# Patient Record
Sex: Female | Born: 1970
Health system: Southern US, Community
[De-identification: ages and names within clinical notes are randomized; demographics above are authoritative.]

## PROBLEM LIST (undated history)

## (undated) DIAGNOSIS — M797 Fibromyalgia: Secondary | ICD-10-CM

## (undated) DIAGNOSIS — D649 Anemia, unspecified: Secondary | ICD-10-CM

## (undated) DIAGNOSIS — K219 Gastro-esophageal reflux disease without esophagitis: Secondary | ICD-10-CM

## (undated) DIAGNOSIS — E785 Hyperlipidemia, unspecified: Secondary | ICD-10-CM

## (undated) DIAGNOSIS — IMO0002 Reserved for concepts with insufficient information to code with codable children: Secondary | ICD-10-CM

## (undated) DIAGNOSIS — Z9889 Other specified postprocedural states: Secondary | ICD-10-CM

## (undated) DIAGNOSIS — F502 Bulimia nervosa, unspecified: Secondary | ICD-10-CM

## (undated) DIAGNOSIS — R112 Nausea with vomiting, unspecified: Secondary | ICD-10-CM

## (undated) HISTORY — PX: NECK SURGERY: SHX720

## (undated) HISTORY — DX: Other specified postprocedural states: Z98.890

## (undated) HISTORY — DX: Hyperlipidemia, unspecified: E78.5

## (undated) HISTORY — PX: OTHER SURGICAL HISTORY: SHX169

## (undated) HISTORY — DX: Fibromyalgia: M79.7

## (undated) HISTORY — DX: Bulimia nervosa: F50.2

## (undated) HISTORY — PX: ROTATOR CUFF REPAIR: SHX139

## (undated) HISTORY — DX: Gastro-esophageal reflux disease without esophagitis: K21.9

## (undated) HISTORY — DX: Reserved for concepts with insufficient information to code with codable children: IMO0002

## (undated) HISTORY — DX: Anemia, unspecified: D64.9

## (undated) HISTORY — PX: ABDOMINAL HYSTERECTOMY: SHX81

## (undated) HISTORY — DX: Bulimia nervosa, unspecified: F50.20

## (undated) HISTORY — DX: Other specified postprocedural states: R11.2

## (undated) SURGERY — Surgical Case
Anesthesia: *Unknown

---

## 2003-01-17 ENCOUNTER — Encounter: Payer: Self-pay | Admitting: *Deleted

## 2003-01-17 ENCOUNTER — Encounter: Admission: RE | Admit: 2003-01-17 | Discharge: 2003-01-17 | Payer: Self-pay | Admitting: *Deleted

## 2003-06-17 ENCOUNTER — Ambulatory Visit (HOSPITAL_COMMUNITY): Admission: RE | Admit: 2003-06-17 | Discharge: 2003-06-17 | Payer: Self-pay | Admitting: Family Medicine

## 2003-07-08 ENCOUNTER — Ambulatory Visit (HOSPITAL_COMMUNITY): Admission: RE | Admit: 2003-07-08 | Discharge: 2003-07-08 | Payer: Self-pay | Admitting: Family Medicine

## 2003-08-07 ENCOUNTER — Ambulatory Visit (HOSPITAL_COMMUNITY): Admission: RE | Admit: 2003-08-07 | Discharge: 2003-08-07 | Payer: Self-pay | Admitting: Family Medicine

## 2004-01-21 ENCOUNTER — Encounter (INDEPENDENT_AMBULATORY_CARE_PROVIDER_SITE_OTHER): Payer: Self-pay | Admitting: Cardiology

## 2004-01-21 ENCOUNTER — Ambulatory Visit (HOSPITAL_COMMUNITY): Admission: RE | Admit: 2004-01-21 | Discharge: 2004-01-21 | Payer: Self-pay | Admitting: Cardiology

## 2004-02-02 ENCOUNTER — Ambulatory Visit (HOSPITAL_COMMUNITY): Admission: RE | Admit: 2004-02-02 | Discharge: 2004-02-02 | Payer: Self-pay | Admitting: Family Medicine

## 2004-03-19 ENCOUNTER — Ambulatory Visit (HOSPITAL_COMMUNITY): Admission: RE | Admit: 2004-03-19 | Discharge: 2004-03-19 | Payer: Self-pay | Admitting: Obstetrics and Gynecology

## 2004-08-17 ENCOUNTER — Other Ambulatory Visit: Admission: RE | Admit: 2004-08-17 | Discharge: 2004-08-17 | Payer: Self-pay | Admitting: Obstetrics and Gynecology

## 2005-03-08 ENCOUNTER — Encounter (INDEPENDENT_AMBULATORY_CARE_PROVIDER_SITE_OTHER): Payer: Self-pay | Admitting: *Deleted

## 2005-03-08 ENCOUNTER — Observation Stay (HOSPITAL_COMMUNITY): Admission: RE | Admit: 2005-03-08 | Discharge: 2005-03-09 | Payer: Self-pay | Admitting: Obstetrics and Gynecology

## 2007-02-27 ENCOUNTER — Ambulatory Visit: Payer: Self-pay | Admitting: Psychiatry

## 2007-03-27 ENCOUNTER — Ambulatory Visit: Payer: Self-pay | Admitting: Psychiatry

## 2007-05-10 ENCOUNTER — Ambulatory Visit: Payer: Self-pay | Admitting: Psychiatry

## 2007-05-16 ENCOUNTER — Ambulatory Visit: Payer: Self-pay | Admitting: Psychiatry

## 2007-06-06 ENCOUNTER — Ambulatory Visit: Payer: Self-pay | Admitting: Psychiatry

## 2007-06-28 ENCOUNTER — Ambulatory Visit: Payer: Self-pay | Admitting: Psychiatry

## 2007-07-19 ENCOUNTER — Ambulatory Visit: Payer: Self-pay | Admitting: Psychiatry

## 2007-08-01 ENCOUNTER — Ambulatory Visit: Payer: Self-pay | Admitting: Psychiatry

## 2007-08-16 HISTORY — PX: PAROTIDECTOMY: SUR1003

## 2007-09-06 ENCOUNTER — Ambulatory Visit: Payer: Self-pay | Admitting: Psychiatry

## 2007-10-26 ENCOUNTER — Ambulatory Visit (HOSPITAL_COMMUNITY): Admission: RE | Admit: 2007-10-26 | Discharge: 2007-10-26 | Payer: Self-pay | Admitting: Obstetrics and Gynecology

## 2007-11-21 ENCOUNTER — Ambulatory Visit (HOSPITAL_COMMUNITY): Admission: RE | Admit: 2007-11-21 | Discharge: 2007-11-21 | Payer: Self-pay | Admitting: *Deleted

## 2008-02-20 ENCOUNTER — Encounter: Admission: RE | Admit: 2008-02-20 | Discharge: 2008-02-20 | Payer: Self-pay | Admitting: Gastroenterology

## 2009-09-08 ENCOUNTER — Ambulatory Visit: Payer: Self-pay | Admitting: Professional

## 2009-09-15 ENCOUNTER — Ambulatory Visit: Payer: Self-pay | Admitting: Professional

## 2009-09-29 ENCOUNTER — Ambulatory Visit: Payer: Self-pay | Admitting: Professional

## 2009-12-04 ENCOUNTER — Encounter: Admission: RE | Admit: 2009-12-04 | Discharge: 2009-12-04 | Payer: Self-pay | Admitting: Orthopedic Surgery

## 2009-12-16 ENCOUNTER — Encounter: Payer: Self-pay | Admitting: Family Medicine

## 2010-01-15 ENCOUNTER — Encounter: Admission: RE | Admit: 2010-01-15 | Discharge: 2010-01-15 | Payer: Self-pay | Admitting: Orthopedic Surgery

## 2010-01-22 ENCOUNTER — Ambulatory Visit: Payer: Self-pay | Admitting: Family Medicine

## 2010-01-22 DIAGNOSIS — M5126 Other intervertebral disc displacement, lumbar region: Secondary | ICD-10-CM | POA: Insufficient documentation

## 2010-01-22 DIAGNOSIS — R5381 Other malaise: Secondary | ICD-10-CM | POA: Insufficient documentation

## 2010-01-22 DIAGNOSIS — Z8659 Personal history of other mental and behavioral disorders: Secondary | ICD-10-CM | POA: Insufficient documentation

## 2010-01-22 DIAGNOSIS — G4700 Insomnia, unspecified: Secondary | ICD-10-CM | POA: Insufficient documentation

## 2010-01-22 DIAGNOSIS — A6000 Herpesviral infection of urogenital system, unspecified: Secondary | ICD-10-CM | POA: Insufficient documentation

## 2010-01-22 DIAGNOSIS — K219 Gastro-esophageal reflux disease without esophagitis: Secondary | ICD-10-CM | POA: Insufficient documentation

## 2010-01-22 DIAGNOSIS — R5383 Other fatigue: Secondary | ICD-10-CM | POA: Insufficient documentation

## 2010-01-25 LAB — CONVERTED CEMR LAB
ALT: 19 units/L (ref 0–35)
AST: 16 units/L (ref 0–37)
Albumin: 3.6 g/dL (ref 3.5–5.2)
Alkaline Phosphatase: 73 units/L (ref 39–117)
BUN: 14 mg/dL (ref 6–23)
Basophils Absolute: 0 10*3/uL (ref 0.0–0.1)
Basophils Relative: 0.3 % (ref 0.0–3.0)
Bilirubin, Direct: 0.1 mg/dL (ref 0.0–0.3)
CO2: 32 meq/L (ref 19–32)
Calcium: 9.2 mg/dL (ref 8.4–10.5)
Chloride: 102 meq/L (ref 96–112)
Cholesterol: 275 mg/dL — ABNORMAL HIGH (ref 0–200)
Creatinine, Ser: 0.9 mg/dL (ref 0.4–1.2)
Direct LDL: 181.9 mg/dL
Eosinophils Absolute: 0.3 10*3/uL (ref 0.0–0.7)
Eosinophils Relative: 2.2 % (ref 0.0–5.0)
GFR calc non Af Amer: 76.18 mL/min (ref >60)
Glucose, Bld: 80 mg/dL (ref 70–99)
HCT: 38.3 % (ref 36.0–46.0)
HDL: 50.4 mg/dL (ref >39.00)
Hemoglobin: 13.6 g/dL (ref 12.0–15.0)
Lymphocytes Relative: 27.7 % (ref 12.0–46.0)
Lymphs Abs: 3.6 10*3/uL (ref 0.7–4.0)
MCHC: 35.4 g/dL (ref 30.0–36.0)
MCV: 93.2 fL (ref 78.0–100.0)
Monocytes Absolute: 0.8 10*3/uL (ref 0.1–1.0)
Monocytes Relative: 6.2 % (ref 3.0–12.0)
Neutro Abs: 8.2 10*3/uL — ABNORMAL HIGH (ref 1.4–7.7)
Neutrophils Relative %: 63.6 % (ref 43.0–77.0)
Platelets: 389 10*3/uL (ref 150.0–400.0)
Potassium: 4.3 meq/L (ref 3.5–5.1)
RBC: 4.11 M/uL (ref 3.87–5.11)
RDW: 12.5 % (ref 11.5–14.6)
Sodium: 141 meq/L (ref 135–145)
T3 Uptake Ratio: 39.5 % — ABNORMAL HIGH (ref 22.5–37.0)
T4, Total: 6.7 ug/dL (ref 5.0–12.5)
TSH: 0.73 microintl units/mL (ref 0.35–5.50)
Total Bilirubin: 0.4 mg/dL (ref 0.3–1.2)
Total CHOL/HDL Ratio: 5
Total Protein: 7 g/dL (ref 6.0–8.3)
Triglycerides: 302 mg/dL — ABNORMAL HIGH (ref 0.0–149.0)
VLDL: 60.4 mg/dL — ABNORMAL HIGH (ref 0.0–40.0)
WBC: 12.9 10*3/uL — ABNORMAL HIGH (ref 4.5–10.5)

## 2010-02-09 ENCOUNTER — Ambulatory Visit: Payer: Self-pay | Admitting: Family Medicine

## 2010-02-09 DIAGNOSIS — R55 Syncope and collapse: Secondary | ICD-10-CM | POA: Insufficient documentation

## 2010-02-09 DIAGNOSIS — J020 Streptococcal pharyngitis: Secondary | ICD-10-CM | POA: Insufficient documentation

## 2010-02-09 LAB — CONVERTED CEMR LAB: Rapid Strep: POSITIVE

## 2010-02-12 ENCOUNTER — Telehealth: Payer: Self-pay | Admitting: Family Medicine

## 2010-02-16 ENCOUNTER — Encounter: Payer: Self-pay | Admitting: Family Medicine

## 2010-02-26 ENCOUNTER — Encounter: Admission: RE | Admit: 2010-02-26 | Discharge: 2010-02-26 | Payer: Self-pay | Admitting: Orthopedic Surgery

## 2010-03-02 ENCOUNTER — Emergency Department (HOSPITAL_COMMUNITY): Admission: EM | Admit: 2010-03-02 | Discharge: 2010-03-03 | Payer: Self-pay | Admitting: Emergency Medicine

## 2010-05-05 ENCOUNTER — Ambulatory Visit: Payer: Self-pay | Admitting: Family Medicine

## 2010-05-05 DIAGNOSIS — E785 Hyperlipidemia, unspecified: Secondary | ICD-10-CM | POA: Insufficient documentation

## 2010-05-06 LAB — CONVERTED CEMR LAB
ALT: 16 units/L (ref 0–35)
AST: 19 units/L (ref 0–37)
Albumin: 3.6 g/dL (ref 3.5–5.2)
Alkaline Phosphatase: 72 units/L (ref 39–117)
Bilirubin, Direct: 0.1 mg/dL (ref 0.0–0.3)
Cholesterol: 247 mg/dL — ABNORMAL HIGH (ref 0–200)
Direct LDL: 183 mg/dL
HDL: 37.7 mg/dL — ABNORMAL LOW (ref >39.00)
Total Bilirubin: 0.4 mg/dL (ref 0.3–1.2)
Total CHOL/HDL Ratio: 7
Total Protein: 6.9 g/dL (ref 6.0–8.3)
Triglycerides: 251 mg/dL — ABNORMAL HIGH (ref 0.0–149.0)
VLDL: 50.2 mg/dL — ABNORMAL HIGH (ref 0.0–40.0)

## 2010-05-10 ENCOUNTER — Ambulatory Visit: Payer: Self-pay | Admitting: Family Medicine

## 2010-05-12 ENCOUNTER — Encounter: Payer: Self-pay | Admitting: Family Medicine

## 2010-05-12 LAB — CONVERTED CEMR LAB
BUN: 11 mg/dL (ref 6–23)
CO2: 30 meq/L (ref 19–32)
Calcium: 9.2 mg/dL (ref 8.4–10.5)
Chloride: 105 meq/L (ref 96–112)
Creatinine, Ser: 0.8 mg/dL (ref 0.4–1.2)
GFR calc non Af Amer: 83.69 mL/min (ref >60)
Glucose, Bld: 85 mg/dL (ref 70–99)
Hgb A1c MFr Bld: 6 % (ref 4.6–6.5)
Potassium: 4.2 meq/L (ref 3.5–5.1)
Sodium: 141 meq/L (ref 135–145)

## 2010-06-01 ENCOUNTER — Ambulatory Visit (HOSPITAL_COMMUNITY)
Admission: RE | Admit: 2010-06-01 | Discharge: 2010-06-01 | Payer: Self-pay | Source: Home / Self Care | Admitting: Obstetrics and Gynecology

## 2010-07-19 ENCOUNTER — Telehealth: Payer: Self-pay | Admitting: Family Medicine

## 2010-08-18 ENCOUNTER — Telehealth: Payer: Self-pay | Admitting: Family Medicine

## 2010-08-25 ENCOUNTER — Telehealth: Payer: Self-pay | Admitting: Family Medicine

## 2010-09-14 NOTE — Miscellaneous (Signed)
Summary: Diflucan 150mg  med list update  Clinical Lists Changes  Medications: Added new medication of DIFLUCAN 150 MG TABS (FLUCONAZOLE) Take one tablet by mouth times one     Current Allergies: ! CODEINE

## 2010-09-14 NOTE — Progress Notes (Signed)
Summary: clonazepam/ambien  Phone Note Refill Request Message from:  Fax from Pharmacy on July 19, 2010 10:13 AM  Refills Requested: Medication #1:  CLONAZEPAM 0.5 MG TABS take one tablet at bedtime   Last Refilled: 1971/07/15  Medication #2:  AMBIEN CR 12.5 MG CR-TABS take one tablet at bedtime   Last Refilled: 06/19/2010 [facility] [phone].     Initial call taken by: Melody Comas,  July 19, 2010 10:13 AM  Follow-up for Phone Call        Rx's called to pharmacy. Follow-up by: Linde Gillis CMA Duncan Dull),  July 19, 2010 10:18 AM    Prescriptions: AMBIEN CR 12.5 MG CR-TABS (ZOLPIDEM TARTRATE) take one tablet at bedtime  #30 x 0   Entered and Authorized by:   Ruthe Mannan MD   Signed by:   Ruthe Mannan MD on 07/19/2010   Method used:   Telephoned to ...         RxID:   [id number] CLONAZEPAM 0.5 MG TABS (CLONAZEPAM) take one tablet at bedtime  #30 x 0   Entered and Authorized by:   Ruthe Mannan MD   Signed by:   Ruthe Mannan MD on 07/19/2010   Method used:   Telephoned to ...         RxID:   [id number]

## 2010-09-14 NOTE — Assessment & Plan Note (Signed)
Summary: new patient/rbh   Vital Signs:  Patient profile:   40 year old female Height:      63.50 inches Weight:      180 pounds BMI:     31.50 Temp:     98.4 degrees F oral Pulse rate:   72 / minute Pulse rhythm:   regular BP sitting:   124 / 82  (right arm) Cuff size:   regular  Vitals Entered By: Linde Gillis CMA Duncan Dull) (January 31, 2010 1:51 PM) CC: new patient   History of Present Illness: 39 yo here to establish care.  Fatigue- has been a chronic issue for her but over past several months, getting worse.  Avid softball player and lately cannot make it through the games.  No SOB, CP, palpiations or dizziness. She just feels tired.  Has a h/o fibromyalgia but her fatigue typically isn't as evident with activity like it has been lately. Notices that she is gaining weight, temperature is "off," sometimes very hot or cold. No constipation, diarrhea orther symptoms of hypo or hyperthyroidism.  FIbromyalgia- diagnosed 5 years ago.  Has been on Lyrica, Cymbalta, and most recently Sevella.  Nothing really helps other than exercise.  Bulging disc T7-T8- receiving cortisol injections by Dr. Charlett Blake [facility]). Not currently having radiculopathy. Also taking Tramadol 50 mg every 6 hours as needed.  Insomnia- chronic issue.  Difficulty falling and staying asleep.  Taking Ambien 12.5 mg and Clonazepma 0.5 mg at bedtime for years.    GERD- had severe esophagitis from years of bulimia.  Has not had a relapse of bulemia in over 20 years.  Still taking Lansoprazole 30 mg before meals.  Well woman- had a mammogram in 2008 because she asked for one after her mom died, she wanted everything checked out.  It was normal.  Aunt had breast CA at 54.  Preventive Screening-Counseling & Management  Alcohol-Tobacco     Smoking Status: never  Caffeine-Diet-Exercise     Does Patient Exercise: yes  Current Medications (verified): 1)  Clonazepam 0.5 Mg Tabs (Clonazepam) .... Take One Tablet At Bedtime 2)   Cyclobenzaprine Hcl 10 Mg Tabs (Cyclobenzaprine Hcl) .... Take One Tablet At Bedtime 3)  Valacyclovir Hcl 500 Mg Tabs (Valacyclovir Hcl) .... Take One Tablet By Mouth Two Times A Day As Needed 4)  Tramadol Hcl 50 Mg Tabs (Tramadol Hcl) .... Take One Tablet By Mouth Every Six Hours As Needed For Pain 5)  Ambien Cr 12.5 Mg Cr-Tabs (Zolpidem Tartrate) .... Take One Tablet At Bedtime 6)  Lansoprazole 30 Mg Tbdp (Lansoprazole) .... Take One Tablet By Mouth Before Meals Every Day 7)  Vitamin B-12 500 Mcg Tabs (Cyanocobalamin) .... Take One To Three Tablet By Mouth As Needed 8)  Corvalen Powder .... Mix With Liquid Once Daily  Allergies (verified): 1)  ! 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 Corliss Skains) Genital herpes h/o bulemia Bulging disc T7/8- receiving steroid injections (Dr. Charlett Blake)  Past Surgical History: Caesarean section Hysterectomy - endometriosis Rotator cuff repair Parotidectomy- benign tumor 2009  Family History: Reviewed history and no changes required. Mom died  of Lung CA at 29 Dad alive, h/o CVA Maternal aunt- breast CA at 92  Social History: Reviewed history and no changes required. 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 Eyes:  Denies blurring. ENT:  Denies  difficulty swallowing. CV:  Denies chest pain or discomfort. Resp:  Denies shortness of breath. GI:  Denies abdominal pain, bloody stools, and change in bowel habits. GU:  Denies hematuria. MS:  Complains of low back pain and muscle aches; denies joint pain, joint redness, and joint swelling. Derm:  Denies rash. Neuro:  Denies headaches, visual disturbances, and weakness. Psych:  Denies anxiety and depression. Endo:  Complains of cold intolerance, heat intolerance, and weight change. Heme:  Denies abnormal bruising and bleeding.  Physical Exam  General:  Well-developed,well-nourished,in no acute distress; alert,appropriate and cooperative throughout examination Head:  normocephalic, atraumatic, and no abnormalities observed.   Eyes:  vision grossly intact, pupils equal, pupils round, and pupils reactive to light.   Ears:  R ear normal and L ear normal.   Nose:  no external deformity.   Mouth:  good dentition.   Lungs:  Normal respiratory effort, chest expands symmetrically. Lungs are clear to auscultation, no crackles or wheezes. Heart:  Normal rate and regular rhythm. S1 and S2 normal without gallop, murmur, click, rub or other extra sounds. Abdomen:  Bowel sounds positive,abdomen soft and non-tender without masses, organomegaly or hernias noted. Msk:  No deformity or scoliosis noted of thoracic or lumbar spine.   Extremities:  No clubbing, cyanosis, edema, or deformity noted with normal full range of motion of all joints.   Neurologic:  No cranial nerve deficits noted. Station and gait are normal. Plantar reflexes are down-going bilaterally. DTRs are symmetrical throughout. Sensory, motor and coordinative functions appear intact. Skin:  Intact without suspicious lesions or rashes Psych:  Cognition and judgment appear intact. Alert and cooperative with normal attention span and concentration. No apparent delusions, illusions, hallucinations   Impression & Recommendations:  Problem #  1:  HERNIATED LUMBOSACRAL DISC (ICD-722.10) Assessment Unchanged Followed by Dr. Charlett Blake, receiving injections.  Problem # 2:  GENITAL HERPES (ICD-054.10) Assessment: Unchanged Currently in remission.  Valtrex 500 mg two times a day prn  Problem # 3:  INSOMNIA, CHRONIC (ICD-307.42) Assessment: Unchanged Chronic issue.  Will continue with her meds for now, will work on sleep hygeine with her to wean off benzo and Ambien.  Problem # 4:  FATIGUE (ICD-780.79) Assessment: Deteriorated Acutely worsening.  Will check lab work today to rule out most common reversible causes but may also be due to her fibromyalgia.   Orders: Venipuncture ([address]109) TLB-TSH (Thyroid Stimulating Hormone) (84443-TSH) TLB-CBC Platelet - w/Differential (85025-CBCD) TLB-BMP (Basic Metabolic Panel-BMET) (80048-METABOL) TLB-Hepatic/Liver Function Pnl (80076-HEPATIC) TLB-Lipid Panel (80061-LIPID) TLB-T4 (Thyrox), Total (84436-T4) TLB-T3 Uptake (60454-U9WJ)  Problem # 5:  FIBROMYALGIA (ICD-729.1) Assessment: Deteriorated Discussed Vit D, manganese supplementation.  She is using some supplementation currently. Encouraged to continue with physical activity. Her updated medication list for this problem includes:    Cyclobenzaprine Hcl 10 Mg Tabs (Cyclobenzaprine hcl) .Marland Kitchen... Take one tablet at bedtime    Tramadol Hcl 50 Mg Tabs (Tramadol hcl) .Marland Kitchen... Take one tablet by mouth every six hours as needed for pain  Complete Medication List: 1)  Clonazepam 0.5 Mg Tabs (Clonazepam) .... Take one tablet at bedtime 2)  Cyclobenzaprine Hcl 10 Mg Tabs (Cyclobenzaprine hcl) .... Take one tablet at bedtime 3)  Valacyclovir Hcl 500 Mg Tabs (Valacyclovir hcl) .... Take one tablet by mouth two times a day as needed 4)  Tramadol Hcl 50 Mg Tabs (Tramadol hcl) .... Take one tablet by mouth every six hours as needed for pain 5)  Ambien Cr 12.5 Mg Cr-tabs (Zolpidem tartrate) .... Take one tablet at bedtime 6)  Lansoprazole 30 Mg Tbdp  (Lansoprazole) .... Take one tablet by mouth before meals every day 7)  Vitamin B-12 500 Mcg Tabs (Cyanocobalamin) .... Take one to three tablet by mouth as needed 8)  Corvalen Powder  .... Mix with liquid once daily  Patient Instructions: 1)  Very nice to meet you, Ms. Adin Hector. 2)  We will send you to the lab and check TSH, T3/T4, CBC, BMET (780.79), lipid panel, hepatic panel (V77.91). 3)  We willl call you with in the next few days with your lab results.  Current Allergies (reviewed today): ! CODEINE

## 2010-09-14 NOTE — Assessment & Plan Note (Signed)
Summary: TALK ABOUT CHOLESTERAOL MEDS/DLO   Vital Signs:  Patient profile:   40 year old female Height:      63.50 inches Weight:      179 pounds BMI:     31.32 Temp:     98.3 degrees F oral Pulse rate:   76 / minute Pulse rhythm:   regular BP sitting:   122 / 88  (left arm) Cuff size:   regular  Vitals Entered By: Linde Gillis CMA Duncan Dull) (May 10, 2010 2:49 PM) CC: talk about cholesterol medication   History of Present Illness: 40 yo here to discuss cholesterol meds.  Checked lipids in June and they were elevated.  Has been working on diet and lifestyle changes since but cholesterol remains elevated. Very strong FH of HLD and DM.  TG 251 (were 302), HDL 37, LDL 183 ( was 181).  Has noticed lately that she is more thirsty, dry mouth (h/o parotidectomy), urinating more frequently.  CBG was normal in June.    Current Medications (verified): 1)  Clonazepam 0.5 Mg Tabs (Clonazepam) .... Take One Tablet At Bedtime 2)  Cyclobenzaprine Hcl 10 Mg Tabs (Cyclobenzaprine Hcl) .... Take One Tablet At Bedtime 3)  Valacyclovir Hcl 500 Mg Tabs (Valacyclovir Hcl) .... Take One Tablet By Mouth Two Times A Day As Needed 4)  Tramadol Hcl 50 Mg Tabs (Tramadol Hcl) .... Take One Tablet By Mouth Every Six Hours As Needed For Pain 5)  Ambien Cr 12.5 Mg Cr-Tabs (Zolpidem Tartrate) .... Take One Tablet At Bedtime 6)  Lansoprazole 30 Mg Tbdp (Lansoprazole) .... Take One Tablet By Mouth Before Breakfast Every Day 7)  Vitamin B-12 500 Mcg Tabs (Cyanocobalamin) .... Take One To Three Tablet By Mouth As Needed 8)  Corvalen Powder .... Mix With Liquid Once Daily 9)  Simvastatin 10 Mg Tabs (Simvastatin) .Marland Kitchen.. 1 By Mouth At Bedtime  Allergies: 1)  ! Codeine  Past History:  Past Medical History: Last updated: Feb 01, 2010 Fibromyalgia (Dr. Corliss Skains) Genital herpes h/o bulemia Bulging disc T7/8- receiving steroid injections (Dr. 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 Endo:  Complains of excessive thirst, excessive urination, and polyuria.  Physical Exam  General:  fatigued appearing Psych:  normal affect, talkative and pleasant    Impression & Recommendations:  Problem # 1:  HYPERLIPIDEMIA (ICD-272.4) Assessment New Time spent with patient 25 minutes, more than 50% of this time was spent counseling patient on hyperlipemia.  Pattern very consistent with genetic influence.  Will start Simvastatin 10 mg daily (pt is s/p hysterectomy). Repeat labs in 6-8 weeks.  Her updated medication list for this problem includes:    Simvastatin 10 Mg Tabs (Simvastatin) .Marland Kitchen... 1 by mouth at bedtime  Problem # 2:  R/O DM (ICD-250.00) Assessment: New Given increased thirst and urination, will check a1c and BMET.  May be due to parotidectomy but given increased TG, need to r/o DM. Orders: Venipuncture (27253)  TLB-A1C / Hgb A1C (Glycohemoglobin) (83036-A1C) TLB-BMP (Basic Metabolic Panel-BMET) (80048-METABOL)  Complete Medication List: 1)  Clonazepam 0.5 Mg Tabs (Clonazepam) .... Take one tablet at bedtime 2)  Cyclobenzaprine Hcl 10 Mg Tabs (Cyclobenzaprine hcl) .... Take one tablet at bedtime 3)  Valacyclovir Hcl 500 Mg Tabs (Valacyclovir hcl) .... Take one tablet by mouth two times a day as needed 4)  Tramadol Hcl 50 Mg Tabs (Tramadol hcl) .... Take one tablet  by mouth every six hours as needed for pain 5)  Ambien Cr 12.5 Mg Cr-tabs (Zolpidem tartrate) .... Take one tablet at bedtime 6)  Lansoprazole 30 Mg Tbdp (Lansoprazole) .... Take one tablet by mouth before breakfast every day 7)  Vitamin B-12 500 Mcg Tabs (Cyanocobalamin) .... Take one to three tablet by mouth as needed 8)  Corvalen Powder  .... Mix with liquid once daily 9)  Simvastatin 10 Mg Tabs (Simvastatin) .Marland Kitchen.. 1 by mouth at bedtime  Patient Instructions: 1)  please make a lab visit in 6-8 weeks - fasting lipid panel, hepatic panel (272.4) Prescriptions: SIMVASTATIN 10 MG TABS (SIMVASTATIN) 1 by mouth at bedtime  #90 x 3   Entered and Authorized by:   Ruthe Mannan MD   Signed by:   Ruthe Mannan MD on 05/10/2010   Method used:   [facility]        [facility] [id number]       [address]       [place], [place]  [address]       Ph: [phone] or [phone]       Fax: [phone]   RxID:   [id number] TRAMADOL HCL 50 MG TABS (TRAMADOL HCL) take one tablet by mouth every six hours as needed for pain  #120 x 0   Entered and Authorized by:   Ruthe Mannan MD   Signed by:   Ruthe Mannan MD on 05/10/2010   Method used:   Print then Give to Patient   RxID:   [id number] AMBIEN CR 12.5 MG CR-TABS (ZOLPIDEM TARTRATE) take one tablet at bedtime  #30 x 0   Entered and Authorized by:   Ruthe Mannan MD   Signed by:   Ruthe Mannan MD on 05/10/2010   Method used:   Print then Give to Patient   RxID:   [id number] CYCLOBENZAPRINE HCL 10 MG TABS (CYCLOBENZAPRINE HCL) take one tablet at bedtime  #30 x 6   Entered and Authorized by:   Ruthe Mannan MD   Signed by:   Ruthe Mannan MD on 05/10/2010   Method used:   Print then Give to Patient   RxID:   [id number] CLONAZEPAM 0.5 MG TABS (CLONAZEPAM) take one tablet at bedtime  #30 x 0   Entered and Authorized by:   Ruthe Mannan MD   Signed by:   Ruthe Mannan MD on 05/10/2010   Method used:   Print then Give to Patient    RxID:   [id number]   Current Allergies (reviewed today): ! CODEINE

## 2010-09-14 NOTE — Letter (Signed)
Summary: Records from [facility] 2008 - 2011  Records from [facility] 2008 - 2011   Imported By: Maryln Gottron 02/05/2010 15:33:35  _____________________________________________________________________  External Attachment:    Type:   Image     Comment:   External Document

## 2010-09-14 NOTE — Miscellaneous (Signed)
Summary: Controlled Substance Agreement  Controlled Substance Agreement   Imported By: Lanelle Bal 01/28/2010 11:51:02  _____________________________________________________________________  External Attachment:    Type:   Image     Comment:   External Document

## 2010-09-14 NOTE — Progress Notes (Signed)
Summary: Yeast infection  Phone Note Call from Patient Call back at Home Phone [phone]   Caller: Patient Call For: Dr. Milinda Antis Summary of Call: Was given antibiotics earlier this week and is now getting a yeast infection.  Advised patient to use Monistat while taking ABX but she asks if Diflucan can be called in to her pharmacy to use after finishing ABX?  [facility] Initial call taken by: Delilah Shan CMA Duncan Dull),  February 12, 2010 4:45 PM  Follow-up for Phone Call        please call in diflucan 150 mg 1 by mouth times one #1 no ref  f/u if not improved  Follow-up by: Judith Part MD,  February 12, 2010 4:53 PM  Additional Follow-up for Phone Call Additional follow up Details #1::        [facility]. Patient notified as instructed by telephone. Cannot add med to med list until 02/09/10 note signed. Will hold this note on my desk top to add med to med list at later time.Lewanda Rife LPN  February 12, 4402 5:19 PM   med list updated.Lewanda Rife LPN  February 16, 4741 10:00 AM

## 2010-09-14 NOTE — Assessment & Plan Note (Signed)
Summary: SORE THROAT & EAR PAIN / LFW   Vital Signs:  Patient profile:   40 year old female Height:      63.50 inches Weight:      179.25 pounds BMI:     31.37 Temp:     98.6 degrees F oral Pulse rate:   80 / minute Pulse rhythm:   regular BP sitting:   108 / 70  (left arm) Cuff size:   regular  Vitals Entered By: Lewanda Rife LPN (February 09, 2010 3:40 PM) CC: sorethroat, throat feels swollen and both earsache   History of Present Illness: just got back from trip to [place] -- and was exposed to everything  did not sleep a lot , got altitude sick   sore throat started sunday  fever - low grade -- was hot natured  cannot eat due to sore throat  also some nausea   no nasal symptoms or cough  no rash  no new joint pain     Allergies: 1)  ! Codeine  Past History:  Past Medical History: Last updated: 02/09/10 Fibromyalgia (Dr. Corliss Skains) Genital herpes h/o bulemia Bulging disc T7/8- receiving steroid injections (Dr. 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 Eyes:  Denies blurring, discharge, and eye irritation. ENT:  Complains of sore throat; denies nasal congestion and sinus pressure. CV:  Denies chest pain or discomfort and palpitations. Resp:  Denies cough and shortness of breath. GI:  Denies diarrhea and vomiting. MS:  Denies joint pain. Derm:   Denies rash.  Physical Exam  General:  fatigued appearing Head:  normocephalic, atraumatic, and no abnormalities observed.   Eyes:  vision grossly intact, pupils equal, pupils round, pupils reactive to light, and no injection.   Ears:  R ear normal and L ear normal.   Nose:  no nasal discharge.   Mouth:  brightly erythematous throat with scant exudate no swelling   Neck:  No deformities, masses, or tenderness noted. Lungs:  Normal respiratory effort, chest expands symmetrically. Lungs are clear to auscultation, no crackles or wheezes. Heart:  Normal rate and regular rhythm. S1 and S2 normal without gallop, murmur, click, rub or other extra sounds. Msk:  no acute joint changes  Skin:  Intact without suspicious lesions or rashes Cervical Nodes:  No lymphadenopathy noted Psych:  normal affect, talkative and pleasant    Impression & Recommendations:  Problem # 1:  STREP THROAT (ICD-034.0) Assessment New  with sore throat and malaise and pos rapid strep tx with amox  fluids/ rest recommend sympt care- see pt instructions  pt advised to update me if symptoms worsen or do not improve - esp if unable to swallow or high fever Her updated medication list for this problem includes:    Amoxicillin 500 Mg Caps (Amoxicillin) .Marland Kitchen... 1 by mouth three times a day for 10 days  Orders: Prescription Created Electronically [phone]) Rapid Strep (78469)  Complete Medication List: 1)  Clonazepam 0.5 Mg Tabs (  Clonazepam) .... Take one tablet at bedtime 2)  Cyclobenzaprine Hcl 10 Mg Tabs (Cyclobenzaprine hcl) .... Take one tablet at bedtime 3)  Valacyclovir Hcl 500 Mg Tabs (Valacyclovir hcl) .... Take one tablet by mouth two times a day as needed 4)  Tramadol Hcl 50 Mg Tabs (Tramadol hcl) .... Take one tablet by mouth every six hours as needed for pain 5)  Ambien Cr 12.5 Mg Cr-tabs (Zolpidem tartrate) .... Take one tablet at bedtime 6)  Lansoprazole 30 Mg Tbdp (Lansoprazole) .... Take one tablet by  mouth before breakfast every day 7)  Vitamin B-12 500 Mcg Tabs (Cyanocobalamin) .... Take one to three tablet by mouth as needed 8)  Corvalen Powder  .... Mix with liquid once daily 9)  Dayquil Multi-symptom 30-325-10 Mg/22ml Liqd (Pseudoephedrine-apap-dm) .... Otc as directed. 10)  Amoxicillin 500 Mg Caps (Amoxicillin) .Marland Kitchen.. 1 by mouth three times a day for 10 days  Patient Instructions: 1)  try tylenol and chloraseptic throat spray for pain  2)  sip cold fluids/ try popcicles  3)  take amoxicillin as directed  4)  update me if you are worse or not improved in 2-3 days  Prescriptions: AMOXICILLIN 500 MG CAPS (AMOXICILLIN) 1 by mouth three times a day for 10 days  #30 x 0   Entered and Authorized by:   Judith Part MD   Signed by:   Judith Part MD on 02/09/2010   Method used:   [facility]        [facility] [id number]       [address]       [place], [place]  [address]       Ph: [phone] or [phone]       Fax: [phone]   RxID:   [id number]   Current Allergies (reviewed today): ! CODEINE  Laboratory Results  Date/Time Received: February 09, 2010 3:42 PM  Date/Time Reported: February 09, 2010 3:42 PM   Other Tests  Rapid Strep: positive  Kit Test Internal QC: Positive   (Normal Range: Negative)

## 2010-09-14 NOTE — Letter (Signed)
Summary: Generic Letter  [facility]  [address] [place], [place] [address]   Phone: [phone]  Fax: [phone]    05/12/2010  CAMMY SANJURJO [address] [place], [place]  [address]  Dear Ms. MCBRIDE,   We have received your lab results.  Dr. Dayton Martes says that your Hemoglobin A1C and Basic Metabolic Panel (BMET)are normal.  No signs of diabetes.  Enclosed you will find a copy of your lab results.        Sincerely,       Linde Gillis CMA (AAMA)for  Dr. Ruthe Mannan

## 2010-09-16 ENCOUNTER — Telehealth: Payer: Self-pay | Admitting: Family Medicine

## 2010-09-16 NOTE — Progress Notes (Signed)
Summary: [name]  Phone Note Refill Request Message from:  Fax from Pharmacy on August 18, 2010 9:36 AM  Refills Requested: Medication #1:  AMBIEN CR 12.5 MG CR-TABS take one tablet at bedtime   Last Refilled: 07/19/2010 [phone].   Initial call taken by: Melody Comas,  August 18, 2010 9:36 AM  Follow-up for Phone Call        Rx called to pharmacy Follow-up by: Linde Gillis CMA Duncan Dull),  August 18, 2010 10:31 AM    Prescriptions: AMBIEN CR 12.5 MG CR-TABS (ZOLPIDEM TARTRATE) take one tablet at bedtime  #30 x 0   Entered and Authorized by:   Ruthe Mannan MD   Signed by:   Ruthe Mannan MD on 08/18/2010   Method used:   Telephoned to ...         RxID:   [id number]

## 2010-09-16 NOTE — Progress Notes (Signed)
Summary: clonazepam  Phone Note Refill Request Message from:  Fax from Pharmacy on August 25, 2010 4:54 PM  Refills Requested: Medication #1:  CLONAZEPAM 0.5 MG TABS take one tablet at bedtime   Last Refilled: 07/19/2010  Medication #2:  TRAMADOL HCL 50 MG TABS take one tablet by mouth every six hours as needed for pain   Last Refilled: 05/12/2010 Refill request from [facility]. [phone]  Initial call taken by: Melody Comas,  August 25, 2010 4:54 PM  Follow-up for Phone Call        Rx's called to pharmacy. Follow-up by: Linde Gillis CMA [facility]),  August 26, 2010 7:58 AM    Prescriptions: TRAMADOL HCL 50 MG TABS (TRAMADOL HCL) take one tablet by mouth every six hours as needed for pain  #120 x 0   Entered and Authorized by:   Ruthe Mannan MD   Signed by:   Ruthe Mannan MD on 08/26/2010   Method used:   Telephoned to ...         RxID:   [id number] CLONAZEPAM 0.5 MG TABS (CLONAZEPAM) take one tablet at bedtime  #30 x 0   Entered and Authorized by:   Ruthe Mannan MD   Signed by:   Ruthe Mannan MD on 08/26/2010   Method used:   Telephoned to ...         RxID:   [id number]

## 2010-09-22 NOTE — Progress Notes (Signed)
Summary: [name]  Phone Note Refill Request Message from:  Fax from Pharmacy on September 16, 2010 9:18 AM  Refills Requested: Medication #1:  AMBIEN CR 12.5 MG CR-TABS take one tablet at bedtime   Last Refilled: 08/18/2010 [facility]ill [facility] from [facility]. [phone]  Initial call taken by: Melody Comas,  September 16, 2010 9:18 AM  Follow-up for Phone Call        Rx called to pharmacy Follow-up by: Linde Gillis CMA [facility]),  September 16, 2010 9:53 AM    Prescriptions: AMBIEN CR 12.5 MG CR-TABS (ZOLPIDEM TARTRATE) take one tablet at bedtime  #30 x 0   Entered and Authorized by:   Ruthe Mannan MD   Signed by:   Ruthe Mannan MD on 09/16/2010   Method used:   Telephoned to ...         RxID:   [id number]

## 2010-09-23 ENCOUNTER — Telehealth: Payer: Self-pay | Admitting: Family Medicine

## 2010-09-30 NOTE — Progress Notes (Signed)
Summary: clonazepam  Phone Note Refill Request Message from:  Fax from Pharmacy on September 23, 2010 2:16 PM  Refills Requested: Medication #1:  CLONAZEPAM 0.5 MG TABS take one tablet at bedtime   Last Refilled: 08/26/2010 [facility] [phone]  Initial call taken by: Melody Comas,  September 23, 2010 2:17 PM  Follow-up for Phone Call        done.  plz call in. Follow-up by: Eustaquio Boyden  MD,  September 23, 2010 2:48 PM  Additional Follow-up for Phone Call Additional follow up Details #1::        Rx called to pharmacy. Additional Follow-up by: Linde Gillis CMA [facility]),  September 23, 2010 3:06 PM    Prescriptions: CLONAZEPAM 0.5 MG TABS (CLONAZEPAM) take one tablet at bedtime  #30 x 0   Entered and Authorized by:   Eustaquio Boyden  MD   Signed by:   Linde Gillis CMA (AAMA) on 09/23/2010   Method used:   Telephoned to ...         RxID:   [id number]

## 2010-10-04 ENCOUNTER — Ambulatory Visit (INDEPENDENT_AMBULATORY_CARE_PROVIDER_SITE_OTHER): Payer: BC Managed Care – PPO | Admitting: Psychiatry

## 2010-10-04 DIAGNOSIS — F4322 Adjustment disorder with anxiety: Secondary | ICD-10-CM

## 2010-10-11 ENCOUNTER — Ambulatory Visit (INDEPENDENT_AMBULATORY_CARE_PROVIDER_SITE_OTHER): Payer: BC Managed Care – PPO | Admitting: Psychiatry

## 2010-10-11 DIAGNOSIS — F4322 Adjustment disorder with anxiety: Secondary | ICD-10-CM

## 2010-10-18 ENCOUNTER — Telehealth: Payer: Self-pay | Admitting: Family Medicine

## 2010-10-21 ENCOUNTER — Ambulatory Visit: Payer: BC Managed Care – PPO | Admitting: Psychiatry

## 2010-10-25 ENCOUNTER — Telehealth: Payer: Self-pay | Admitting: Family Medicine

## 2010-10-26 NOTE — Progress Notes (Signed)
Summary: [name]  Phone Note Refill Request Message from:  Patient on October 18, 2010 10:27 AM  Refills Requested: Medication #1:  AMBIEN CR 12.5 MG CR-TABS take one tablet at bedtime   Last Refilled: 09/16/2010 [facility] from [facility]. [phone]  Initial call taken by: Melody Comas,  October 18, 2010 10:27 AM  Follow-up for Phone Call        Rx called to pharmacy Follow-up by: Linde Gillis CMA [facility]),  October 18, 2010 10:31 AM    Prescriptions: AMBIEN CR 12.5 MG CR-TABS (ZOLPIDEM TARTRATE) take one tablet at bedtime  #30 x 0   Entered and Authorized by:   Ruthe Mannan MD   Signed by:   Ruthe Mannan MD on 10/18/2010   Method used:   Telephoned to ...         RxID:   [id number]

## 2010-10-27 LAB — CBC
HCT: 41.3 % (ref 36.0–46.0)
Hemoglobin: 14.4 g/dL (ref 12.0–15.0)
MCH: 32.2 pg (ref 26.0–34.0)
MCHC: 34.8 g/dL (ref 30.0–36.0)
MCV: 92.7 fL (ref 78.0–100.0)
Platelets: 388 10*3/uL (ref 150–400)
RBC: 4.45 MIL/uL (ref 3.87–5.11)
RDW: 11.9 % (ref 11.5–15.5)
WBC: 10.6 10*3/uL — ABNORMAL HIGH (ref 4.0–10.5)

## 2010-10-27 LAB — SURGICAL PCR SCREEN
MRSA, PCR: NEGATIVE
Staphylococcus aureus: NEGATIVE

## 2010-11-02 NOTE — Progress Notes (Signed)
Summary: clonazepam   Phone Note Refill Request Message from:  Fax from Pharmacy on October 25, 2010 4:32 PM  Refills Requested: Medication #1:  CLONAZEPAM 0.5 MG TABS take one tablet at bedtime   Last Refilled: 09/23/2010 [facility] from [facility]. [phone]  Initial call taken by: Melody Comas,  October 25, 2010 4:58 PM  Follow-up for Phone Call        Rx called to pharmacy Follow-up by: Linde Gillis CMA [facility]),  October 26, 2010 8:03 AM    Prescriptions: CLONAZEPAM 0.5 MG TABS (CLONAZEPAM) take one tablet at bedtime  #30 x 0   Entered and Authorized by:   Ruthe Mannan MD   Signed by:   Ruthe Mannan MD on 10/26/2010   Method used:   Telephoned to ...         RxID:   [id number]

## 2010-11-08 ENCOUNTER — Ambulatory Visit (INDEPENDENT_AMBULATORY_CARE_PROVIDER_SITE_OTHER): Payer: BC Managed Care – PPO | Admitting: Psychiatry

## 2010-11-08 DIAGNOSIS — F4322 Adjustment disorder with anxiety: Secondary | ICD-10-CM

## 2010-11-22 ENCOUNTER — Ambulatory Visit (INDEPENDENT_AMBULATORY_CARE_PROVIDER_SITE_OTHER): Payer: BC Managed Care – PPO | Admitting: Psychiatry

## 2010-11-22 DIAGNOSIS — F4322 Adjustment disorder with anxiety: Secondary | ICD-10-CM

## 2010-11-23 ENCOUNTER — Other Ambulatory Visit: Payer: Self-pay | Admitting: *Deleted

## 2010-11-23 MED ORDER — CLONAZEPAM 0.5 MG PO TABS: ORAL_TABLET | ORAL | Status: DC

## 2010-11-23 MED ORDER — ZOLPIDEM TARTRATE ER 12.5 MG PO TBCR: 12.5000 mg | EXTENDED_RELEASE_TABLET | Freq: Every evening | ORAL | Status: DC | PRN

## 2010-11-23 NOTE — Telephone Encounter (Signed)
Rx faxed to [facility].  

## 2010-12-06 ENCOUNTER — Ambulatory Visit: Payer: BC Managed Care – PPO | Admitting: Psychiatry

## 2010-12-27 ENCOUNTER — Other Ambulatory Visit: Payer: Self-pay | Admitting: *Deleted

## 2010-12-27 MED ORDER — ZOLPIDEM TARTRATE ER 12.5 MG PO TBCR: 12.5000 mg | EXTENDED_RELEASE_TABLET | Freq: Every evening | ORAL | Status: DC | PRN

## 2010-12-27 MED ORDER — CLONAZEPAM 0.5 MG PO TABS: ORAL_TABLET | ORAL | Status: DC

## 2010-12-27 NOTE — Telephone Encounter (Signed)
[facility] 

## 2010-12-28 ENCOUNTER — Ambulatory Visit (INDEPENDENT_AMBULATORY_CARE_PROVIDER_SITE_OTHER): Payer: BC Managed Care – PPO | Admitting: Family Medicine

## 2010-12-28 ENCOUNTER — Encounter: Payer: Self-pay | Admitting: Family Medicine

## 2010-12-28 VITALS — BP 120/84 | HR 80 | Temp 98.7°F | Ht 63.5 in | Wt 168.0 lb

## 2010-12-28 DIAGNOSIS — J020 Streptococcal pharyngitis: Secondary | ICD-10-CM

## 2010-12-28 DIAGNOSIS — J029 Acute pharyngitis, unspecified: Secondary | ICD-10-CM

## 2010-12-28 LAB — POCT RAPID STREP A (OFFICE): Rapid Strep A Screen: POSITIVE — AB

## 2010-12-28 MED ORDER — AMOXICILLIN 875 MG PO TABS: 875.0000 mg | ORAL_TABLET | Freq: Two times a day (BID) | ORAL | Status: AC

## 2010-12-28 MED ORDER — FLUCONAZOLE 150 MG PO TABS: 150.0000 mg | ORAL_TABLET | Freq: Once | ORAL | Status: AC

## 2010-12-28 NOTE — Op Note (Signed)
NAME:  [name], [name]               ACCOUNT NO.:  [id number]   MEDICAL RECORD NO.:  [id number]          PATIENT TYPE:  AMB   LOCATION:  SDC                           FACILITY:  [facility]   PHYSICIAN:  Zenaida Niece, M.D.DATE OF BIRTH:  03-Sep-1970   DATE OF PROCEDURE:  10/26/2007  DATE OF DISCHARGE:                               OPERATIVE REPORT   PREOPERATIVE DIAGNOSIS:  Pelvic pain.   POSTOPERATIVE DIAGNOSIS:  Pelvic pain with pelvic adhesions and  endometriosis.   PROCEDURE:  Open laparoscopy with adhesiolysis and fulguration of  endometriosis.   SURGEON:  Zenaida Niece, M.D.   ANESTHESIA:  General endotracheal tube.   FINDINGS:  She had normal tubes and ovaries and a normal appendix and  upper abdomen.  She had adhesions of the bowel to the vaginal cuff, and  a few spots of endometriosis in the right pelvis.   SPECIMENS:  None.   ESTIMATED BLOOD LOSS:  Minimal.   COMPLICATIONS:  None.   PROCEDURE IN DETAIL:  The patient was taken to the operating room and  placed in the dorsal supine position.  General anesthesia was induced,  and she was placed in mobile stirrups.  Abdomen and perineum were then  prepped and draped in the usual sterile fashion, bladder drained with a  latex-free catheter and a sponge stick placed into the vagina at the  vaginal cuff.  Infraumbilical skin was then infiltrated with 1/4%  Marcaine and a 3 cm horizontal incision was made at the level of her  previous scar.   The fascia was identified and elevated with Kocher clamps and entered  sharply.  The peritoneal cavity was then entered bluntly.  A pursestring  suture of #0 Vicryl was placed around the fascia, and the Hassan cannula  was inserted.  CO2 gas was insufflated and a laparoscope was inserted  with good visualization.  A 5 mm port was then placed on the left side  under direct visualization.  Inspection revealed the above-mentioned  findings.  Some of the adhesions which mostly were  fairly filmy were  taken down bluntly.  Others were taken down sharply with scissors with  adequate freeing of all adhesions and adequate hemostasis.  No further  adhesions were noted.   Both tubes and ovaries were freely mobile.  There were a couple of dark  spots on the right pelvic peritoneum consistent with endometriosis.  These were all fulgurated with bipolar cautery.  These lesions were well  away from the major vessels or the ureter.  No further source for her  pain was identified.   The 5 mm port was then removed under direct visualization.  All gas was  allowed to deflate from the abdomen and the umbilical trocar was  removed.  The pursestring suture of #0 Vicryl was tied, and the fascia  was found to be well approximated.  Skin incisions were then closed with  interrupted subcuticular sutures of 4-0 Vicryl followed by  Dermabond.  The sponge stick was then removed from the vagina.  The  patient tolerated the procedure well.  She was extubated  in the  operating room, and taken to the recovery room in stable condition.  Counts were correct and she had PAS hose on throughout the procedure.      Zenaida Niece, M.D.  Electronically Signed     TDM/MEDQ  D:  10/26/2007  T:  10/27/2007  Job:  [id number]

## 2010-12-28 NOTE — Assessment & Plan Note (Signed)
RST positive, 3/4 centor criteria.  Treat as strep with amoxicillin. Diflucan provided in case gets yeast infection.  Advised to hold simvastatin x 2-3 days if takes diflucan.

## 2010-12-28 NOTE — Progress Notes (Signed)
  Subjective:    Patient ID: [name], female    DOB: [birth year], 40 y.o.   MRN: [id number]  HPI CC: ST  3d h/o ST.  Feeling fatigued, run down.  Had strep throat last summer as well.  BF's children with strep.  Subjective fever, chills.  Hasn't tried anything for ST so far.  No cough, abd pain, n/v, HA, rash.  No smokers at home.  No h/o asthma.  Review of Systems Per HPI    Objective:   Physical Exam  Vitals reviewed. Constitutional: She appears well-developed and well-nourished. No distress.  HENT:  Head: Normocephalic and atraumatic.  Right Ear: External ear normal.  Left Ear: External ear normal.  Nose: Nose normal.  Mouth/Throat: Oropharyngeal exudate (R tonsillar) present.  Eyes: Conjunctivae and EOM are normal. Pupils are equal, round, and reactive to light. No scleral icterus.  Neck: Normal range of motion. Neck supple. No thyromegaly present.  Cardiovascular: Normal rate, regular rhythm, normal heart sounds and intact distal pulses.   No murmur heard. Pulmonary/Chest: Effort normal and breath sounds normal. No respiratory distress. She has no wheezes. She has no rales.  Lymphadenopathy:    She has cervical adenopathy (R AC LAD).  Skin: Skin is warm and dry. No rash noted.  Psychiatric: She has a normal mood and affect.          Assessment & Plan:

## 2010-12-28 NOTE — Patient Instructions (Signed)
You have strep pharyngitis. Push fluids and plenty of rest. May use ibuprofen for throat inflammation. Salt water gargles. Suck on cold things like popsicles or warm things like herbal teas (whichever soothes the throat better). Amoxicillin twice daily for 10 days. Diflucan provided as well. Return if fever >101.5, worsening pain, or trouble opening/closing mouth, or hoarse voice. Good to see you today, call clinic with questions.

## 2010-12-31 NOTE — Op Note (Signed)
[name], [name]               ACCOUNT NO.:  [id number]   MEDICAL RECORD NO.:  [id number]          PATIENT TYPE:  OBV   LOCATION:  9399                          FACILITY:  [facility]   PHYSICIAN:  Zenaida Niece, M.D.DATE OF BIRTH:  21-Mar-1971   DATE OF PROCEDURE:  03/08/2005  DATE OF DISCHARGE:                                 OPERATIVE REPORT   PREOPERATIVE DIAGNOSIS:  Pelvic pain and dyspareunia.   POSTOPERATIVE DIAGNOSIS:  Pelvic pain and dyspareunia.   PROCEDURE:  Laparoscopic assisted vaginal hysterectomy and cystoscopy.   SURGEON:  Zenaida Niece, M.D.   ASSISTANT:  Malachi Pro. Ambrose Mantle, M.D.   ANESTHESIA:  General endotracheal tube.   SPECIMENS:  Uterus.   ESTIMATED BLOOD LOSS:  100 mL.   COMPLICATIONS:  None.   FINDINGS:  Slightly irregular uterus, but normal size. Normal tubes, ovaries  and appendix. By cystoscopy she had a normal bladder with patent ureters.   PROCEDURE IN DETAIL:  The patient was taken to the operating room and placed  in the dorsosupine position. General anesthesia was induced and she was  placed in mobile stirrups. Abdomen, perineum and vagina were then prepped  and draped in the usual sterile fashion. Bladder was drained with a red  rubber catheter, and a sponge stick was placed into the vagina that was  soaked in methylene blue for vaginal visualization through the laparoscope.  Her infraumbilical skin was then infiltrated with quarter percent Marcaine  and a 1 cm horizontal incision was made. The Veress needle was inserted into  the perineal cavity and placement confirmed by the water drop test and  opening pressure of 3 mmHg. CO2 gas was insufflated to a pressure of 12 and  the Veress needle was removed. The 5 mm Excel trocar was then introduced  with direct visualization. 5 mm Excel trocars were then placed on each side  also under direct visualization. The patient was placed in Trendelenburg and  all bowel placed in the upper abdomen.  Pelvis was inspected with the above-  mentioned findings. Using the harmonic scalpel Ace, both fallopian tubes,  utero-ovarian ligaments, broad ligaments were taken down. Uterine arteries  were skeletonized and also taken with the harmonic scalpel. The anterior  peritoneum was incised across the anterior portion of the uterus and the  bladder pushed inferior. There was some scarring here from her prior  cesarean section. Using the sponge stick, pushing into the posterior fornix,  the harmonic scalpel was used to make an incision into the posterior vaginal  fornix under direct visualization. This was done again with good  visualization and the blue dye on the sponge was seen. The uterus was then  pushed posterior and the sponge stick placed in the anterior fornix. Similar  technique was used to enter the anterior cul-de-sac under direct  visualization with the bladder well superior. Hemostasis was achieved with  harmonic scalpel also using bipolar cautery on the right infundibulopelvic  ligament.  The entire pelvis was irrigated and all pedicles were assured to  be hemostatic.   Attention was then turned vaginally. The legs were elevated  in stirrups. A  weighted speculum was inserted in the vagina and Deaver retractor used  anteriorly. The vaginal incisions made through laparoscope were lengthened  and a Deaver retractor placed anteriorly to retract the bladder and a Banano  speculum placed posterior. Uterosacral ligaments were clamped, transected  and ligated with #1 chromic. The remaining pedicles of cardinal ligament  were clamped, transected and ligated with #1 chromic and the uterus was  removed. Bleeding from the vaginal cuff at the uterosacral pedicles was  controlled with figure-of-eight sutures of #1 chromic. Uterosacral ligaments  were then plicated in the midline with 2-0 silk. The previously tagged  uterosacral pedicles were then tied in the midline. Vagina was closed in a   vertical fashion with running locking 2-0 Vicryl.  This achieved good  hemostasis and closure.   The patient then given indigo carmine IV. The bladder was drained and the 70  degree cystoscope was inserted. The bladder was instilled with sterile  fluid. The bladder was found to be intact without any unusual lesions and  both ureters were patent with blue dye.  Attention was turned abdominally  again. The legs were lowered in the stirrups and I changed gloves. The  laparoscopic trocars were removed and all incisions were closed with  subcuticular sutures of 4-0 Vicryl. The lateral trocar skin incisions were  closed with Dermabond. The umbilical incision required two interrupted  sutures of 4-0 Vicryl for hemostasis followed by a Band-Aid. The patient was  then taken down from stirrups. The Foley catheter had been replaced after  cystoscopy. She was awakened in the operating room, tolerated the procedure  well and was taken to recovery room in stable condition. Counts were correct  x2, she received Ancef 1 gram prior to the procedure, she had PAS hose on  throughout procedure.       TDM/MEDQ  D:  03/08/2005  T:  03/08/2005  Job:  [id number]

## 2010-12-31 NOTE — Op Note (Signed)
NAME:  [name], [name]                         ACCOUNT NO.:  [id number]   MEDICAL RECORD NO.:  [id number]                   PATIENT TYPE:  AMB   LOCATION:  SDC                                  FACILITY:  [facility]   PHYSICIAN:  Zenaida Niece, M.D.             DATE OF BIRTH:  10-14-1970   DATE OF PROCEDURE:  03/19/2004  DATE OF DISCHARGE:                                 OPERATIVE REPORT   PREOPERATIVE DIAGNOSIS:  Pelvic pain and dyspareunia.   POSTOPERATIVE DIAGNOSIS:  Pelvic pain, dyspareunia, pelvic abdominal  adhesions and possible endometriosis.   OPERATION PERFORMED:  Diagnostic laparoscopy with adhesiolysis and  fulguration of one spot of possible endometriosis.   SURGEON:  Zenaida Niece, M.D.   ANESTHESIA:  General endotracheal tube.   ESTIMATED BLOOD LOSS:  Less than 50 mL.   FINDINGS:  She had a normal appearing uterus, tubes and ovaries.  The  omentum was adherent to the anterior abdominal wall.  There were some  sigmoid colon adhesions to the left pelvic side wall.  There were some small  adhesions in the anterior cul-de-sac due to her prior surgery and one spot  of possible endometriosis in the right anterior cul-de-sac.   DESCRIPTION OF PROCEDURE:  The patient was taken to the operating room and  placed in dorsal supine position.  General anesthesia was induced and she  was placed in mobile stirrups.  The abdomen was then prepped and draped in  the usual sterile fashion, bladder drained with a rhegmatogenous retinal  detachment, Hulka tenaculum applied to the cervix for uterine manipulation.  Infraumbilical skin was then infiltrated with 0.25% Marcaine and a 1.5 cm  horizontal incision was made.  The Veress needle was inserted into the  peritoneum and placement confirmed by the water drop test and an opening  pressure of 5 mmHg.  CO2 gas was insufflated to a pressure of 15 mmHg and  the Veress needle was removed.  The 10-11 disposable trocar was then  introduced  and placement confirmed by the laparoscope.  A 5 mm port was  placed low in the midline at the site of her previous Pfannenstiel incision  under direct visualization.  The first thing to be noted was omental  adhesions to the anterior abdominal wall.  These were taken down with  bipolar cautery and sharp dissection.  The uterus, tubes and ovaries looked  very normal.  The ovarian fossa and uterosacral ligaments and posterior cul-  de-sac were very normal.  The liver, gallbladder and upper abdomen were also  normal.  The appendix was not visualized.  The sigmoid was adherent to the  left pelvic sidewall and these adhesions were taken down sharply and small  bleeding controlled with bipolar cautery.  Small adhesions in the anterior  cul-de-sac were cut with sharp scissors.  Once one of these adhesions was  cut on the right side, there was noted to be  a dark spot which may be  consistent with endometriosis.  This was fulgurated with bipolar cautery.  No other source for pain or dyspareunia was identified.  All sites were  adequately hemostatic.  The 5 mm port was then removed under direct  visualization.  The laparoscope was removed and all gas allowed to deflate  from the abdomen.  The umbilical trocar was removed.  All skin incisions  were closed with interrupted subcuticular sutures  of 4-0 Vicryl followed by Steri-Strips and band-aids.  The Hulka tenaculum  was then removed.  The patient was then transferred to the recovery room in  stable condition.  All counts were correct, she received no antibiotics, she  had PAS hose on throughout the procedure.                                               Zenaida Niece, M.D.    TDM/MEDQ  D:  03/19/2004  T:  03/20/2004  Job:  [id number]

## 2010-12-31 NOTE — H&P (Signed)
[name], [name]               ACCOUNT NO.:  [id number]   MEDICAL RECORD NO.:  [id number]          PATIENT TYPE:  AMB   LOCATION:  SDC                           [facility]ACILITY:  [facility]   PHYSICIAN:  Zenaida Niece, M.D.DATE OF BIRTH:  Oct 23, 1970   DATE OF ADMISSION:  03/08/2005  DATE OF DISCHARGE:                                HISTORY & PHYSICAL   CHIEF COMPLAINT:  Persistent pelvic pain and dyspareunia.   HISTORY OF PRESENT ILLNESS:  This is a 40 year old white female, gravida 3,  para 1-0-2-1, whom I first saw in July of 2005.  At that time, she was  having pelvic pain and dyspareunia.  In August of 2005, she underwent  diagnostic laparoscopy with adhesiolysis and fulguration of one spot of  possible endometriosis.  The adhesions were of the omentum to the anterior  abdominal wall and some small adhesions in the anterior cul-de-sac due to  prior surgery and one spot of possible endometriosis in the right anterior  cul-de-sac.  She initially did well from this surgery. I saw her in January  of 2006 for her annual examination. At that time her pelvic pain was still  present, but was better except for the dyspareunia.  She was tender in the  right uterosacral area.  She was then treated with Depo-Lupron.  The  background pain slightly improved, but she continued to have some midline  pain and dyspareunia as well as occasional left pelvic pain and dyspareunia  on the right.  She at this point did not want to take anymore Lupron.  She  has had a previous urology workup with cystoscopy with normal findings.  All  options were discussed with the patient and she wishes to proceed with  definitive surgical therapy with hysterectomy at this time. She has been put  on Yaz birth control pills to try and control symptoms prior to surgery.   PAST OBSTETRICAL HISTORY:  One elective termination, one spontaneous  abortion, and one cesarean section at 32 weeks for preterm labor and arrest  of  dilation/descent.   PAST MEDICAL HISTORY:  Gastroesophageal reflux disease and anxiety.   PAST SURGICAL HISTORY:  Significant for the cesarean section and the above  mentioned laparoscopy as well as knee and shoulder surgery.   ALLERGIES:  CODEINE which causes GI upset.   CURRENT MEDICATIONS:  1.  Prevacid.  2.  YAZ birth control pills.  3.  Darvocet p.r.n.   SOCIAL HISTORY:  The patient is married and denies significant alcohol,  tobacco, or drug use.   FAMILY HISTORY:  Maternal aunt with breast cancer and two paternal uncles  with colon cancer.   REVIEW OF SYSTEMS:  Normal bowel and bladder function.   PHYSICAL EXAMINATION:  VITAL SIGNS:  Weight is approximately 180 pounds.  GENERAL:  This is a well-developed, well-nourished, white female in no acute  distress.  NECK:  Supple without lymphadenopathy or thyromegaly.  LUNGS:  Clear to auscultation.  HEART:  Regular rate and rhythm without murmur.  ABDOMEN:  Soft, nontender, and nondistended without palpable masses.  She  does have a  well-healed transverse scar.  EXTREMITIES:  No edema and are nontender.  PELVIC:  External genitalia is within normal limits.  Speculum examination  reveals a normal cervix.  On bimanual examination, she has a small  anteverted, nontender uterus and some tenderness in the right posterior  fornix.  She has no masses.   ASSESSMENT:  Persistent pelvic pain and dyspareunia with a history of  possible endometriosis.  The patient has tried several options of medical  therapy including Lupron and oral contraceptives.  These are not achieving  adequate results and she wishes to proceed to a definitive surgical therapy.  Risks of surgery including bleeding, infection, and damage to the  surrounding organs including the bowel, bladder, and ureters have been  discussed.   PLAN:  Admit the patient on the day of surgery for a laparoscopically-  assisted vaginal hysterectomy with possible bilateral  salpingo-oophorectomy  and cystoscopy.  We will leave her ovaries unless they look abnormal.       TDM/MEDQ  D:  03/07/2005  T:  03/07/2005  Job:  [id number]

## 2010-12-31 NOTE — H&P (Signed)
NAME:  [name], [name]                         ACCOUNT NO.:  [id number]   MEDICAL RECORD NO.:  [id number]                   PATIENT TYPE:  AMB   LOCATION:  [facility]                                  FACILITY:  [facility]   PHYSICIAN:  Zenaida Niece, M.D.             DATE OF BIRTH:  27-Apr-1971   DATE OF ADMISSION:  03/19/2004  DATE OF DISCHARGE:                                HISTORY & PHYSICAL   CHIEF COMPLAINT:  Pelvic pain and dyspareunia.   HISTORY OF PRESENT ILLNESS:  This is a 40 year old white female, gravida 3,  para 1-0-2-1 whom I first saw on February 13, 2004.  She reports regular menses  while on Loestrin 1/20 but complains of mood swings which are worse the week  before and the week of her menses. She had previously been on Yasmin with  breakthrough bleeding and has had increase in mood swings on Ortho Tri-  Cyclen. She has pelvic pain with cough, laughing and stretching. She has  deep dyspareunia approximately 20-30% of the time which is positional and  worse around her menses.  This is associated with decreased sex drive but  her ability to have an orgasm is spared. She does have some urinary  frequency, urgency and nocturia.  She had a pelvic ultrasound performed at  [facility] on June 20.  This revealed resolution of a prior  diagnosed left ovarian cyst and at that time she had two simple right  ovarian cysts.  On exam in the office on July 1, she had a soft nondistended  abdomen with a well healed transverse scar and a slightly tender right lower  quadrant.  On bimanual exam, she had a slightly tender posterior sac with +/-  cervical motion tenderness.  She had a small anteverted nontender uterus and  no palpable masses.  Possibilities of endometriosis or adhesions were  discussed with the patient and all medical and surgical options were  discussed.  She has elected to proceed with laparoscopy with possible  fulguration and adhesiolysis.   PAST OB HISTORY:  One elected  termination, one spontaneous abortion and one  cesarean section at 32 weeks.  Baby weighed 6 pounds 3 ounces and the  cesarean section was done for arrest of dilation/descent.   PAST MEDICAL HISTORY:  Gastroesophageal reflux disease and anxiety.   PAST SURGICAL HISTORY:  Knee surgery, shoulder surgery and the cesarean  section.   ALLERGIES:  CODEINE which causes GI upset.   CURRENT MEDICATIONS:  Loestrin 1/20, Prevacid and Xanax p.r.n.  Detrol LA 4  mg daily.  Zoloft p.r.n.   GYN HISTORY:  History of CIN 1 on Pap smear with normal followups and a  normal Pap smear in November of 2004. She has a history of rare HSV.   REVIEW OF SYMPTOMS:  The above mentioned urinary frequency, urgency and  nocturia which are being treated with Detrol. She also has symptoms  consistent with PMS which is being treated with Zoloft p.r.n.   SOCIAL HISTORY:  She is married and denies alcohol, tobacco or drug use.   FAMILY HISTORY:  Maternal aunt with breast cancer and a paternal uncle with  colon cancer.   PHYSICAL EXAMINATION:  VITAL SIGNS:  Weight is 171 pounds, pulse is 90,  blood pressure 118/80.  GENERAL:  This is a well-developed, well-nourished, white female in no acute  distress.  NECK:  Supple without lymphadenopathy or thyromegaly.  LUNGS:  Clear to auscultation.  HEART:  Regular rate and rhythm without murmur.  ABDOMEN:  Soft, nondistended, no palpable masses and she does have a well  healed transverse scar. She is slightly tender in the right lower quadrant.  EXTREMITIES:  No edema.  PELVIC:  External genitalia reveals no lesions.  On bimanual exam, she has a  small anteverted nontender uterus and no adnexal masses but she is slightly  tender posteriorly.   ASSESSMENT:  Pelvic pain and dyspareunia.  Possibilities include  endometriosis or adhesions.  Again all surgical and nonsurgical options have  been discussed with the patient. She wishes to proceed with surgical  evaluation.  The  risks of surgery including bleeding, infection and damage  to surrounding organs have been discussed and she understands.  Preoperative  urinalysis and urine culture done in the office are negative.   PLAN:  Admit the patient on the day of surgery for a diagnostic laparoscopy  with possible fulguration of endometriosis and possible adhesiolysis.                                               Zenaida Niece, M.D.    TDM/MEDQ  D:  03/17/2004  T:  03/17/2004  Job:  [id number]

## 2011-01-04 ENCOUNTER — Other Ambulatory Visit: Payer: Self-pay | Admitting: Family Medicine

## 2011-01-24 ENCOUNTER — Other Ambulatory Visit: Payer: Self-pay | Admitting: *Deleted

## 2011-01-24 MED ORDER — CLONAZEPAM 0.5 MG PO TABS: ORAL_TABLET | ORAL | Status: DC

## 2011-01-24 MED ORDER — ZOLPIDEM TARTRATE ER 12.5 MG PO TBCR: 12.5000 mg | EXTENDED_RELEASE_TABLET | Freq: Every evening | ORAL | Status: DC | PRN

## 2011-01-24 NOTE — Telephone Encounter (Signed)
Rx's called to pharmacy

## 2011-02-02 ENCOUNTER — Other Ambulatory Visit: Payer: Self-pay | Admitting: *Deleted

## 2011-02-02 MED ORDER — LANSOPRAZOLE 30 MG PO CPDR: 30.0000 mg | DELAYED_RELEASE_CAPSULE | Freq: Every day | ORAL | Status: DC

## 2011-02-22 ENCOUNTER — Other Ambulatory Visit: Payer: Self-pay | Admitting: *Deleted

## 2011-02-22 MED ORDER — CLONAZEPAM 0.5 MG PO TABS: ORAL_TABLET | ORAL | Status: DC

## 2011-02-22 MED ORDER — ZOLPIDEM TARTRATE ER 12.5 MG PO TBCR: 12.5000 mg | EXTENDED_RELEASE_TABLET | Freq: Every evening | ORAL | Status: DC | PRN

## 2011-02-22 NOTE — Telephone Encounter (Signed)
Received faxed refill request from pharmacy.

## 2011-02-22 NOTE — Telephone Encounter (Signed)
[facility]

## 2011-03-28 ENCOUNTER — Other Ambulatory Visit: Payer: Self-pay | Admitting: *Deleted

## 2011-03-28 MED ORDER — CLONAZEPAM 0.5 MG PO TABS: ORAL_TABLET | ORAL | Status: DC

## 2011-03-28 NOTE — Telephone Encounter (Signed)
Rx called to pharmacy

## 2011-03-29 ENCOUNTER — Other Ambulatory Visit: Payer: Self-pay | Admitting: *Deleted

## 2011-03-29 MED ORDER — CLONAZEPAM 0.5 MG PO TABS: ORAL_TABLET | ORAL | Status: DC

## 2011-03-29 MED ORDER — ZOLPIDEM TARTRATE ER 12.5 MG PO TBCR: 12.5000 mg | EXTENDED_RELEASE_TABLET | Freq: Every evening | ORAL | Status: DC | PRN

## 2011-03-29 NOTE — Telephone Encounter (Signed)
Last filled 02/22/11.

## 2011-03-31 NOTE — Telephone Encounter (Signed)
Meds called [facility].

## 2011-04-07 ENCOUNTER — Ambulatory Visit (INDEPENDENT_AMBULATORY_CARE_PROVIDER_SITE_OTHER): Payer: BC Managed Care – PPO | Admitting: Family Medicine

## 2011-04-07 ENCOUNTER — Encounter: Payer: Self-pay | Admitting: Family Medicine

## 2011-04-07 VITALS — BP 110/70 | HR 72 | Temp 98.1°F | Wt 164.8 lb

## 2011-04-07 DIAGNOSIS — R5383 Other fatigue: Secondary | ICD-10-CM | POA: Insufficient documentation

## 2011-04-07 DIAGNOSIS — R5381 Other malaise: Secondary | ICD-10-CM

## 2011-04-07 DIAGNOSIS — M797 Fibromyalgia: Secondary | ICD-10-CM | POA: Insufficient documentation

## 2011-04-07 LAB — VITAMIN B12: Vitamin B-12: 570 pg/mL (ref 211–911)

## 2011-04-07 LAB — CBC WITH DIFFERENTIAL/PLATELET
Basophils Relative: 0.2 % (ref 0.0–3.0)
Eosinophils Relative: 1.2 % (ref 0.0–5.0)
Lymphocytes Relative: 31.5 % (ref 12.0–46.0)
Neutrophils Relative %: 61 % (ref 43.0–77.0)
RBC: 4.4 Mil/uL (ref 3.87–5.11)
WBC: 9.4 10*3/uL (ref 4.5–10.5)

## 2011-04-07 LAB — FOLLICLE STIMULATING HORMONE: FSH: 2.4 m[IU]/mL

## 2011-04-07 NOTE — Progress Notes (Signed)
Subjective:    Patient ID: [name], female    DOB: 07/09/1971, 40 y.o.   MRN: [id number]  HPI  40 yo here to discuss fatigue and cholesterol.  Cholesterol- has been going to bariatric clinic. Remains on Simvastatin 10 mg daily and cholesterol has improved. Brings in lab work today. TG 181, HDL 43, LDL 141 (from 08/2010). Wt Readings from Last 3 Encounters:  04/07/11 164 lb 12 oz (74.73 kg)  12/28/10 168 lb (76.204 kg)  05/10/10 179 lb (81.194 kg)   Not taking any weight loss supplements.  Was getting hcg from bariatric clinic but no longer taking it. Feels good about weight loss but feels more tired lately.  Does have h/o Fibromyalgia but she is not sure that is what the fatigue is from. Not in pain.  Sleeping ok with Ambien and benadryl as needed but never feels rested. Is having some hot flashes at night. No CP, SOB, palpitations.  Patient Active Problem List  Diagnoses  . STREP THROAT  . GENITAL HERPES  . Other and unspecified hyperlipidemia  . INSOMNIA, CHRONIC  . GERD  . HERNIATED LUMBOSACRAL DISC  . SYNCOPE  . FATIGUE  . BULIMIA, HX OF  . Fatigue  . Fibromyalgia   Past Medical History  Diagnosis Date  . Fibromyalgia     Dr. Corliss Skains  . Genital herpes   . Bulimia nervosa     history  . Bulging disc     T7/8-receiving ESI's (Dr. Charlett Blake)   Past Surgical History  Procedure Date  . Cesarean section   . Abdominal hysterectomy     endometriosis  . Rotator cuff repair   . Parotidectomy 2009    benign tumor   History  Substance Use Topics  . Smoking status: Never Smoker   . Smokeless tobacco: Not on file  . Alcohol Use: Yes   Family History  Problem Relation Age of Onset  . Lung cancer Mother   . Stroke Father   . Breast cancer Maternal Aunt 54   Allergies  Allergen Reactions  . Codeine     REACTION: GI upset   Current Outpatient Prescriptions on File Prior to Visit  Medication Sig Dispense Refill  . clonazePAM (KLONOPIN) 0.5 MG tablet  Take one tablet by mouth at bedtime  30 tablet  0  . cyclobenzaprine (FLEXERIL) 10 MG tablet TAKE 1 TABLET BY MOUTH AT BEDTIME  30 tablet  6  . lansoprazole (PREVACID) 30 MG capsule Take 1 capsule (30 mg total) by mouth daily.  30 capsule  5  . NON FORMULARY daily. Corvalen powder-mix with liquid as directed       . simvastatin (ZOCOR) 10 MG tablet Take 10 mg by mouth at bedtime.        . traMADol (ULTRAM) 50 MG tablet Take 50 mg by mouth every 6 (six) hours as needed.        . valACYclovir (VALTREX) 500 MG tablet Take 500 mg by mouth 2 (two) times daily as needed.        . vitamin B-12 (CYANOCOBALAMIN) 500 MCG tablet Take 500-1,500 mcg by mouth daily as needed.        . zolpidem (AMBIEN CR) 12.5 MG CR tablet Take 1 tablet (12.5 mg total) by mouth at bedtime as needed for sleep.  30 tablet  0   The PMH, PSH, Social History, Family History, Medications, and allergies have been reviewed in [facility], and have been updated if relevant.   Review of Systems  See HPI Patient reports no  vision/ hearing changes,anorexia,  fever ,adenopathy, persistant / recurrent hoarseness, swallowing issues, chest pain, edema,persistant / recurrent cough, hemoptysis, dyspnea(rest, exertional, paroxysmal nocturnal), gastrointestinal  bleeding (melena, rectal bleeding), abdominal pain, excessive heart burn, GU symptoms(dysuria, hematuria, pyuria, voiding/incontinence  Issues) syncope, focal weakness, severe memory loss, concerning skin lesions, depression, anxiety, abnormal bruising/bleeding, major joint swelling, breast masses or abnormal vaginal bleeding.       Objective:   Physical Exam BP 110/70  Pulse 72  Temp(Src) 98.1 F (36.7 C) (Oral)  Wt 164 lb 12 oz (74.73 kg)  General:  Well-developed,well-nourished,in no acute distress; alert,appropriate and cooperative throughout examination Head:  normocephalic and atraumatic.   Eyes:  vision grossly intact, pupils equal, pupils round, and pupils reactive to light.     Ears:  R ear normal and L ear normal.   Nose:  no external deformity.   Mouth:  good dentition.   Neck:  No deformities, masses, or tenderness noted. Breasts:  No mass, nodules, thickening, tenderness, bulging, retraction, inflamation, nipple discharge or skin changes noted.   Lungs:  Normal respiratory effort, chest expands symmetrically. Lungs are clear to auscultation, no crackles or wheezes. Heart:  Normal rate and regular rhythm. S1 and S2 normal without gallop, murmur, click, rub or other extra sounds. Abdomen:  Bowel sounds positive,abdomen soft and non-tender without masses, organomegaly or hernias noted. Msk:  No deformity or scoliosis noted of thoracic or lumbar spine.   Extremities:  No clubbing, cyanosis, edema, or deformity noted with normal full range of motion of all joints.   Neurologic:  alert & oriented X3 and gait normal.   Skin:  Intact without suspicious lesions or rashes Cervical Nodes:  No lymphadenopathy noted Axillary Nodes:  No palpable lymphadenopathy Psych:  Cognition and judgment appear intact. Alert and cooperative with normal attention span and concentration. No apparent delusions, illusions, hallucinations      Assessment & Plan:   1. Fatigue   Deteriorated. Likely multifactorial with fibromyalgia playing a role.. I would like to check some other contributing factors. The patient indicates understanding of these issues and agrees with the plan.   CBC w/Diff, FSH, LH, B12, Vitamin D (25 hydroxy), TSH, T4, free

## 2011-04-08 LAB — VITAMIN D 25 HYDROXY (VIT D DEFICIENCY, FRACTURES): Vit D, 25-Hydroxy: 42 ng/mL (ref 30–89)

## 2011-04-24 ENCOUNTER — Other Ambulatory Visit: Payer: Self-pay | Admitting: Family Medicine

## 2011-04-25 ENCOUNTER — Other Ambulatory Visit: Payer: Self-pay | Admitting: *Deleted

## 2011-04-25 MED ORDER — CLONAZEPAM 0.5 MG PO TABS: ORAL_TABLET | ORAL | Status: DC

## 2011-04-25 NOTE — Telephone Encounter (Signed)
Last refill 03/28/2011

## 2011-04-26 NOTE — Telephone Encounter (Signed)
[facility] 

## 2011-04-28 ENCOUNTER — Other Ambulatory Visit: Payer: Self-pay | Admitting: *Deleted

## 2011-04-28 MED ORDER — ZOLPIDEM TARTRATE ER 12.5 MG PO TBCR: 12.5000 mg | EXTENDED_RELEASE_TABLET | Freq: Every evening | ORAL | Status: DC | PRN

## 2011-04-28 NOTE — Telephone Encounter (Signed)
Medication phoned to pharmacy.  

## 2011-05-05 ENCOUNTER — Telehealth: Payer: Self-pay | Admitting: *Deleted

## 2011-05-05 NOTE — Telephone Encounter (Signed)
Pt states she is having problems staying focused and concentrating.  She says this is due to her fibromyalgia, calls it fibro fog.  This has been going on since June.  She is asking if there is anything she can do, something she can take.  She states she under a lot of stress also, with full time job and school.  [facility].

## 2011-05-06 MED ORDER — BUPROPION HCL ER (SR) 150 MG PO TB12: 150.0000 mg | ORAL_TABLET | Freq: Two times a day (BID) | ORAL | Status: DC

## 2011-05-06 NOTE — Telephone Encounter (Signed)
Patient advised as instructed via telephone.  She would like to try Wellbutrin.

## 2011-05-06 NOTE — Telephone Encounter (Signed)
Unfortunately the only thing we could really add would be something like Wellbutrin if she is interested.

## 2011-05-06 NOTE — Telephone Encounter (Signed)
[facility] Please advise her to follow up in one month.

## 2011-05-06 NOTE — Telephone Encounter (Signed)
Patient advised via telephone, one month f/u appt scheduled for 06/06/2011 at 7:45.

## 2011-05-09 LAB — CBC
HCT: 37.8
Hemoglobin: 13.3
MCV: 90.2
RBC: 4.2
WBC: 8.6

## 2011-05-25 ENCOUNTER — Other Ambulatory Visit: Payer: Self-pay | Admitting: *Deleted

## 2011-05-25 MED ORDER — ZOLPIDEM TARTRATE ER 12.5 MG PO TBCR: 12.5000 mg | EXTENDED_RELEASE_TABLET | Freq: Every evening | ORAL | Status: DC | PRN

## 2011-05-25 MED ORDER — CLONAZEPAM 0.5 MG PO TABS: ORAL_TABLET | ORAL | Status: DC

## 2011-05-25 NOTE — Telephone Encounter (Signed)
Last refill 04/28/2011

## 2011-05-26 NOTE — Telephone Encounter (Signed)
[facility] 

## 2011-05-30 ENCOUNTER — Telehealth: Payer: Self-pay | Admitting: *Deleted

## 2011-05-30 NOTE — Telephone Encounter (Signed)
Pt states her employer is supplying her home office with an ergonomic work chair and they need a letter from you stating that pt needs this type of chair for her chronic fibromyalgia pain.  Please call her when the letter is ready.

## 2011-05-31 NOTE — Telephone Encounter (Signed)
In my box

## 2011-05-31 NOTE — Telephone Encounter (Signed)
Patient advised via telephone, letter ready for pick up will be left at front desk.

## 2011-06-06 ENCOUNTER — Ambulatory Visit: Payer: BC Managed Care – PPO | Admitting: Family Medicine

## 2011-06-07 ENCOUNTER — Ambulatory Visit (INDEPENDENT_AMBULATORY_CARE_PROVIDER_SITE_OTHER): Payer: BC Managed Care – PPO | Admitting: Family Medicine

## 2011-06-07 ENCOUNTER — Encounter: Payer: Self-pay | Admitting: Family Medicine

## 2011-06-07 VITALS — BP 110/78 | HR 79 | Temp 98.3°F | Ht 63.5 in | Wt 167.5 lb

## 2011-06-07 DIAGNOSIS — M797 Fibromyalgia: Secondary | ICD-10-CM

## 2011-06-07 DIAGNOSIS — R6889 Other general symptoms and signs: Secondary | ICD-10-CM

## 2011-06-07 DIAGNOSIS — IMO0001 Reserved for inherently not codable concepts without codable children: Secondary | ICD-10-CM

## 2011-06-07 DIAGNOSIS — R41840 Attention and concentration deficit: Secondary | ICD-10-CM | POA: Insufficient documentation

## 2011-06-07 MED ORDER — BUPROPION HCL ER (SR) 150 MG PO TB12: 150.0000 mg | ORAL_TABLET | Freq: Two times a day (BID) | ORAL | Status: DC

## 2011-06-07 NOTE — Progress Notes (Signed)
Subjective:    Patient ID: [name], female    DOB: [birth year], 40 y.o.   MRN: [id number]  HPI  40 yo here for follow up.  Called last month and asked for medication to be added to help with concentration at work. Fibromyalgia pain has been worse and she can often have difficulty concentrating when this occurs--she calls it "fibro fog." Also has been under increased stress at home and at work.  Started Wellbutrin 150 mg twice daily last month. Has noticed a great improvement since starting it. If she takes second dose too close to bed time, she does have a hard time falling asleep.  She has learned how to take it so it works for her. Concentrating better, has more energy.   Patient Active Problem List  Diagnoses  . STREP THROAT  . GENITAL HERPES  . Other and unspecified hyperlipidemia  . INSOMNIA, CHRONIC  . GERD  . HERNIATED LUMBOSACRAL DISC  . SYNCOPE  . FATIGUE  . BULIMIA, HX OF  . Fatigue  . Fibromyalgia  . Difficulty concentrating   Past Medical History  Diagnosis Date  . Fibromyalgia     Dr. Corliss Skains  . Genital herpes   . Bulimia nervosa     history  . Bulging disc     T7/8-receiving ESI's (Dr. Charlett Blake)   Past Surgical History  Procedure Date  . Cesarean section   . Abdominal hysterectomy     endometriosis  . Rotator cuff repair   . Parotidectomy 2009    benign tumor   History  Substance Use Topics  . Smoking status: Never Smoker   . Smokeless tobacco: Not on file  . Alcohol Use: Yes   Family History  Problem Relation Age of Onset  . Lung cancer Mother   . Stroke Father   . Breast cancer Maternal Aunt 54   Allergies  Allergen Reactions  . Codeine     REACTION: GI upset   Current Outpatient Prescriptions on File Prior to Visit  Medication Sig Dispense Refill  . buPROPion (WELLBUTRIN SR) 150 MG 12 hr tablet Take 1 tablet (150 mg total) by mouth 2 (two) times daily.  60 tablet  2  . clonazePAM (KLONOPIN) 0.5 MG tablet Take one tablet  by mouth at bedtime  30 tablet  0  . cyclobenzaprine (FLEXERIL) 10 MG tablet TAKE 1 TABLET BY MOUTH AT BEDTIME  30 tablet  6  . lansoprazole (PREVACID) 30 MG capsule Take 1 capsule (30 mg total) by mouth daily.  30 capsule  5  . NON FORMULARY daily. Corvalen powder-mix with liquid as directed       . simvastatin (ZOCOR) 10 MG tablet Take 10 mg by mouth at bedtime.        . traMADol (ULTRAM) 50 MG tablet TAKE 1 TABLET BY MOUTH EVERY 6 HOURS AS NEEDED FOR PAIN  120 tablet  0  . valACYclovir (VALTREX) 500 MG tablet Take 500 mg by mouth 2 (two) times daily as needed.        . vitamin B-12 (CYANOCOBALAMIN) 500 MCG tablet Take 500-1,500 mcg by mouth daily as needed.        . zolpidem (AMBIEN CR) 12.5 MG CR tablet Take 1 tablet (12.5 mg total) by mouth at bedtime as needed for sleep.  30 tablet  0   The PMH, PSH, Social History, Family History, Medications, and allergies have been reviewed in [facility], and have been updated if relevant.   Review of Systems See  HPI Patient reports no  vision/ hearing changes,anorexia,  fever ,adenopathy, persistant / recurrent hoarseness, swallowing issues, chest pain, edema,persistant / recurrent cough, hemoptysis, dyspnea(rest, exertional, paroxysmal nocturnal), gastrointestinal  bleeding (melena, rectal bleeding), abdominal pain, excessive heart burn, GU symptoms(dysuria, hematuria, pyuria, voiding/incontinence  Issues) syncope, focal weakness, severe memory loss, concerning skin lesions, depression, anxiety, abnormal bruising/bleeding, major joint swelling, breast masses or abnormal vaginal bleeding.       Objective:   Physical Exam BP 110/78  Pulse 79  Temp(Src) 98.3 F (36.8 C) (Oral)  Ht 5' 3.5" (1.613 m)  Wt 167 lb 8 oz (75.978 kg)  BMI 29.21 kg/m2  General:  Well-developed,well-nourished,in no acute distress; alert,appropriate and cooperative throughout examination Head:  normocephalic and atraumatic.   Eyes:  vision grossly intact, pupils equal, pupils  round, and pupils reactive to light.   Ears:  R ear normal and L ear normal.   Nose:  no external deformity.   Lungs:  Normal respiratory effort, chest expands symmetrically. Lungs are clear to auscultation, no crackles or wheezes. Heart:  Normal rate and regular rhythm. S1 and S2 normal without gallop, murmur, click, rub or other extra sounds. Abdomen:  Bowel sounds positive,abdomen soft and non-tender without masses, organomegaly or hernias noted. Msk:  No deformity or scoliosis noted of thoracic or lumbar spine.   Extremities:  No clubbing, cyanosis, edema, or deformity noted with normal full range of motion of all joints.   Neurologic:  alert & oriented X3 and gait normal.   Skin:  Intact without suspicious lesions or rashes Psych:  Cognition and judgment appear intact. Alert and cooperative with normal attention span and concentration. No apparent delusions, illusions, hallucinations      Assessment & Plan:   1. Difficulty concentrating  New but improving with Wellbutrin. Rx refilled.  2. Fibromyalgia  Deteriorated but she is working on techniques to help with her pain. Tends to get worse this time of year.

## 2011-06-07 NOTE — Patient Instructions (Signed)
Great to see you. Hang in there and keep me posted.

## 2011-06-08 ENCOUNTER — Other Ambulatory Visit: Payer: Self-pay | Admitting: Family Medicine

## 2011-06-13 ENCOUNTER — Other Ambulatory Visit: Payer: Self-pay | Admitting: Family Medicine

## 2011-06-13 DIAGNOSIS — Z1231 Encounter for screening mammogram for malignant neoplasm of breast: Secondary | ICD-10-CM

## 2011-06-20 ENCOUNTER — Other Ambulatory Visit: Payer: Self-pay | Admitting: *Deleted

## 2011-06-20 MED ORDER — CLONAZEPAM 0.5 MG PO TABS: ORAL_TABLET | ORAL | Status: DC

## 2011-06-20 NOTE — Telephone Encounter (Signed)
Last refill 05/26/2011.

## 2011-06-20 NOTE — Telephone Encounter (Signed)
[facility] 

## 2011-06-27 ENCOUNTER — Other Ambulatory Visit: Payer: Self-pay | Admitting: *Deleted

## 2011-06-27 MED ORDER — ZOLPIDEM TARTRATE ER 12.5 MG PO TBCR: 12.5000 mg | EXTENDED_RELEASE_TABLET | Freq: Every evening | ORAL | Status: DC | PRN

## 2011-06-27 NOTE — Telephone Encounter (Signed)
Received refill request from pharmacy for Zolpidem Tart ER 12.5 mg, take one tablet by mouth at bedtime as needed for sleep. Medication is not on med sheet. Is it okay to refill medication?

## 2011-06-27 NOTE — Telephone Encounter (Signed)
[facility] 

## 2011-06-27 NOTE — Telephone Encounter (Signed)
Yes ok to refill 

## 2011-07-04 ENCOUNTER — Ambulatory Visit (INDEPENDENT_AMBULATORY_CARE_PROVIDER_SITE_OTHER): Payer: BC Managed Care – PPO | Admitting: Psychiatry

## 2011-07-04 DIAGNOSIS — F4323 Adjustment disorder with mixed anxiety and depressed mood: Secondary | ICD-10-CM

## 2011-07-13 ENCOUNTER — Ambulatory Visit: Payer: BC Managed Care – PPO | Admitting: Psychiatry

## 2011-07-14 ENCOUNTER — Ambulatory Visit
Admission: RE | Admit: 2011-07-14 | Discharge: 2011-07-14 | Disposition: A | Discharge status: Home / Self Care | Payer: BC Managed Care – PPO | Source: Ambulatory Visit | Attending: Family Medicine | Admitting: Family Medicine

## 2011-07-14 ENCOUNTER — Encounter: Payer: Self-pay | Admitting: *Deleted

## 2011-07-14 DIAGNOSIS — Z1231 Encounter for screening mammogram for malignant neoplasm of breast: Secondary | ICD-10-CM

## 2011-07-18 ENCOUNTER — Ambulatory Visit: Payer: BC Managed Care – PPO | Admitting: Psychiatry

## 2011-07-22 ENCOUNTER — Ambulatory Visit (INDEPENDENT_AMBULATORY_CARE_PROVIDER_SITE_OTHER): Payer: BC Managed Care – PPO | Admitting: Family Medicine

## 2011-07-22 ENCOUNTER — Encounter: Payer: Self-pay | Admitting: Family Medicine

## 2011-07-22 DIAGNOSIS — J329 Chronic sinusitis, unspecified: Secondary | ICD-10-CM

## 2011-07-22 DIAGNOSIS — J069 Acute upper respiratory infection, unspecified: Secondary | ICD-10-CM

## 2011-07-22 MED ORDER — AMOXICILLIN-POT CLAVULANATE 875-125 MG PO TABS: 1.0000 | ORAL_TABLET | Freq: Two times a day (BID) | ORAL | Status: AC

## 2011-07-22 MED ORDER — FLUCONAZOLE 150 MG PO TABS: 150.0000 mg | ORAL_TABLET | Freq: Once | ORAL | Status: DC

## 2011-07-22 NOTE — Progress Notes (Signed)
SUBJECTIVE:  [name] is a 40 y.o. female who complains of coryza, congestion, sore throat, swollen glands and bilateral sinus pain for 14 days. She denies a history of anorexia and chest pain and denies a history of asthma. Patient denies smoke cigarettes.   Patient Active Problem List  Diagnoses  . STREP THROAT  . GENITAL HERPES  . Other and unspecified hyperlipidemia  . INSOMNIA, CHRONIC  . GERD  . HERNIATED LUMBOSACRAL DISC  . SYNCOPE  . FATIGUE  . BULIMIA, HX OF  . Fatigue  . Fibromyalgia  . Difficulty concentrating   Past Medical History  Diagnosis Date  . Fibromyalgia     Dr. Corliss Skains  . Genital herpes   . Bulimia nervosa     history  . Bulging disc     T7/8-receiving ESI's (Dr. Charlett Blake)   Past Surgical History  Procedure Date  . Cesarean section   . Abdominal hysterectomy     endometriosis  . Rotator cuff repair   . Parotidectomy 2009    benign tumor   History  Substance Use Topics  . Smoking status: Never Smoker   . Smokeless tobacco: Not on file  . Alcohol Use: Yes   Family History  Problem Relation Age of Onset  . Lung cancer Mother   . Stroke Father   . Breast cancer Maternal Aunt 54   Allergies  Allergen Reactions  . Codeine     REACTION: GI upset   Current Outpatient Prescriptions on File Prior to Visit  Medication Sig Dispense Refill  . buPROPion (WELLBUTRIN SR) 150 MG 12 hr tablet Take 1 tablet (150 mg total) by mouth 2 (two) times daily.  60 tablet  11  . clonazePAM (KLONOPIN) 0.5 MG tablet Take one tablet by mouth at bedtime  30 tablet  0  . cyclobenzaprine (FLEXERIL) 10 MG tablet TAKE 1 TABLET BY MOUTH AT BEDTIME  30 tablet  6  . lansoprazole (PREVACID) 30 MG capsule Take 1 capsule (30 mg total) by mouth daily.  30 capsule  5  . NON FORMULARY daily. Corvalen powder-mix with liquid as directed       . simvastatin (ZOCOR) 10 MG tablet TAKE 1 TABLET BY MOUTH AT BEDTIME  90 tablet  3  . traMADol (ULTRAM) 50 MG tablet TAKE 1 TABLET BY  MOUTH EVERY 6 HOURS AS NEEDED FOR PAIN  120 tablet  0  . valACYclovir (VALTREX) 500 MG tablet Take 500 mg by mouth 2 (two) times daily as needed.        . vitamin B-12 (CYANOCOBALAMIN) 500 MCG tablet Take 500-1,500 mcg by mouth daily as needed.        . zolpidem (AMBIEN CR) 12.5 MG CR tablet Take 1 tablet (12.5 mg total) by mouth at bedtime as needed.  30 tablet  0   The PMH, PSH, Social History, Family History, Medications, and allergies have been reviewed in [facility], and have been updated if relevant.  OBJECTIVE: She appears well, vital signs are as noted. Ears normal.  Throat and pharynx normal.  Neck supple. No adenopathy in the neck. Nose is congested. Sinuses non tender. The chest is clear, without wheezes or rales.  ASSESSMENT:  sinusitis  PLAN: Given duration and progression of symptoms, will treat for bacterial sinusitis with Augmentin. Symptomatic therapy suggested: push fluids, rest and return office visit prn if symptoms persist or worsen.  Call or return to clinic prn if these symptoms worsen or fail to improve as anticipated.

## 2011-07-22 NOTE — Patient Instructions (Signed)
Take antibiotic as directed.  Drink lots of fluids.  Treat sympotmatically with Mucinex, nasal saline irrigation, and Tylenol/Ibuprofen. Also try claritin D or zyrtec D over the counter- two times a day as needed ( have to sign for them at pharmacy). You can use warm compresses.  Cough suppressant at night. Call if not improving as expected in 5-7 days.    

## 2011-07-23 ENCOUNTER — Other Ambulatory Visit: Payer: Self-pay | Admitting: Family Medicine

## 2011-07-25 ENCOUNTER — Other Ambulatory Visit: Payer: Self-pay | Admitting: *Deleted

## 2011-07-25 MED ORDER — CLONAZEPAM 0.5 MG PO TABS: ORAL_TABLET | ORAL | Status: DC

## 2011-07-25 MED ORDER — ZOLPIDEM TARTRATE ER 12.5 MG PO TBCR: 12.5000 mg | EXTENDED_RELEASE_TABLET | Freq: Every evening | ORAL | Status: DC | PRN

## 2011-07-25 NOTE — Telephone Encounter (Signed)
Last refill 06/20/2011.

## 2011-07-26 NOTE — Telephone Encounter (Signed)
[facility] 

## 2011-08-18 ENCOUNTER — Other Ambulatory Visit: Payer: Self-pay | Admitting: Family Medicine

## 2011-08-19 ENCOUNTER — Other Ambulatory Visit: Payer: Self-pay | Admitting: *Deleted

## 2011-08-19 MED ORDER — CLONAZEPAM 0.5 MG PO TABS: ORAL_TABLET | ORAL | Status: DC

## 2011-08-19 NOTE — Telephone Encounter (Signed)
[facility] 

## 2011-08-22 ENCOUNTER — Ambulatory Visit (INDEPENDENT_AMBULATORY_CARE_PROVIDER_SITE_OTHER): Payer: BC Managed Care – PPO | Admitting: Psychiatry

## 2011-08-22 DIAGNOSIS — F4323 Adjustment disorder with mixed anxiety and depressed mood: Secondary | ICD-10-CM

## 2011-08-23 ENCOUNTER — Other Ambulatory Visit: Payer: Self-pay | Admitting: Family Medicine

## 2011-08-26 ENCOUNTER — Other Ambulatory Visit: Payer: Self-pay | Admitting: Internal Medicine

## 2011-08-26 MED ORDER — ZOLPIDEM TARTRATE ER 12.5 MG PO TBCR: 12.5000 mg | EXTENDED_RELEASE_TABLET | Freq: Every evening | ORAL | Status: DC | PRN

## 2011-08-26 NOTE — Telephone Encounter (Signed)
Pharmacy requesting refill.

## 2011-08-30 NOTE — Telephone Encounter (Signed)
[facility] 

## 2011-09-05 ENCOUNTER — Ambulatory Visit (INDEPENDENT_AMBULATORY_CARE_PROVIDER_SITE_OTHER): Payer: BC Managed Care – PPO | Admitting: Psychiatry

## 2011-09-05 DIAGNOSIS — F4322 Adjustment disorder with anxiety: Secondary | ICD-10-CM

## 2011-09-11 ENCOUNTER — Emergency Department: Payer: Self-pay | Admitting: Emergency Medicine

## 2011-09-11 LAB — COMPREHENSIVE METABOLIC PANEL
Albumin: 4.2 g/dL (ref 3.4–5.0)
Bilirubin,Total: 0.3 mg/dL (ref 0.2–1.0)
Chloride: 100 mmol/L (ref 98–107)
Co2: 28 mmol/L (ref 21–32)
Creatinine: 1.01 mg/dL (ref 0.60–1.30)
EGFR (African American): >60
EGFR (Non-African Amer.): >60
Potassium: 3.4 mmol/L — ABNORMAL LOW (ref 3.5–5.1)
SGOT(AST): 20 U/L (ref 15–37)
SGPT (ALT): 25 U/L
Sodium: 137 mmol/L (ref 136–145)

## 2011-09-11 LAB — CBC
MCH: 32 pg (ref 26.0–34.0)
MCHC: 34.4 g/dL (ref 32.0–36.0)
MCV: 93 fL (ref 80–100)
Platelet: 365 10*3/uL (ref 150–440)
RBC: 4.34 10*6/uL (ref 3.80–5.20)
RDW: 12 % (ref 11.5–14.5)
WBC: 15.6 10*3/uL — ABNORMAL HIGH (ref 3.6–11.0)

## 2011-09-11 LAB — URINALYSIS, COMPLETE
Nitrite: POSITIVE
Ph: 6 (ref 4.5–8.0)
Protein: 100
RBC,UR: 279 /HPF (ref 0–5)
Specific Gravity: 1.008 (ref 1.003–1.030)
Squamous Epithelial: 1
WBC UR: 114 /HPF (ref 0–5)

## 2011-09-19 ENCOUNTER — Other Ambulatory Visit: Payer: Self-pay | Admitting: *Deleted

## 2011-09-19 ENCOUNTER — Ambulatory Visit (INDEPENDENT_AMBULATORY_CARE_PROVIDER_SITE_OTHER): Payer: BC Managed Care – PPO | Admitting: Psychiatry

## 2011-09-19 DIAGNOSIS — F4322 Adjustment disorder with anxiety: Secondary | ICD-10-CM

## 2011-09-19 MED ORDER — CYCLOBENZAPRINE HCL 10 MG PO TABS: 10.0000 mg | ORAL_TABLET | Freq: Every evening | ORAL | Status: DC | PRN

## 2011-09-19 MED ORDER — CLONAZEPAM 0.5 MG PO TABS: ORAL_TABLET | ORAL | Status: DC

## 2011-09-19 NOTE — Telephone Encounter (Signed)
Rx called to pharmacy

## 2011-09-20 ENCOUNTER — Ambulatory Visit (INDEPENDENT_AMBULATORY_CARE_PROVIDER_SITE_OTHER): Payer: BC Managed Care – PPO | Admitting: Family Medicine

## 2011-09-20 ENCOUNTER — Encounter: Payer: Self-pay | Admitting: Family Medicine

## 2011-09-20 VITALS — BP 102/70 | HR 70 | Temp 97.7°F | Wt 169.2 lb

## 2011-09-20 DIAGNOSIS — R300 Dysuria: Secondary | ICD-10-CM

## 2011-09-20 DIAGNOSIS — N12 Tubulo-interstitial nephritis, not specified as acute or chronic: Secondary | ICD-10-CM | POA: Insufficient documentation

## 2011-09-20 LAB — POCT URINALYSIS DIPSTICK
Bilirubin, UA: NEGATIVE
Nitrite, UA: NEGATIVE
pH, UA: 5

## 2011-09-20 MED ORDER — BUPROPION HCL ER (XL) 300 MG PO TB24: 300.0000 mg | ORAL_TABLET | Freq: Every day | ORAL | Status: DC

## 2011-09-20 NOTE — Progress Notes (Signed)
Subjective:    Patient ID: [name], female    DOB: 15-Aug-1971, 41 y.o.   MRN: [id number]  HPI  41 yo here for ER follow up. Notes reviewed. Went to [facility] on 09/11/11 for dysuria, fever, right flank pain. Had right CVA tenderness on exam. WBC 15.6, UA positive for nitrite, 3+ LE, 3+ blood, 100 protein, mucous.  Given Cipro 500 mg twice daily x 7 days. Last dose was yesterday.  Feels much better. No dysuria.  UA pos for mod blood today  Patient Active Problem List  Diagnoses  . STREP THROAT  . GENITAL HERPES  . Other and unspecified hyperlipidemia  . INSOMNIA, CHRONIC  . GERD  . HERNIATED LUMBOSACRAL DISC  . SYNCOPE  . FATIGUE  . BULIMIA, HX OF  . Fatigue  . Fibromyalgia  . Difficulty concentrating   Past Medical History  Diagnosis Date  . Fibromyalgia     Dr. Corliss Skains  . Genital herpes   . Bulimia nervosa     history  . Bulging disc     T7/8-receiving ESI's (Dr. Charlett Blake)   Past Surgical History  Procedure Date  . Cesarean section   . Abdominal hysterectomy     endometriosis  . Rotator cuff repair   . Parotidectomy 2009    benign tumor   History  Substance Use Topics  . Smoking status: Never Smoker   . Smokeless tobacco: Not on file  . Alcohol Use: Yes   Family History  Problem Relation Age of Onset  . Lung cancer Mother   . Stroke Father   . Breast cancer Maternal Aunt 54   Allergies  Allergen Reactions  . Codeine     REACTION: GI upset   Current Outpatient Prescriptions on File Prior to Visit  Medication Sig Dispense Refill  . clonazePAM (KLONOPIN) 0.5 MG tablet Take one tablet by mouth at bedtime  30 tablet  0  . cyclobenzaprine (FLEXERIL) 10 MG tablet Take 1 tablet (10 mg total) by mouth at bedtime as needed for muscle spasms.  30 tablet  0  . lansoprazole (PREVACID) 30 MG capsule TAKE 1 CAPSULE BY MOUTH ONCE A DAY  30 capsule  12  . simvastatin (ZOCOR) 10 MG tablet TAKE 1 TABLET BY MOUTH AT BEDTIME  90 tablet  3  . traMADol (ULTRAM)  50 MG tablet TAKE 1 TABLET BY MOUTH EVERY 6 HOURS AS NEEDED FOR PAIN  120 tablet  0  . valACYclovir (VALTREX) 500 MG tablet Take 500 mg by mouth 2 (two) times daily as needed.        . vitamin B-12 (CYANOCOBALAMIN) 500 MCG tablet Take 500-1,500 mcg by mouth daily as needed.        . zolpidem (AMBIEN CR) 12.5 MG CR tablet Take 1 tablet (12.5 mg total) by mouth at bedtime as needed.  30 tablet  0   The PMH, PSH, Social History, Family History, Medications, and allergies have been reviewed in [facility], and have been updated if relevant.   Review of Systems See HPI No dysuria, no fevers, no nausea, no vomiting.    Objective:   Physical Exam BP 102/70  Pulse 70  Temp(Src) 97.7 F (36.5 C) (Oral)  Wt 169 lb 4 oz (76.771 kg)  General:  Well-developed,well-nourished,in no acute distress; alert,appropriate and cooperative throughout examination Head:  normocephalic and atraumatic.   Mouth:  good dentition.   Abdomen:  Bowel sounds positive,abdomen soft and non-tender without masses, organomegaly or hernias noted. No CVA tenderness Msk:  No deformity or scoliosis noted of thoracic or lumbar spine.   Extremities:  No clubbing, cyanosis, edema, or deformity noted with normal full range of motion of all joints.     Skin:  Intact without suspicious lesions or rashes Psych:  Cognition and judgment appear intact. Alert and cooperative with normal attention span and concentration. No apparent delusions, illusions, hallucinations      Assessment & Plan:   1. Pyelonephritis     New, resolving. Send urine for culture to make sure that bacteria has cleared. Will repeat in UA in several weeks for repeat UA for microhematuria.

## 2011-09-23 ENCOUNTER — Other Ambulatory Visit: Payer: Self-pay | Admitting: *Deleted

## 2011-09-23 MED ORDER — ZOLPIDEM TARTRATE ER 12.5 MG PO TBCR: 12.5000 mg | EXTENDED_RELEASE_TABLET | Freq: Every evening | ORAL | Status: DC | PRN

## 2011-09-23 NOTE — Telephone Encounter (Signed)
[facility] 

## 2011-10-03 ENCOUNTER — Ambulatory Visit: Payer: BC Managed Care – PPO | Admitting: Psychiatry

## 2011-10-07 ENCOUNTER — Telehealth: Payer: Self-pay

## 2011-10-07 NOTE — Telephone Encounter (Signed)
Patient called stating that the Tramadol you gave her is not helping the pain.  She is requesting Percocet 5-325 mg.  She was given that in the hospital for her kidneys and it helps with the pain and doesn't make her sleepy and she can function on it. Please call to discuss.  Her pharmacy is [facility]

## 2011-10-07 NOTE — Telephone Encounter (Signed)
i don't think i prescribed tramadol in past. Please notify PCP is out of town until Monday.  Won't be until then that she hears back from [place].

## 2011-10-09 NOTE — Telephone Encounter (Signed)
Ok to refill tramadol but if she is still needing narcotic for pain, needs to be seen.

## 2011-10-10 NOTE — Telephone Encounter (Signed)
How do you want this prescribed so it can be called in?

## 2011-10-10 NOTE — Telephone Encounter (Signed)
Rx called in as directed. Patient notified and advised she would need eval for any stronger med. She verbalized understanding.

## 2011-10-10 NOTE — Telephone Encounter (Signed)
Tramadol 50 mg three times daily prn pain. #90, no refills.

## 2011-10-20 ENCOUNTER — Other Ambulatory Visit: Payer: Self-pay | Admitting: Family Medicine

## 2011-10-21 MED ORDER — CLONAZEPAM 0.5 MG PO TABS: ORAL_TABLET | ORAL | Status: DC

## 2011-10-21 MED ORDER — ZOLPIDEM TARTRATE ER 12.5 MG PO TBCR: 12.5000 mg | EXTENDED_RELEASE_TABLET | Freq: Every evening | ORAL | Status: DC | PRN

## 2011-10-21 NOTE — Telephone Encounter (Signed)
Meds called to pharmacy 

## 2011-10-24 ENCOUNTER — Emergency Department: Payer: Self-pay | Admitting: Emergency Medicine

## 2011-10-24 LAB — CBC WITH DIFFERENTIAL/PLATELET
Basophil #: 0 10*3/uL (ref 0.0–0.1)
HGB: 12.7 g/dL (ref 12.0–16.0)
Lymphocyte %: 30.3 %
MCV: 92 fL (ref 80–100)
Monocyte %: 6.3 %
Platelet: 365 10*3/uL (ref 150–440)
RBC: 3.93 10*6/uL (ref 3.80–5.20)
RDW: 11.9 % (ref 11.5–14.5)

## 2011-10-24 LAB — COMPREHENSIVE METABOLIC PANEL
Anion Gap: 7 (ref 7–16)
Bilirubin,Total: 0.2 mg/dL (ref 0.2–1.0)
Calcium, Total: 8.4 mg/dL — ABNORMAL LOW (ref 8.5–10.1)
EGFR (African American): >60
EGFR (Non-African Amer.): >60
Glucose: 83 mg/dL (ref 65–99)
Osmolality: 283 (ref 275–301)
Potassium: 3.4 mmol/L — ABNORMAL LOW (ref 3.5–5.1)
SGOT(AST): 23 U/L (ref 15–37)
Total Protein: 7.2 g/dL (ref 6.4–8.2)

## 2011-10-28 ENCOUNTER — Telehealth: Payer: Self-pay | Admitting: Family Medicine

## 2011-10-28 NOTE — Telephone Encounter (Signed)
Triage Record Num: [id number] Operator: Chevis Pretty Patient Name: [name] Call Date & Time: 10/28/2011 5:13:57PM Patient Phone: [phone] PCP: Ruthe Mannan Patient Gender: Female PCP Fax : [phone] Patient DOB: [birth year] Practice Name: [facility] Reason for Call: Caller: Tesa/Patient; PCP: Ruthe Mannan Nestor Ramp); CB#: [phone]; ; ; Call regarding Flare Up of Arthritis in Neck; has been treated for this but it is now in flare. States she has neck stiffness and it is difficult to turn her head to drive. Per protocol, emergent symptoms denied; advised appt within 24 hours. Appt sched at [facility] office 10/29/11 1115. Protocol(s) Used: Neck Pain or Injury Recommended Outcome per Protocol: See Provider within 24 hours Reason for Outcome: Painful involuntary spasm of neck or jaw WITHOUT injury Care Advice: ~ Another adult should drive. ~ Call provider if symptoms worsen or new symptoms develop. Maintain good posture. Avoid putting pressure on a nerve by not carrying heavy objects such as computer case or backpack. Avoid overuse activities - alternate activities by switching sides and limit length of activity. Avoid lifting heavy objects. ~ ~ SYMPTOM / CONDITION MANAGEMENT ~ CAUTIONS ~ List, or take, all current prescription(s), nonprescription or alternative medication(s) to provider for evaluation. Analgesic/Antipyretic Advice - Acetaminophen: Consider acetaminophen as directed on label or by pharmacist/provider for pain or fever PRECAUTIONS: - Use if there is no history of liver disease, alcoholism, or intake of three or more alcohol drinks per day - Only if approved by provider during pregnancy or when breastfeeding - During pregnancy, acetaminophen should not be taken more than 3 consecutive days without telling provider - Do not exceed recommended dose or frequency ~ Apply local moist heat (such as a warm, wet wash cloth covered with plastic wrap) to the  area for 15-20 minutes every 2-3 hours while awake. ~ Analgesic/Antipyretic Advice - NSAIDs: Consider aspirin, ibuprofen, naproxen or ketoprofen for pain or fever as directed on label or by pharmacist/provider. PRECAUTIONS: - If over 21 years of age, should not take longer than 1 week without consulting provider. EXCEPTIONS: - Should not be used if taking blood thinners or have bleeding problems. - Do not use if have history of sensitivity/allergy to any of these medications; or history of cardiovascular, ulcer, kidney, liver disease or diabetes unless approved by provider. - Do not exceed recommended dose or frequency. ~ 03

## 2011-10-29 ENCOUNTER — Encounter: Payer: Self-pay | Admitting: Family Medicine

## 2011-10-29 ENCOUNTER — Ambulatory Visit (INDEPENDENT_AMBULATORY_CARE_PROVIDER_SITE_OTHER): Payer: BC Managed Care – PPO | Admitting: Family Medicine

## 2011-10-29 VITALS — BP 120/80 | HR 84 | Temp 98.2°F | Wt 169.0 lb

## 2011-10-29 DIAGNOSIS — M542 Cervicalgia: Secondary | ICD-10-CM

## 2011-10-29 MED ORDER — OXYCODONE-ACETAMINOPHEN 10-325 MG PO TABS: 1.0000 | ORAL_TABLET | Freq: Four times a day (QID) | ORAL | Status: AC | PRN

## 2011-10-29 MED ORDER — METHYLPREDNISOLONE ACETATE 80 MG/ML IJ SUSP
120.0000 mg | Freq: Once | INTRAMUSCULAR | Status: AC
  Administered 2011-10-29: 120 mg via INTRAMUSCULAR

## 2011-10-29 NOTE — Progress Notes (Signed)
Addended byRosalio Macadamia, Stephaniemarie Stoffel B on: 10/29/2011 01:01 PM   Modules accepted: Orders

## 2011-10-29 NOTE — Progress Notes (Signed)
  Subjective:    Patient ID: [name], female    DOB: 1971-07-26, 41 y.o.   MRN: [id number]  HPI Here for stiffness and pain in the neck. She sees Dr. Corliss Skains for fibromyalgia and Dr. Naaman Plummer for Physical Medicine care. She has has several steroid injections to the neck in the past few months with little improvement. Using heat and Flexeril.    Review of Systems  Constitutional: Negative.   HENT: Positive for neck pain and neck stiffness.        Objective:   Physical Exam  Constitutional: She appears well-developed and well-nourished.  Neck:       Reduced ROM of the neck with tenderness and spasm           Assessment & Plan:  Follow up with Dr. Alvester Morin next week

## 2011-11-08 ENCOUNTER — Other Ambulatory Visit: Payer: Self-pay | Admitting: Family Medicine

## 2011-11-08 NOTE — Telephone Encounter (Signed)
[facility] from [facility] [address], no last date filled given.

## 2011-11-08 NOTE — Telephone Encounter (Signed)
Please look at when these were last filled because I think we just refilled this.  I'm not sure how to find that. Thanks!

## 2011-11-09 NOTE — Telephone Encounter (Signed)
Meds last filled on 37 and 3/8.  Advised pharmacist this is too soon for refills.

## 2011-11-20 ENCOUNTER — Other Ambulatory Visit: Payer: Self-pay | Admitting: Family Medicine

## 2011-11-21 ENCOUNTER — Other Ambulatory Visit: Payer: Self-pay | Admitting: *Deleted

## 2011-11-21 MED ORDER — ZOLPIDEM TARTRATE ER 12.5 MG PO TBCR: 12.5000 mg | EXTENDED_RELEASE_TABLET | Freq: Every evening | ORAL | Status: DC | PRN

## 2011-11-21 MED ORDER — CLONAZEPAM 0.5 MG PO TABS: ORAL_TABLET | ORAL | Status: DC

## 2011-11-21 NOTE — Telephone Encounter (Signed)
meds called to pharmacy

## 2011-11-21 NOTE — Telephone Encounter (Signed)
Faxed requests from [facility] [address], no last filled dates given.

## 2011-11-24 ENCOUNTER — Ambulatory Visit (INDEPENDENT_AMBULATORY_CARE_PROVIDER_SITE_OTHER): Payer: BC Managed Care – PPO | Admitting: Family Medicine

## 2011-11-24 ENCOUNTER — Encounter: Payer: Self-pay | Admitting: Family Medicine

## 2011-11-24 VITALS — BP 108/74 | HR 80 | Temp 98.2°F | Wt 165.0 lb

## 2011-11-24 DIAGNOSIS — M5412 Radiculopathy, cervical region: Secondary | ICD-10-CM | POA: Insufficient documentation

## 2011-11-24 DIAGNOSIS — G8928 Other chronic postprocedural pain: Secondary | ICD-10-CM | POA: Insufficient documentation

## 2011-11-24 DIAGNOSIS — M542 Cervicalgia: Secondary | ICD-10-CM | POA: Insufficient documentation

## 2011-11-24 DIAGNOSIS — M797 Fibromyalgia: Secondary | ICD-10-CM

## 2011-11-24 DIAGNOSIS — R300 Dysuria: Secondary | ICD-10-CM | POA: Insufficient documentation

## 2011-11-24 DIAGNOSIS — IMO0001 Reserved for inherently not codable concepts without codable children: Secondary | ICD-10-CM

## 2011-11-24 LAB — POCT URINALYSIS DIPSTICK
Bilirubin, UA: NEGATIVE
Ketones, UA: NEGATIVE
Leukocytes, UA: NEGATIVE
pH, UA: 6

## 2011-11-24 MED ORDER — CIPROFLOXACIN HCL 500 MG PO TABS: 500.0000 mg | ORAL_TABLET | Freq: Two times a day (BID) | ORAL | Status: DC

## 2011-11-24 MED ORDER — MILNACIPRAN HCL 12.5 & 25 & 50 MG PO MISC: ORAL | Status: DC

## 2011-11-24 NOTE — Progress Notes (Signed)
Subjective:    Patient ID: [name], female    DOB: 1971-03-20, 41 y.o.   MRN: [id number]  HPI  41 yo with h/o pyelo in 09/2011, cervical disc disease and fibromyalgia here for:  1. ?UTI- past few days, some suprapubic discomfort.  No dysuria or increased urinary frequency but she did not have many symptoms originally when she had pyelo either. No fevers, chills, nausea or vomiting.  2.  Cervical disc disease with left sided radiculopathy- followed by Dr. Alvester Morin at [facility]. She has been receiving injections.  She would like a second opinion.  3.  Fibromyalgia- feels it is getting worse.  Has been on Lyrica in past with no improvement. Worse pain is in her hips and shoulders. Ready to try something else. Uses Tramadol and percocet infrequently for pain.     Patient Active Problem List  Diagnoses  . STREP THROAT  . GENITAL HERPES  . Other and unspecified hyperlipidemia  . INSOMNIA, CHRONIC  . GERD  . HERNIATED LUMBOSACRAL DISC  . SYNCOPE  . FATIGUE  . BULIMIA, HX OF  . Fatigue  . Fibromyalgia  . Difficulty concentrating  . Pyelonephritis  . Dysuria  . Neck pain   Past Medical History  Diagnosis Date  . Fibromyalgia     Dr. Corliss Skains  . Genital herpes   . Bulimia nervosa     history  . Bulging disc     T7/8-receiving ESI's (Dr. Charlett Blake)   Past Surgical History  Procedure Date  . Cesarean section   . Abdominal hysterectomy     endometriosis  . Rotator cuff repair   . Parotidectomy 2009    benign tumor   History  Substance Use Topics  . Smoking status: Never Smoker   . Smokeless tobacco: Not on file  . Alcohol Use: Yes   Family History  Problem Relation Age of Onset  . Lung cancer Mother   . Stroke Father   . Breast cancer Maternal Aunt 54   Allergies  Allergen Reactions  . Codeine     REACTION: GI upset   Current Outpatient Prescriptions on File Prior to Visit  Medication Sig Dispense Refill  . buPROPion (WELLBUTRIN XL) 300 MG 24 hr tablet  Take 1 tablet (300 mg total) by mouth daily.  30 tablet  6  . clonazePAM (KLONOPIN) 0.5 MG tablet Take one tablet by mouth at bedtime  30 tablet  0  . cyclobenzaprine (FLEXERIL) 10 MG tablet TAKE 1 TABLET BY MOUTH AT BEDTIME AS NEEDED FOR MUSCLE SPASMS.  30 tablet  0  . lansoprazole (PREVACID) 30 MG capsule TAKE 1 CAPSULE BY MOUTH ONCE A DAY  30 capsule  12  . simvastatin (ZOCOR) 10 MG tablet TAKE 1 TABLET BY MOUTH AT BEDTIME  90 tablet  3  . traMADol (ULTRAM) 50 MG tablet TAKE 1 TABLET BY MOUTH EVERY 6 HOURS AS NEEDED FOR PAIN  120 tablet  0  . valACYclovir (VALTREX) 500 MG tablet Take 500 mg by mouth 2 (two) times daily as needed.        . vitamin B-12 (CYANOCOBALAMIN) 500 MCG tablet Take 500-1,500 mcg by mouth daily as needed.        . zolpidem (AMBIEN CR) 12.5 MG CR tablet Take 1 tablet (12.5 mg total) by mouth at bedtime as needed for sleep.  30 tablet  0  . zolpidem (AMBIEN CR) 12.5 MG CR tablet Take 1 tablet (12.5 mg total) by mouth at bedtime as needed for sleep.  30 tablet  0  . zolpidem (AMBIEN CR) 12.5 MG CR tablet Take 1 tablet (12.5 mg total) by mouth at bedtime as needed for sleep.  30 tablet  0  . zolpidem (AMBIEN CR) 12.5 MG CR tablet Take 1 tablet (12.5 mg total) by mouth at bedtime as needed.  30 tablet  0   The PMH, PSH, Social History, Family History, Medications, and allergies have been reviewed in [facility], and have been updated if relevant.   Review of Systems See HPI No dysuria, no fevers, no nausea, no vomiting.    Objective:   Physical Exam BP 108/74  Pulse 80  Temp(Src) 98.2 F (36.8 C) (Oral)  Wt 165 lb (74.844 kg)  General:  Well-developed,well-nourished,in no acute distress; alert,appropriate and cooperative throughout examination Head:  normocephalic and atraumatic.   Mouth:  good dentition.   Abdomen:  Bowel sounds positive,abdomen soft and non-tender without masses, organomegaly or hernias noted. No CVA tenderness Msk:  No deformity or scoliosis noted of  thoracic or lumbar spine.   Extremities:  No clubbing, cyanosis, edema, or deformity noted with normal full range of motion of all joints.     Skin:  Intact without suspicious lesions or rashes Psych:  Cognition and judgment appear intact. Alert and cooperative with normal attention span and concentration. No apparent delusions, illusions, hallucinations      Assessment & Plan:   1. ?UTI UA pos for RBCs only. Will treat with cipro 500 mg twice daily and send urine for cx given her history. The patient indicates understanding of these issues and agrees with the plan.  POCT urinalysis dipstick, Urine culture  2. Cervical pain (neck)  Deteriorated. Referral placed for a second opinion. Ambulatory referral to Orthopedic Surgery  3. Fibromyalgia  Deteriorated.  Discussed tx options. Start savella titration pack. Discussed possible serotonin syndrome with tramadol- pt aware and uses tramadol very infrequently.

## 2011-11-24 NOTE — Patient Instructions (Signed)
Please stop by to see [name] on your way out to set up your second opion. Try the [facility]- give it time. We will call you with your urine culture.

## 2011-11-26 LAB — URINE CULTURE: Colony Count: >=100000

## 2011-12-09 ENCOUNTER — Other Ambulatory Visit: Payer: Self-pay | Admitting: Family Medicine

## 2011-12-20 ENCOUNTER — Other Ambulatory Visit: Payer: Self-pay | Admitting: *Deleted

## 2011-12-20 MED ORDER — ZOLPIDEM TARTRATE ER 12.5 MG PO TBCR: 12.5000 mg | EXTENDED_RELEASE_TABLET | Freq: Every evening | ORAL | Status: DC | PRN

## 2011-12-20 MED ORDER — CLONAZEPAM 0.5 MG PO TABS: ORAL_TABLET | ORAL | Status: DC

## 2011-12-20 NOTE — Telephone Encounter (Signed)
Medicines called to pharmacy. 

## 2011-12-20 NOTE — Telephone Encounter (Signed)
Received faxed refill request from pharmacy. Is it okay to refill medications? 

## 2011-12-23 ENCOUNTER — Other Ambulatory Visit: Payer: Self-pay | Admitting: Family Medicine

## 2011-12-23 MED ORDER — MILNACIPRAN HCL 50 MG PO TABS: 50.0000 mg | ORAL_TABLET | Freq: Two times a day (BID) | ORAL | Status: DC

## 2011-12-23 NOTE — Telephone Encounter (Signed)
Okay 50mg   #60 x 0 1 bid

## 2011-12-23 NOTE — Telephone Encounter (Signed)
[facility] request refill Savella titration pack.pt last seen 11/24/11. Since Dr Dayton Martes is not on computer this afternoon please advise.

## 2011-12-23 NOTE — Telephone Encounter (Signed)
rx sent to pharmacy by e-script  

## 2012-01-03 ENCOUNTER — Other Ambulatory Visit: Payer: Self-pay | Admitting: Family Medicine

## 2012-01-12 ENCOUNTER — Other Ambulatory Visit: Payer: Self-pay | Admitting: *Deleted

## 2012-01-12 NOTE — Telephone Encounter (Signed)
Advised pharmacy.

## 2012-01-12 NOTE — Telephone Encounter (Signed)
Faxed refill request from [facility].  According to chart last filled 30 on 12/20/11.

## 2012-01-20 ENCOUNTER — Other Ambulatory Visit: Payer: Self-pay

## 2012-01-20 NOTE — Telephone Encounter (Signed)
[facility] fax[facility] refill clonazepam.No last refill date given.Please advise.

## 2012-01-21 MED ORDER — CLONAZEPAM 0.5 MG PO TABS: ORAL_TABLET | ORAL | Status: DC

## 2012-01-22 ENCOUNTER — Other Ambulatory Visit: Payer: Self-pay | Admitting: Internal Medicine

## 2012-01-22 ENCOUNTER — Other Ambulatory Visit: Payer: Self-pay | Admitting: Family Medicine

## 2012-01-23 ENCOUNTER — Other Ambulatory Visit: Payer: Self-pay | Admitting: *Deleted

## 2012-01-23 MED ORDER — ZOLPIDEM TARTRATE ER 12.5 MG PO TBCR: 12.5000 mg | EXTENDED_RELEASE_TABLET | Freq: Every evening | ORAL | Status: DC | PRN

## 2012-01-23 NOTE — Telephone Encounter (Signed)
Faxed refill request from [facility].  No last filled date given.

## 2012-01-23 NOTE — Telephone Encounter (Signed)
Medicine called to pharmacy. 

## 2012-01-26 ENCOUNTER — Other Ambulatory Visit: Payer: Self-pay | Admitting: Family Medicine

## 2012-02-21 ENCOUNTER — Other Ambulatory Visit: Payer: Self-pay | Admitting: *Deleted

## 2012-02-21 ENCOUNTER — Other Ambulatory Visit: Payer: Self-pay | Admitting: Family Medicine

## 2012-02-21 MED ORDER — CLONAZEPAM 0.5 MG PO TABS: ORAL_TABLET | ORAL | Status: DC

## 2012-02-21 NOTE — Telephone Encounter (Signed)
Faxed refill request from [facility] [address]. 

## 2012-02-21 NOTE — Telephone Encounter (Signed)
[facility] 

## 2012-02-29 ENCOUNTER — Other Ambulatory Visit: Payer: Self-pay | Admitting: *Deleted

## 2012-02-29 MED ORDER — ZOLPIDEM TARTRATE ER 12.5 MG PO TBCR: 12.5000 mg | EXTENDED_RELEASE_TABLET | Freq: Every evening | ORAL | Status: DC | PRN

## 2012-02-29 NOTE — Telephone Encounter (Signed)
[facility] 

## 2012-02-29 NOTE — Telephone Encounter (Signed)
Faxed refill request from [facility]. 

## 2012-03-12 ENCOUNTER — Other Ambulatory Visit: Payer: Self-pay | Admitting: *Deleted

## 2012-03-12 MED ORDER — CLONAZEPAM 0.5 MG PO TABS: ORAL_TABLET | ORAL | Status: DC

## 2012-03-12 NOTE — Telephone Encounter (Signed)
Faxed refill request from [facility], this was last filled on 02/21/12.

## 2012-03-12 NOTE — Telephone Encounter (Signed)
[facility] 

## 2012-03-27 ENCOUNTER — Other Ambulatory Visit: Payer: Self-pay | Admitting: *Deleted

## 2012-03-27 MED ORDER — ZOLPIDEM TARTRATE ER 12.5 MG PO TBCR: 12.5000 mg | EXTENDED_RELEASE_TABLET | Freq: Every evening | ORAL | Status: DC | PRN

## 2012-03-27 MED ORDER — CLONAZEPAM 0.5 MG PO TABS: ORAL_TABLET | ORAL | Status: DC

## 2012-03-27 NOTE — Telephone Encounter (Signed)
Received refill request from pharmacy. Last office visit 11/24/11. Is it okay to refill medication?

## 2012-03-27 NOTE — Telephone Encounter (Signed)
Medicines called to pharmacy. 

## 2012-04-05 ENCOUNTER — Ambulatory Visit: Payer: BC Managed Care – PPO | Admitting: Psychiatry

## 2012-04-10 ENCOUNTER — Ambulatory Visit (INDEPENDENT_AMBULATORY_CARE_PROVIDER_SITE_OTHER): Payer: BC Managed Care – PPO | Admitting: Psychiatry

## 2012-04-10 DIAGNOSIS — F4322 Adjustment disorder with anxiety: Secondary | ICD-10-CM

## 2012-04-13 ENCOUNTER — Telehealth: Payer: Self-pay | Admitting: Family Medicine

## 2012-04-13 NOTE — Telephone Encounter (Signed)
Pt called wanting to know if we had record of her MMR and tetanus vaccinations. Please advise.

## 2012-04-15 NOTE — Telephone Encounter (Signed)
Laurie, please look into this. 

## 2012-04-17 NOTE — Telephone Encounter (Signed)
Left message advising pt that I cant find this information in her chart, asked her to call so that I can get more information.

## 2012-04-23 ENCOUNTER — Other Ambulatory Visit: Payer: Self-pay | Admitting: *Deleted

## 2012-04-23 MED ORDER — CLONAZEPAM 0.5 MG PO TABS: ORAL_TABLET | ORAL | Status: DC

## 2012-04-23 NOTE — Telephone Encounter (Signed)
Faxed refill request from [facility] [address].

## 2012-04-23 NOTE — Telephone Encounter (Signed)
[facility] 

## 2012-04-27 ENCOUNTER — Other Ambulatory Visit: Payer: Self-pay

## 2012-04-27 MED ORDER — VALACYCLOVIR HCL 500 MG PO TABS: 500.0000 mg | ORAL_TABLET | Freq: Two times a day (BID) | ORAL | Status: DC | PRN

## 2012-04-27 NOTE — Telephone Encounter (Signed)
Pt left v/m requesting valacyclovir to [facility]; pt not sure if Dr Dayton Martes had prescribed before.Please advise.

## 2012-05-02 ENCOUNTER — Other Ambulatory Visit: Payer: Self-pay | Admitting: *Deleted

## 2012-05-02 MED ORDER — ZOLPIDEM TARTRATE ER 12.5 MG PO TBCR: 12.5000 mg | EXTENDED_RELEASE_TABLET | Freq: Every evening | ORAL | Status: DC | PRN

## 2012-05-02 NOTE — Telephone Encounter (Signed)
Faxed refill request from [facility], last filled 03/27/12.

## 2012-05-02 NOTE — Telephone Encounter (Signed)
Medicine called to pharmacy. 

## 2012-05-03 ENCOUNTER — Ambulatory Visit (INDEPENDENT_AMBULATORY_CARE_PROVIDER_SITE_OTHER): Payer: BC Managed Care – PPO | Admitting: Psychiatry

## 2012-05-03 DIAGNOSIS — F4323 Adjustment disorder with mixed anxiety and depressed mood: Secondary | ICD-10-CM

## 2012-05-05 ENCOUNTER — Other Ambulatory Visit: Payer: Self-pay | Admitting: Family Medicine

## 2012-05-08 ENCOUNTER — Other Ambulatory Visit: Payer: Self-pay | Admitting: Family Medicine

## 2012-05-21 ENCOUNTER — Other Ambulatory Visit: Payer: Self-pay | Admitting: Family Medicine

## 2012-05-21 NOTE — Telephone Encounter (Signed)
Medicine called to pharmacy. 

## 2012-05-25 ENCOUNTER — Encounter: Payer: Self-pay | Admitting: Family Medicine

## 2012-05-25 ENCOUNTER — Ambulatory Visit (INDEPENDENT_AMBULATORY_CARE_PROVIDER_SITE_OTHER): Payer: BC Managed Care – PPO | Admitting: Family Medicine

## 2012-05-25 VITALS — BP 118/78 | HR 72 | Temp 98.0°F | Wt 175.0 lb

## 2012-05-25 DIAGNOSIS — R300 Dysuria: Secondary | ICD-10-CM

## 2012-05-25 LAB — POCT URINALYSIS DIPSTICK
Bilirubin, UA: NEGATIVE
Ketones, UA: NEGATIVE
Spec Grav, UA: <=1.005
pH, UA: 7.5

## 2012-05-25 MED ORDER — CIPROFLOXACIN HCL 500 MG PO TABS: 500.0000 mg | ORAL_TABLET | Freq: Two times a day (BID) | ORAL | Status: AC

## 2012-05-25 NOTE — Patient Instructions (Addendum)
Great to see you. Take cipro as directed- 1 tablet twice daily for 1 week. We will call you with your urine culture results.

## 2012-05-25 NOTE — Progress Notes (Signed)
Subjective:    Patient ID: [name], female    DOB: Jun 05, 1971, 41 y.o.   MRN: [id number]  HPI  41 yo with h/o pyelo in 09/2011, cervical disc disease and fibromyalgia here for:  ?UTI- since last night, increased frequency, +/- some suprapubic discomfort.  No dysuria  but she did not have many symptoms originally when she had pyelo either.  No fevers, chills, nausea or vomiting.  Last UTI was in April 2013- urine cx grew >100,000 colonies (multiple bacteria), treated her with cipro as she was symptomatic.   Patient Active Problem List  Diagnosis  . STREP THROAT  . GENITAL HERPES  . Other and unspecified hyperlipidemia  . INSOMNIA, CHRONIC  . GERD  . HERNIATED LUMBOSACRAL DISC  . SYNCOPE  . FATIGUE  . BULIMIA, HX OF  . Fatigue  . Fibromyalgia  . Difficulty concentrating  . Pyelonephritis  . Dysuria  . Neck pain  . Cervical pain (neck)   Past Medical History  Diagnosis Date  . Fibromyalgia     Dr. Corliss Skains  . Genital herpes   . Bulimia nervosa     history  . Bulging disc     T7/8-receiving ESI's (Dr. Charlett Blake)   Past Surgical History  Procedure Date  . Cesarean section   . Abdominal hysterectomy     endometriosis  . Rotator cuff repair   . Parotidectomy 2009    benign tumor   History  Substance Use Topics  . Smoking status: Never Smoker   . Smokeless tobacco: Not on file  . Alcohol Use: Yes   Family History  Problem Relation Age of Onset  . Lung cancer Mother   . Stroke Father   . Breast cancer Maternal Aunt 54   Allergies  Allergen Reactions  . Codeine     REACTION: GI upset   Current Outpatient Prescriptions on File Prior to Visit  Medication Sig Dispense Refill  . buPROPion (WELLBUTRIN XL) 300 MG 24 hr tablet TAKE 1 TABLET BY MOUTH DAILY.  30 tablet  2  . clonazePAM (KLONOPIN) 0.5 MG tablet TAKE 1 TABLET BY MOUTH AT BEDTIME  30 tablet  0  . cyclobenzaprine (FLEXERIL) 10 MG tablet TAKE 1 TABLET BY MOUTH AT BEDTIME AS NEEDED FOR MUSCLE  SPASMS.  30 tablet  0  . lansoprazole (PREVACID) 30 MG capsule TAKE 1 CAPSULE BY MOUTH ONCE A DAY  30 capsule  12  . SAVELLA 50 MG TABS TAKE 1 TABLET (50 MG TOTAL) BY MOUTH 2 (TWO) TIMES DAILY.  60 tablet  0  . simvastatin (ZOCOR) 10 MG tablet TAKE 1 TABLET BY MOUTH AT BEDTIME  90 tablet  3  . traMADol (ULTRAM) 50 MG tablet TAKE 1 TABLET BY MOUTH 3 TIMES A DAY AS NEEDED FOR PAIN  90 tablet  0  . valACYclovir (VALTREX) 500 MG tablet Take 1 tablet (500 mg total) by mouth 2 (two) times daily as needed.  60 tablet  1  . vitamin B-12 (CYANOCOBALAMIN) 500 MCG tablet Take 500-1,500 mcg by mouth daily as needed.        . zolpidem (AMBIEN CR) 12.5 MG CR tablet Take 1 tablet (12.5 mg total) by mouth at bedtime as needed.  30 tablet  0   The PMH, PSH, Social History, Family History, Medications, and allergies have been reviewed in [facility], and have been updated if relevant.   Review of Systems See HPI No dysuria, no fevers, no nausea, no vomiting.    Objective:  Physical Exam BP 118/78  Pulse 72  Temp 98 F (36.7 C)  Wt 175 lb (79.379 kg)  General:  Well-developed,well-nourished,in no acute distress; alert,appropriate and cooperative throughout examination Head:  normocephalic and atraumatic.   Mouth:  good dentition.   Abdomen:  Bowel sounds positive,abdomen soft and non-tender without masses, organomegaly or hernias noted. No CVA tenderness Msk:  No deformity or scoliosis noted of thoracic or lumbar spine.   Extremities:  No clubbing, cyanosis, edema, or deformity noted with normal full range of motion of all joints.     Skin:  Intact without suspicious lesions or rashes Psych:  Cognition and judgment appear intact. Alert and cooperative with normal attention span and concentration. No apparent delusions, illusions, hallucinations      Assessment & Plan:   1. ?UTI UA pos for RBCs only. Will treat with cipro 500 mg twice daily for 7 days and send urine for cx given her history. The patient  indicates understanding of these issues and agrees with the plan.  POCT urinalysis dipstick, Urine culture

## 2012-05-25 NOTE — Addendum Note (Signed)
Addended by: Eliezer Bottom on: 05/25/2012 02:09 PM   Modules accepted: Orders

## 2012-05-29 ENCOUNTER — Other Ambulatory Visit: Payer: Self-pay | Admitting: Family Medicine

## 2012-05-29 NOTE — Telephone Encounter (Signed)
Medicine [facility]. 

## 2012-06-01 ENCOUNTER — Telehealth: Payer: Self-pay

## 2012-06-01 ENCOUNTER — Ambulatory Visit (INDEPENDENT_AMBULATORY_CARE_PROVIDER_SITE_OTHER): Payer: BC Managed Care – PPO | Admitting: Family Medicine

## 2012-06-01 DIAGNOSIS — R300 Dysuria: Secondary | ICD-10-CM

## 2012-06-01 LAB — POCT URINALYSIS DIPSTICK
Glucose, UA: NEGATIVE
Ketones, UA: NEGATIVE
Leukocytes, UA: NEGATIVE
Protein, UA: NEGATIVE
Spec Grav, UA: 1.015

## 2012-06-01 NOTE — Telephone Encounter (Signed)
Pt finished Cipro for UTI 05/30/12. Today beginning to feel urgency and frequency of urine returning. Pt does not have fever or pain; pt said does not usually have these symptoms with UTI. [facility].Please advise.

## 2012-06-01 NOTE — Telephone Encounter (Signed)
Advised patient.  She will come in to leave urine sample.  Please advise what to order.

## 2012-06-01 NOTE — Progress Notes (Signed)
  Subjective:    Patient ID: [name], female    DOB: [birth year], 41 y.o.   MRN: [id number]  HPI  Pt dropped off urine sample - having symptoms of dysuria.   Review of Systems     Objective:   Physical Exam        Assessment & Plan:  UA neg except for trace blood. Send urine for cx. Encourage drinking more fluids.

## 2012-06-01 NOTE — Telephone Encounter (Signed)
UA and urine cx if pos.

## 2012-06-01 NOTE — Telephone Encounter (Signed)
Sample collected.

## 2012-06-01 NOTE — Telephone Encounter (Signed)
Can she come in now?  We can recheck her urine because her urine cx grew very minimal amount of bacteria.

## 2012-06-01 NOTE — Telephone Encounter (Signed)
Left message asking patient to call back

## 2012-06-03 LAB — URINE CULTURE
Colony Count: NO GROWTH
Organism ID, Bacteria: NO GROWTH

## 2012-06-04 ENCOUNTER — Other Ambulatory Visit: Payer: Self-pay | Admitting: Family Medicine

## 2012-06-04 DIAGNOSIS — Z1231 Encounter for screening mammogram for malignant neoplasm of breast: Secondary | ICD-10-CM

## 2012-06-04 MED ORDER — FLUCONAZOLE 150 MG PO TABS: 150.0000 mg | ORAL_TABLET | Freq: Once | ORAL | Status: AC

## 2012-06-04 NOTE — Telephone Encounter (Signed)
Pt c/o yeast infection from abx, request diflucan called in for her.

## 2012-06-04 NOTE — Telephone Encounter (Signed)
Refill request for simvastatin.  Pt hasnt had lipids checked since 08/2010.  Please advise on refills.

## 2012-06-04 NOTE — Telephone Encounter (Signed)
Ok to refill one month- needs lipids and CMET checked for further refills.

## 2012-06-17 ENCOUNTER — Other Ambulatory Visit: Payer: Self-pay | Admitting: Family Medicine

## 2012-06-18 NOTE — Telephone Encounter (Signed)
[facility] 

## 2012-06-25 ENCOUNTER — Other Ambulatory Visit: Payer: Self-pay | Admitting: Family Medicine

## 2012-06-25 NOTE — Telephone Encounter (Signed)
Received refill request electronically from pharmacy. Last office visit 06/01/12. Is it okay to refill medication?

## 2012-06-26 NOTE — Telephone Encounter (Signed)
[facility] 

## 2012-07-03 ENCOUNTER — Ambulatory Visit (INDEPENDENT_AMBULATORY_CARE_PROVIDER_SITE_OTHER): Payer: BC Managed Care – PPO | Admitting: Psychiatry

## 2012-07-03 DIAGNOSIS — F4322 Adjustment disorder with anxiety: Secondary | ICD-10-CM

## 2012-07-14 ENCOUNTER — Other Ambulatory Visit: Payer: Self-pay | Admitting: Family Medicine

## 2012-07-16 ENCOUNTER — Ambulatory Visit
Admission: RE | Admit: 2012-07-16 | Discharge: 2012-07-16 | Disposition: A | Discharge status: Home / Self Care | Payer: BC Managed Care – PPO | Source: Ambulatory Visit | Attending: Family Medicine | Admitting: Family Medicine

## 2012-07-16 DIAGNOSIS — Z1231 Encounter for screening mammogram for malignant neoplasm of breast: Secondary | ICD-10-CM

## 2012-07-16 NOTE — Telephone Encounter (Signed)
Medicine called to pharmacy. 

## 2012-07-26 ENCOUNTER — Other Ambulatory Visit: Payer: Self-pay | Admitting: Family Medicine

## 2012-07-27 NOTE — Telephone Encounter (Signed)
[facility] 

## 2012-08-06 ENCOUNTER — Other Ambulatory Visit: Payer: Self-pay | Admitting: Family Medicine

## 2012-08-16 ENCOUNTER — Other Ambulatory Visit: Payer: Self-pay | Admitting: Family Medicine

## 2012-08-16 NOTE — Telephone Encounter (Signed)
[facility]

## 2012-08-16 NOTE — Telephone Encounter (Signed)
Pt has only been seen for dysuria in the past year, has no upcoming appts.

## 2012-08-24 ENCOUNTER — Other Ambulatory Visit: Payer: Self-pay | Admitting: Family Medicine

## 2012-08-27 NOTE — Telephone Encounter (Signed)
Rx called to pharmacy

## 2012-09-04 ENCOUNTER — Other Ambulatory Visit: Payer: Self-pay | Admitting: Family Medicine

## 2012-09-06 ENCOUNTER — Other Ambulatory Visit: Payer: Self-pay | Admitting: Family Medicine

## 2012-09-06 ENCOUNTER — Encounter: Payer: Self-pay | Admitting: Internal Medicine

## 2012-09-06 ENCOUNTER — Ambulatory Visit (INDEPENDENT_AMBULATORY_CARE_PROVIDER_SITE_OTHER): Payer: BC Managed Care – PPO | Admitting: Internal Medicine

## 2012-09-06 VITALS — BP 120/80 | HR 102 | Temp 98.3°F | Wt 174.0 lb

## 2012-09-06 DIAGNOSIS — A09 Infectious gastroenteritis and colitis, unspecified: Secondary | ICD-10-CM | POA: Insufficient documentation

## 2012-09-06 NOTE — Progress Notes (Signed)
Subjective:    Patient ID: [name], female    DOB: 10/21/1970, 42 y.o.   MRN: [id number]  HPI After dinner yesterday, started with some cramps Diarrhea started about 8PM Had to go every hour through the night Noted pink discoloration ~1AM Then solid material gone and she was seeing just blood Is having some "uncomfortable" feeling  Feels feverish but no measured fever Some sweats and chills last night Some aching  Some nausea last night but no vomiting Has appetite but afraid to eat  Did take imodium once last night  Current Outpatient Prescriptions on File Prior to Visit  Medication Sig Dispense Refill  . buPROPion (WELLBUTRIN XL) 300 MG 24 hr tablet TAKE 1 TABLET BY MOUTH EVERY DAY  30 tablet  2  . clonazePAM (KLONOPIN) 0.5 MG tablet TAKE 1 TABLET BY MOUTH AT BEDTIME  30 tablet  0  . cyclobenzaprine (FLEXERIL) 10 MG tablet TAKE 1 TABLET BY MOUTH AT BEDTIME AS NEEDED FOR MUSCLE SPASMS.  30 tablet  0  . lansoprazole (PREVACID) 30 MG capsule TAKE 1 CAPSULE BY MOUTH ONCE A DAY  30 capsule  3  . simvastatin (ZOCOR) 10 MG tablet TAKE 1 TABLET BY MOUTH AT BEDTIME  90 tablet  0  . traMADol (ULTRAM) 50 MG tablet TAKE 1 TABLET BY MOUTH 3 TIMES A DAY AS NEEDED FOR PAIN  90 tablet  0  . valACYclovir (VALTREX) 500 MG tablet Take 1 tablet (500 mg total) by mouth 2 (two) times daily as needed.  60 tablet  1  . zolpidem (AMBIEN CR) 12.5 MG CR tablet TAKE 1 TABLET AT BEDTIME  30 tablet  0    Allergies  Allergen Reactions  . Codeine     REACTION: GI upset    Past Medical History  Diagnosis Date  . Fibromyalgia     Dr. Corliss Skains  . Genital herpes   . Bulimia nervosa     history  . Bulging disc     T7/8-receiving ESI's (Dr. Charlett Blake)    Past Surgical History  Procedure Date  . Cesarean section   . Abdominal hysterectomy     endometriosis  . Rotator cuff repair   . Parotidectomy 2009    benign tumor    Family History  Problem Relation Age of Onset  . Lung cancer  Mother   . Stroke Father   . Breast cancer Maternal Aunt 15    History   Social History  . Marital Status: Legally Separated    Spouse Name: N/A    Number of Children: 1  . Years of Education: N/A   Occupational History  . IT Emergency planning/management officer    Social History Main Topics  . Smoking status: Never Smoker   . Smokeless tobacco: Never Used  . Alcohol Use: Yes  . Drug Use: Not on file  . Sexually Active: Not on file   Other Topics Concern  . Not on file   Social History Narrative   Regular exercise-yesPlays softball regularlyHas 1 daughter (13 y/o)Lives in [place] Emergency planning/management officer for [employer]   Review of Systems No cough No SOB No dysuria Some low back pain No dizziness    Objective:   Physical Exam  Constitutional: She appears well-developed and well-nourished. No distress.        No orthostatic dizziness  Neck: Normal range of motion. Neck supple.  Pulmonary/Chest: Effort normal and breath sounds normal. No respiratory distress. She has no wheezes. She has no rales.  Abdominal: Soft. Bowel sounds are normal. She exhibits no distension and no mass. There is tenderness. There is no rebound and no guarding.       Mild epigastric and RLQ tenderness  Lymphadenopathy:    She has no cervical adenopathy.          Assessment & Plan:

## 2012-09-06 NOTE — Patient Instructions (Signed)
Please drink plenty of fluids and small amounts of food regularly. Start the align again. Call if your diarrhea, cramps and blood persist for more than 3-4 days

## 2012-09-06 NOTE — Telephone Encounter (Signed)
Per pharmacy, too soon to refill.  Request cancelled at pharmacy.

## 2012-09-06 NOTE — Assessment & Plan Note (Signed)
Cramping with bloody diarrhea No sig fever No dehydration Doesn't feel too bad Able to tolerate oral liquids and food  Reviewed Up to Date Will just use supportive care Consider empiric antibiotic if not improving after a several days Keep up oral fluids

## 2012-09-11 ENCOUNTER — Ambulatory Visit: Payer: BC Managed Care – PPO | Admitting: Psychiatry

## 2012-09-14 ENCOUNTER — Other Ambulatory Visit: Payer: Self-pay | Admitting: Family Medicine

## 2012-09-14 ENCOUNTER — Ambulatory Visit (INDEPENDENT_AMBULATORY_CARE_PROVIDER_SITE_OTHER): Payer: BC Managed Care – PPO | Admitting: Family Medicine

## 2012-09-14 ENCOUNTER — Encounter: Payer: Self-pay | Admitting: Family Medicine

## 2012-09-14 VITALS — BP 108/74 | HR 80 | Temp 98.1°F | Wt 175.0 lb

## 2012-09-14 DIAGNOSIS — K529 Noninfective gastroenteritis and colitis, unspecified: Secondary | ICD-10-CM

## 2012-09-14 DIAGNOSIS — K5289 Other specified noninfective gastroenteritis and colitis: Secondary | ICD-10-CM

## 2012-09-14 MED ORDER — OXYCODONE-ACETAMINOPHEN 5-325 MG PO TABS: 1.0000 | ORAL_TABLET | Freq: Three times a day (TID) | ORAL | Status: DC | PRN

## 2012-09-14 MED ORDER — ONDANSETRON HCL 4 MG PO TABS: 4.0000 mg | ORAL_TABLET | Freq: Three times a day (TID) | ORAL | Status: DC | PRN

## 2012-09-14 NOTE — Progress Notes (Signed)
(  S) [name] is a 42 y.o. female with complaint of gastrointestinal symptoms of fevers, watery diarrhea, abdominal pain, nausea, vomiting for 6 days. No blood in stool.  Has not vomited since yesterday but still nauseated.  Symptoms are slowly improving and she feels she is staying hydrated.  Patient Active Problem List  Diagnosis  . STREP THROAT  . GENITAL HERPES  . Other and unspecified hyperlipidemia  . INSOMNIA, CHRONIC  . GERD  . HERNIATED LUMBOSACRAL DISC  . SYNCOPE  . FATIGUE  . BULIMIA, HX OF  . Fatigue  . Fibromyalgia  . Difficulty concentrating  . Pyelonephritis  . Dysuria  . Neck pain  . Cervical pain (neck)  . Dysentery   Past Medical History  Diagnosis Date  . Fibromyalgia     Dr. Corliss Skains  . Genital herpes   . Bulimia nervosa     history  . Bulging disc     T7/8-receiving ESI's (Dr. Charlett Blake)   Past Surgical History  Procedure Date  . Cesarean section   . Abdominal hysterectomy     endometriosis  . Rotator cuff repair   . Parotidectomy 2009    benign tumor   History  Substance Use Topics  . Smoking status: Never Smoker   . Smokeless tobacco: Never Used  . Alcohol Use: Yes   Family History  Problem Relation Age of Onset  . Lung cancer Mother   . Stroke Father   . Breast cancer Maternal Aunt 54   Allergies  Allergen Reactions  . Codeine     REACTION: GI upset   Current Outpatient Prescriptions on File Prior to Visit  Medication Sig Dispense Refill  . buPROPion (WELLBUTRIN XL) 300 MG 24 hr tablet TAKE 1 TABLET BY MOUTH EVERY DAY  30 tablet  2  . clonazePAM (KLONOPIN) 0.5 MG tablet TAKE 1 TABLET BY MOUTH AT BEDTIME  30 tablet  0  . cyclobenzaprine (FLEXERIL) 10 MG tablet TAKE 1 TABLET BY MOUTH AT BEDTIME AS NEEDED FOR MUSCLE SPASMS.  30 tablet  0  . lansoprazole (PREVACID) 30 MG capsule TAKE 1 CAPSULE BY MOUTH ONCE A DAY  30 capsule  3  . simvastatin (ZOCOR) 10 MG tablet TAKE 1 TABLET BY MOUTH AT BEDTIME  90 tablet  0  . traMADol  (ULTRAM) 50 MG tablet TAKE 1 TABLET BY MOUTH 3 TIMES A DAY AS NEEDED FOR PAIN  90 tablet  0  . valACYclovir (VALTREX) 500 MG tablet Take 1 tablet (500 mg total) by mouth 2 (two) times daily as needed.  60 tablet  1  . zolpidem (AMBIEN CR) 12.5 MG CR tablet TAKE 1 TABLET AT BEDTIME  30 tablet  0   The PMH, PSH, Social History, Family History, Medications, and allergies have been reviewed in [facility], and have been updated if relevant.  (O) Physical exam reveals the patient appears well. Hydration status: well hydrated. Abdomen: abdomen is soft without significant tenderness, masses, organomegaly or guarding..  (A) Viral Gastroenteritis  (P) I have recommended small amounts clear fluids frequently, soups, juices, water and advance diet as tolerated. Return office visit if symptoms persist or worsen; I have alerted the patient to call if high fever, dehydration, marked weakness, fainting, increased abdominal pain, blood in stool or vomit.

## 2012-09-14 NOTE — Telephone Encounter (Signed)
[facility] 

## 2012-09-14 NOTE — Patient Instructions (Signed)

## 2012-09-21 ENCOUNTER — Encounter: Payer: Self-pay | Admitting: Family Medicine

## 2012-09-21 ENCOUNTER — Ambulatory Visit (INDEPENDENT_AMBULATORY_CARE_PROVIDER_SITE_OTHER): Payer: BC Managed Care – PPO | Admitting: Family Medicine

## 2012-09-21 VITALS — BP 110/82 | HR 84 | Temp 97.5°F | Wt 177.0 lb

## 2012-09-21 DIAGNOSIS — Z87448 Personal history of other diseases of urinary system: Secondary | ICD-10-CM

## 2012-09-21 DIAGNOSIS — Z87440 Personal history of urinary (tract) infections: Secondary | ICD-10-CM

## 2012-09-21 DIAGNOSIS — R42 Dizziness and giddiness: Secondary | ICD-10-CM | POA: Insufficient documentation

## 2012-09-21 LAB — COMPREHENSIVE METABOLIC PANEL
AST: 16 U/L (ref 0–37)
Albumin: 3.8 g/dL (ref 3.5–5.2)
BUN: 9 mg/dL (ref 6–23)
CO2: 29 mEq/L (ref 19–32)
Calcium: 9.3 mg/dL (ref 8.4–10.5)
Chloride: 103 mEq/L (ref 96–112)
Potassium: 4.1 mEq/L (ref 3.5–5.3)

## 2012-09-21 LAB — POCT URINALYSIS DIPSTICK
Glucose, UA: NEGATIVE
Nitrite, UA: NEGATIVE
Urobilinogen, UA: NEGATIVE
pH, UA: 6.5

## 2012-09-21 MED ORDER — MECLIZINE HCL 50 MG PO TABS: 50.0000 mg | ORAL_TABLET | Freq: Three times a day (TID) | ORAL | Status: DC | PRN

## 2012-09-21 NOTE — Progress Notes (Signed)
Subjective:    Patient ID: [name], female    DOB: 1971/07/15, 42 y.o.   MRN: [id number]  HPI  42 yo here for dizziness since this yesterday.  Saw her last week for symptoms consistent with gastroenteritis- nausea, vomiting, diarrhea.  Another sick contact in home with similar symptoms.  Those symptoms have resolved.  Took prn Zofran once for her gastroenteritis and those symptoms resolved.    Yesterday morning, developed acute dizziness  worsened by changes in head positions.  Zofran has helped with the nausea but not the dizziness.  Has had vertigo in past after a GI bug.  Per pt, went to ER and was told she was dehydrated.  Imaging of head was negative.  Does have h/o pyleo.  Denies any dysuria but did not have any dysuria when did have pyelonephritis.  She has been drinking gatorade.  Patient Active Problem List  Diagnosis  . GENITAL HERPES  . Other and unspecified hyperlipidemia  . INSOMNIA, CHRONIC  . GERD  . BULIMIA, HX OF  . Fibromyalgia  . Cervical pain (neck)   Past Medical History  Diagnosis Date  . Fibromyalgia     Dr. Corliss Skains  . Genital herpes   . Bulimia nervosa     history  . Bulging disc     T7/8-receiving ESI's (Dr. Charlett Blake)   Past Surgical History  Procedure Date  . Cesarean section   . Abdominal hysterectomy     endometriosis  . Rotator cuff repair   . Parotidectomy 2009    benign tumor   History  Substance Use Topics  . Smoking status: Never Smoker   . Smokeless tobacco: Never Used  . Alcohol Use: Yes   Family History  Problem Relation Age of Onset  . Lung cancer Mother   . Stroke Father   . Breast cancer Maternal Aunt 54   Allergies  Allergen Reactions  . Codeine     REACTION: GI upset   Current Outpatient Prescriptions on File Prior to Visit  Medication Sig Dispense Refill  . buPROPion (WELLBUTRIN XL) 300 MG 24 hr tablet TAKE 1 TABLET BY MOUTH EVERY DAY  30 tablet  2  . clonazePAM (KLONOPIN) 0.5 MG tablet TAKE 1 TABLET  BY MOUTH AT BEDTIME  30 tablet  0  . cyclobenzaprine (FLEXERIL) 10 MG tablet TAKE 1 TABLET BY MOUTH AT BEDTIME AS NEEDED FOR MUSCLE SPASMS.  30 tablet  0  . lansoprazole (PREVACID) 30 MG capsule TAKE 1 CAPSULE BY MOUTH ONCE A DAY  30 capsule  3  . ondansetron (ZOFRAN) 4 MG tablet Take 1 tablet (4 mg total) by mouth every 8 (eight) hours as needed for nausea.  20 tablet  0  . oxyCODONE-acetaminophen (ROXICET) 5-325 MG per tablet Take 1 tablet by mouth every 8 (eight) hours as needed for pain.  30 tablet  0  . simvastatin (ZOCOR) 10 MG tablet TAKE 1 TABLET BY MOUTH AT BEDTIME  90 tablet  0  . traMADol (ULTRAM) 50 MG tablet TAKE 1 TABLET BY MOUTH 3 TIMES A DAY AS NEEDED FOR PAIN  90 tablet  0  . valACYclovir (VALTREX) 500 MG tablet Take 1 tablet (500 mg total) by mouth 2 (two) times daily as needed.  60 tablet  1  . zolpidem (AMBIEN CR) 12.5 MG CR tablet TAKE 1 TABLET AT BEDTIME  30 tablet  0   The PMH, PSH, Social History, Family History, Medications, and allergies have been reviewed in [facility], and have been  updated if relevant.    Review of Systems See HPI    No fevers No CP or SOB Objective:   Physical Exam BP 110/82  Pulse 84  Temp 97.5 F (36.4 C)  Wt 177 lb (80.287 kg)  General:  Well-developed,well-nourished,in no acute distress; alert,appropriate and cooperative throughout examination Head:  normocephalic and atraumatic.   Eyes:  vision grossly intact, pupils equal, pupils round, and pupils reactive to light.   Pos slight nystagmus with changes in head position, + dizziness with changes in head position Ears:  R ear normal and L ear normal.   Nose:  no external deformity.   Mouth:  good dentition.   Lungs:  Normal respiratory effort, chest expands symmetrically. Lungs are clear to auscultation, no crackles or wheezes. Heart:  Normal rate and regular rhythm. S1 and S2 normal without gallop, murmur, click, rub or other extra sounds. Abdomen:  Bowel sounds positive,abdomen soft and  non-tender without masses, organomegaly or hernias noted. Msk:  No deformity or scoliosis noted of thoracic or lumbar spine.   Extremities:  No clubbing, cyanosis, edema, or deformity noted with normal full range of motion of all joints.   Neurologic:  alert & oriented X3 and gait normal.   Skin:  Intact without suspicious lesions or rashes Psych:  Cognition and judgment appear intact. Alert and cooperative with normal attention span and concentration. No apparent delusions, illusions, hallucinations     Assessment & Plan:   1. Vertigo  Comprehensive metabolic panel   New-  She has had this in past after gastroenteritis.  Will check for electrolyte abnormalities with CMET.  Urine neg except for trace blood- send for cx. Given rx for meclizine and advised hydration.  Call or return to clinic prn if these symptoms worsen or fail to improve as anticipated. See pt instructions for further details.

## 2012-09-21 NOTE — Addendum Note (Signed)
Addended by: Eliezer Bottom on: 09/21/2012 02:05 PM   Modules accepted: Orders

## 2012-09-21 NOTE — Patient Instructions (Addendum)
Good to see you, [name]. We will send your urine for culture and call you next week with results.  Please take Meclizine as needed for dizziness- this may also help with nausea so wait before you take a zofran.  Call me on Monday with an update.  Go to ER if symptoms progress over the weekend.

## 2012-09-23 ENCOUNTER — Other Ambulatory Visit: Payer: Self-pay | Admitting: Family Medicine

## 2012-09-23 LAB — URINE CULTURE: Organism ID, Bacteria: NO GROWTH

## 2012-09-28 ENCOUNTER — Ambulatory Visit: Payer: BC Managed Care – PPO | Admitting: Family Medicine

## 2012-10-05 ENCOUNTER — Encounter: Payer: Self-pay | Admitting: Family Medicine

## 2012-10-05 ENCOUNTER — Ambulatory Visit (INDEPENDENT_AMBULATORY_CARE_PROVIDER_SITE_OTHER): Payer: BC Managed Care – PPO | Admitting: Family Medicine

## 2012-10-05 VITALS — BP 110/82 | HR 92 | Temp 98.5°F | Wt 174.0 lb

## 2012-10-05 DIAGNOSIS — R42 Dizziness and giddiness: Secondary | ICD-10-CM

## 2012-10-05 MED ORDER — CYCLOBENZAPRINE HCL 10 MG PO TABS: ORAL_TABLET | ORAL | Status: DC

## 2012-10-05 MED ORDER — MECLIZINE HCL 50 MG PO TABS: 50.0000 mg | ORAL_TABLET | Freq: Three times a day (TID) | ORAL | Status: DC | PRN

## 2012-10-05 NOTE — Patient Instructions (Addendum)
Good to see you. Please stop by to see [name] on your way out to set up your ENT referral.   

## 2012-10-05 NOTE — Progress Notes (Signed)
Subjective:    Patient ID: [name], female    DOB: January 23, 1971, 42 y.o.   MRN: [id number]  HPI  42 yo here for persistent dizziness since 09/20/2012.  I initially saw her on 1/31 for symptoms consistent with gastroenteritis- nausea, vomiting, diarrhea.  Another sick contact in home with similar symptoms.  Those symptoms have resolved.  Took prn Zofran once for her gastroenteritis and those symptoms resolved.    On 2/6,  developed acute dizziness  worsened by changes in head positions.   Has had dizziness in past after a GI bug but she does not think it was vertigo, just dehydration.  Per pt, went to ER and was told she was dehydrated.  CT of head was negative.  Remote h/o of left parotidectomy.  I saw her again on 2/7 for vertigo symptoms. Does have h/o pyleo.  UA and urine cx were negative.  Given rx for meclizine which has helped.  Still has some symptoms of vertigo when she lays down and changes head position.      Patient Active Problem List  Diagnosis  . GENITAL HERPES  . Other and unspecified hyperlipidemia  . INSOMNIA, CHRONIC  . GERD  . BULIMIA, HX OF  . Fibromyalgia  . Cervical pain (neck)  . Vertigo   Past Medical History  Diagnosis Date  . Fibromyalgia     Dr. Corliss Skains  . Genital herpes   . Bulimia nervosa     history  . Bulging disc     T7/8-receiving ESI's (Dr. Charlett Blake)   Past Surgical History  Procedure Laterality Date  . Cesarean section    . Abdominal hysterectomy      endometriosis  . Rotator cuff repair    . Parotidectomy  2009    benign tumor   History  Substance Use Topics  . Smoking status: Never Smoker   . Smokeless tobacco: Never Used  . Alcohol Use: Yes   Family History  Problem Relation Age of Onset  . Lung cancer Mother   . Stroke Father   . Breast cancer Maternal Aunt 54   Allergies  Allergen Reactions  . Codeine     REACTION: GI upset   Current Outpatient Prescriptions on File Prior to Visit  Medication Sig  Dispense Refill  . buPROPion (WELLBUTRIN XL) 300 MG 24 hr tablet TAKE 1 TABLET BY MOUTH EVERY DAY  30 tablet  2  . clonazePAM (KLONOPIN) 0.5 MG tablet TAKE 1 TABLET BY MOUTH AT BEDTIME  30 tablet  0  . cyclobenzaprine (FLEXERIL) 10 MG tablet TAKE 1 TABLET BY MOUTH AT BEDTIME AS NEEDED FOR MUSCLE SPASMS.  30 tablet  0  . lansoprazole (PREVACID) 30 MG capsule TAKE 1 CAPSULE BY MOUTH ONCE A DAY  30 capsule  3  . meclizine (ANTIVERT) 50 MG tablet Take 1 tablet (50 mg total) by mouth 3 (three) times daily as needed for dizziness or nausea.  30 tablet  0  . ondansetron (ZOFRAN) 4 MG tablet Take 1 tablet (4 mg total) by mouth every 8 (eight) hours as needed for nausea.  20 tablet  0  . oxyCODONE-acetaminophen (ROXICET) 5-325 MG per tablet Take 1 tablet by mouth every 8 (eight) hours as needed for pain.  30 tablet  0  . simvastatin (ZOCOR) 10 MG tablet TAKE 1 TABLET BY MOUTH AT BEDTIME  90 tablet  0  . traMADol (ULTRAM) 50 MG tablet TAKE 1 TABLET BY MOUTH 3 TIMES A DAY AS NEEDED FOR PAIN  90 tablet  0  . valACYclovir (VALTREX) 500 MG tablet Take 1 tablet (500 mg total) by mouth 2 (two) times daily as needed.  60 tablet  1  . zolpidem (AMBIEN CR) 12.5 MG CR tablet TAKE 1 TABLET AT BEDTIME  30 tablet  0   No current facility-administered medications on file prior to visit.   The PMH, PSH, Social History, Family History, Medications, and allergies have been reviewed in [facility], and have been updated if relevant.    Review of Systems See HPI    No fevers No CP or SOB Objective:   Physical Exam BP 110/82  Pulse 92  Temp(Src) 98.5 F (36.9 C)  Wt 174 lb (78.926 kg)  BMI 30.34 kg/m2  General:  Well-developed,well-nourished,in no acute distress; alert,appropriate and cooperative throughout examination Head:  normocephalic and atraumatic.   Eyes:  vision grossly intact, pupils equal, pupils round, and pupils reactive to light.   Pos slight nystagmus with changes in head position, + dizziness with  changes in head position Ears:  R ear normal and L ear normal.   Nose:  no external deformity.   Mouth:  good dentition.   Lungs:  Normal respiratory effort, chest expands symmetrically. Lungs are clear to auscultation, no crackles or wheezes. Heart:  Normal rate and regular rhythm. S1 and S2 normal without gallop, murmur, click, rub or other extra sounds. Abdomen:  Bowel sounds positive,abdomen soft and non-tender without masses, organomegaly or hernias noted. Msk:  No deformity or scoliosis noted of thoracic or lumbar spine.   Extremities:  No clubbing, cyanosis, edema, or deformity noted with normal full range of motion of all joints.   Neurologic:  alert & oriented X3 and gait normal.   Skin:  Intact without suspicious lesions or rashes Psych:  Cognition and judgment appear intact. Alert and cooperative with normal attention span and concentration. No apparent delusions, illusions, hallucinations     Assessment & Plan:   Vertigo Improved but remains symptomatic.  Continue as needed meclizine. Will refer to ENT for further evaluation and ? Vestibular rehab. The patient indicates understanding of these issues and agrees with the plan.

## 2012-10-14 ENCOUNTER — Other Ambulatory Visit: Payer: Self-pay | Admitting: Family Medicine

## 2012-10-15 NOTE — Telephone Encounter (Signed)
[facility] 

## 2012-10-22 ENCOUNTER — Other Ambulatory Visit: Payer: Self-pay | Admitting: Family Medicine

## 2012-10-23 NOTE — Telephone Encounter (Signed)
Called to [facility]. 

## 2012-10-27 ENCOUNTER — Emergency Department (HOSPITAL_BASED_OUTPATIENT_CLINIC_OR_DEPARTMENT_OTHER)
Admission: EM | Admit: 2012-10-27 | Discharge: 2012-10-27 | Disposition: A | Discharge status: Home / Self Care | Payer: BC Managed Care – PPO | Attending: Emergency Medicine | Admitting: Emergency Medicine

## 2012-10-27 ENCOUNTER — Encounter (HOSPITAL_BASED_OUTPATIENT_CLINIC_OR_DEPARTMENT_OTHER): Payer: Self-pay | Admitting: *Deleted

## 2012-10-27 ENCOUNTER — Emergency Department (HOSPITAL_BASED_OUTPATIENT_CLINIC_OR_DEPARTMENT_OTHER): Payer: BC Managed Care – PPO

## 2012-10-27 DIAGNOSIS — IMO0001 Reserved for inherently not codable concepts without codable children: Secondary | ICD-10-CM | POA: Insufficient documentation

## 2012-10-27 DIAGNOSIS — S93401A Sprain of unspecified ligament of right ankle, initial encounter: Secondary | ICD-10-CM

## 2012-10-27 DIAGNOSIS — Z79899 Other long term (current) drug therapy: Secondary | ICD-10-CM | POA: Insufficient documentation

## 2012-10-27 DIAGNOSIS — Y9301 Activity, walking, marching and hiking: Secondary | ICD-10-CM | POA: Insufficient documentation

## 2012-10-27 DIAGNOSIS — Z8619 Personal history of other infectious and parasitic diseases: Secondary | ICD-10-CM | POA: Insufficient documentation

## 2012-10-27 DIAGNOSIS — S7000XA Contusion of unspecified hip, initial encounter: Secondary | ICD-10-CM | POA: Insufficient documentation

## 2012-10-27 DIAGNOSIS — Y929 Unspecified place or not applicable: Secondary | ICD-10-CM | POA: Insufficient documentation

## 2012-10-27 DIAGNOSIS — W010XXA Fall on same level from slipping, tripping and stumbling without subsequent striking against object, initial encounter: Secondary | ICD-10-CM | POA: Insufficient documentation

## 2012-10-27 DIAGNOSIS — S93409A Sprain of unspecified ligament of unspecified ankle, initial encounter: Secondary | ICD-10-CM | POA: Insufficient documentation

## 2012-10-27 DIAGNOSIS — Z8739 Personal history of other diseases of the musculoskeletal system and connective tissue: Secondary | ICD-10-CM | POA: Insufficient documentation

## 2012-10-27 DIAGNOSIS — S7001XA Contusion of right hip, initial encounter: Secondary | ICD-10-CM

## 2012-10-27 DIAGNOSIS — Z8669 Personal history of other diseases of the nervous system and sense organs: Secondary | ICD-10-CM | POA: Insufficient documentation

## 2012-10-27 MED ORDER — OXYCODONE-ACETAMINOPHEN 5-325 MG PO TABS
ORAL_TABLET | ORAL | Status: AC
  Filled 2012-10-27: qty 2

## 2012-10-27 MED ORDER — OXYCODONE-ACETAMINOPHEN 5-325 MG PO TABS
2.0000 | ORAL_TABLET | Freq: Once | ORAL | Status: AC
  Administered 2012-10-27: 2 via ORAL
  Filled 2012-10-27: qty 2

## 2012-10-27 MED ORDER — OXYCODONE-ACETAMINOPHEN 5-325 MG PO TABS: 1.0000 | ORAL_TABLET | Freq: Four times a day (QID) | ORAL | Status: DC | PRN

## 2012-10-27 NOTE — ED Notes (Signed)
Patient was walking her dog when she tripped and fell. Twisted her right ankle and landed oin right hip. C/o ankle and hip pain. Right ankle swollen

## 2012-10-27 NOTE — ED Provider Notes (Signed)
History     CSN: [id number]  Arrival date & time 10/27/12  1058   First MD Initiated Contact with Patient 10/27/12 1101      Chief Complaint  Patient presents with  . Fall    (Consider location/radiation/quality/duration/timing/severity/associated sxs/prior treatment) Patient is a 42 y.o. female presenting with fall.  Fall   Pt reports she was walking her dog just prior to arrival when she stumbled, twisted her R ankle and fell onto her right side. Complaining of moderate aching R ankle pain, worse with movement and bearing weight and severe aching R hip pain, worse with movement and bearing weight. She also hit her R elbow, but it is not painful. Denies head injury or LOC.   Past Medical History  Diagnosis Date  . Fibromyalgia     Dr. Corliss Skains  . Genital herpes   . Bulimia nervosa     history  . Bulging disc     T7/8-receiving ESI's (Dr. Charlett Blake)    Past Surgical History  Procedure Laterality Date  . Cesarean section    . Abdominal hysterectomy      endometriosis  . Rotator cuff repair    . Parotidectomy  2009    benign tumor    Family History  Problem Relation Age of Onset  . Lung cancer Mother   . Stroke Father   . Breast cancer Maternal Aunt 54    History  Substance Use Topics  . Smoking status: Never Smoker   . Smokeless tobacco: Never Used  . Alcohol Use: Yes    OB History   Grav Para Term Preterm Abortions TAB SAB Ect Mult Living                  Review of Systems All other systems reviewed and are negative except as noted in HPI.   Allergies  Codeine  Home Medications   Current Outpatient Rx  Name  Route  Sig  Dispense  Refill  . buPROPion (WELLBUTRIN XL) 300 MG 24 hr tablet      TAKE 1 TABLET BY MOUTH EVERY DAY   30 tablet   2   . clonazePAM (KLONOPIN) 0.5 MG tablet      TAKE 1 TABLET BY MOUTH AT BEDTIME   30 tablet   0   . cyclobenzaprine (FLEXERIL) 10 MG tablet      TAKE 1 TABLET BY MOUTH AT BEDTIME AS NEEDED FOR MUSCLE  SPASMS.   30 tablet   3   . lansoprazole (PREVACID) 30 MG capsule      TAKE 1 CAPSULE BY MOUTH ONCE A DAY   30 capsule   3   . meclizine (ANTIVERT) 50 MG tablet   Oral   Take 1 tablet (50 mg total) by mouth 3 (three) times daily as needed for dizziness or nausea.   30 tablet   0   . ondansetron (ZOFRAN) 4 MG tablet   Oral   Take 1 tablet (4 mg total) by mouth every 8 (eight) hours as needed for nausea.   20 tablet   0   . oxyCODONE-acetaminophen (ROXICET) 5-325 MG per tablet   Oral   Take 1 tablet by mouth every 8 (eight) hours as needed for pain.   30 tablet   0   . simvastatin (ZOCOR) 10 MG tablet      TAKE 1 TABLET BY MOUTH AT BEDTIME   90 tablet   0     MUST HAVE OFFICE VISIT BEFORE FURTHER REFILLS.   Marland Kitchen  traMADol (ULTRAM) 50 MG tablet      TAKE 1 TABLET BY MOUTH 3 TIMES A DAY AS NEEDED FOR PAIN   90 tablet   0   . valACYclovir (VALTREX) 500 MG tablet   Oral   Take 1 tablet (500 mg total) by mouth 2 (two) times daily as needed.   60 tablet   1   . zolpidem (AMBIEN CR) 12.5 MG CR tablet      TAKE 1 TABLET BY MOUTH AT BEDTIME   30 tablet   0     BP 107/81  Pulse 102  Temp(Src) 98.1 F (36.7 C) (Oral)  Resp 18  Ht 5\' 4"  (1.626 m)  Wt 170 lb (77.111 kg)  BMI 29.17 kg/m2  SpO2 100%  Physical Exam  Constitutional: She is oriented to person, place, and time. She appears well-developed and well-nourished.  HENT:  Head: Normocephalic and atraumatic.  Neck: Neck supple.  Pulmonary/Chest: Effort normal.  Musculoskeletal: She exhibits edema and tenderness.  Soft tissue swelling, but no bony tenderness in R ankle. She is sitting in a wheelchair which limits exam of upper leg, but tender to palpation of R hip laterally. Neurovascularly intact  Neurological: She is alert and oriented to person, place, and time. No cranial nerve deficit.  Psychiatric: She has a normal mood and affect. Her behavior is normal.    ED Course  Procedures (including critical  care time)  Labs Reviewed - No data to display Dg Hip Complete Right  10/27/2012  *RADIOLOGY REPORT*  Clinical Data: Fall.  Right hip injury and pain.  RIGHT HIP - COMPLETE 2+ VIEW  Comparison:  None.  Findings:  There is no evidence of hip fracture or dislocation. There is no evidence of arthropathy or other focal bone abnormality.  IMPRESSION: Negative.   Original Report Authenticated By: Myles Rosenthal, M.D.    Dg Ankle Complete Right  10/27/2012  *RADIOLOGY REPORT*  Clinical Data: Fall.  Lateral ankle pain and swelling.  RIGHT ANKLE - COMPLETE 3+ VIEW  Comparison: None.  Findings: Moderate soft tissue swelling is seen overlying the lateral malleolus.  No evidence of fracture or dislocation.  No evidence of ankle joint effusion.  No other significant bone abnormality identified.  IMPRESSION: Lateral soft tissue swelling.  No evidence of fracture.   Original Report Authenticated By: Myles Rosenthal, M.D.      No diagnosis found.    MDM  Xrays neg for fracture. Will give her ASO for ankle and crutches. Advised to stay off her R leg for a few days. Pain meds as needed PCP followup       Reg Bircher B. Bernette Mayers, MD 10/27/12 1151

## 2012-10-29 ENCOUNTER — Telehealth: Payer: Self-pay | Admitting: Family Medicine

## 2012-10-29 NOTE — Telephone Encounter (Signed)
Noted! Thank you

## 2012-10-29 NOTE — Telephone Encounter (Signed)
Spoke with patient.  She did go to ER, has a sprain in her ankle and a deep bruise in her hip, otherwise she states she's good to go.

## 2012-10-29 NOTE — Telephone Encounter (Signed)
Please call to check on pt. 

## 2012-10-29 NOTE — Telephone Encounter (Signed)
[facility] Triage Call Report Triage Record Num: [id number] Operator: Baldomero Lamy Patient Name: [name] Call Date & Time: 10/27/2012 9:31:42AM Patient Phone: [phone] PCP: Ruthe Mannan Patient Gender: Female PCP Fax : [phone] Patient DOB: 03-19-1971 Practice Name: [facility] Reason for Call: Caller: [name]/Patient; PCP: Ruthe Mannan (Family Practice); CB#: [phone]; Call regarding Hip injury; Pt calling regarding a left hip and ankle injury on 10/26/12 while walking dogs. Can be weight bearing if she must but severe pain. Edema and bruising to the ankle but none to hip. Pt has iced ankle only. Emergent sx of Hip Injury r/o. See Provider w/in 4 hrs for: New Moderate to severe pain. Pt to have daughter drive her to [facility] on [address], now. Care advice given. Protocol(s) Used: Hip Injury Recommended Outcome per Protocol: See Provider within 4 hours Reason for Outcome: New moderate to severe pain (able to bear some weight, limits normal activities) Care Advice: ~ 10/27/2012 9:52:33AM Page 1 of 1 [id number]

## 2012-11-06 ENCOUNTER — Ambulatory Visit (INDEPENDENT_AMBULATORY_CARE_PROVIDER_SITE_OTHER): Payer: BC Managed Care – PPO | Admitting: Psychiatry

## 2012-11-06 DIAGNOSIS — F4322 Adjustment disorder with anxiety: Secondary | ICD-10-CM

## 2012-11-13 ENCOUNTER — Other Ambulatory Visit: Payer: Self-pay | Admitting: Family Medicine

## 2012-11-14 NOTE — Telephone Encounter (Signed)
[facility] 

## 2012-11-15 ENCOUNTER — Ambulatory Visit (INDEPENDENT_AMBULATORY_CARE_PROVIDER_SITE_OTHER): Payer: BC Managed Care – PPO | Admitting: Psychiatry

## 2012-11-15 DIAGNOSIS — F4322 Adjustment disorder with anxiety: Secondary | ICD-10-CM

## 2012-11-17 ENCOUNTER — Other Ambulatory Visit: Payer: Self-pay | Admitting: Family Medicine

## 2012-11-19 NOTE — Telephone Encounter (Signed)
Last filled 08/03/13 

## 2012-11-24 ENCOUNTER — Other Ambulatory Visit: Payer: Self-pay | Admitting: Family Medicine

## 2012-11-26 NOTE — Telephone Encounter (Signed)
[facility] 

## 2012-11-27 ENCOUNTER — Ambulatory Visit (INDEPENDENT_AMBULATORY_CARE_PROVIDER_SITE_OTHER): Payer: BC Managed Care – PPO | Admitting: Psychiatry

## 2012-11-27 DIAGNOSIS — F4322 Adjustment disorder with anxiety: Secondary | ICD-10-CM

## 2012-11-28 ENCOUNTER — Other Ambulatory Visit: Payer: Self-pay | Admitting: Family Medicine

## 2012-12-11 ENCOUNTER — Ambulatory Visit: Payer: BC Managed Care – PPO | Admitting: Psychiatry

## 2012-12-17 ENCOUNTER — Other Ambulatory Visit: Payer: Self-pay | Admitting: Family Medicine

## 2012-12-18 NOTE — Telephone Encounter (Signed)
Prescription called to pharmacy as instructed. 

## 2012-12-18 NOTE — Telephone Encounter (Signed)
Received refill request electronically. Last office visit 10/05/12. Is it okay to refill?

## 2012-12-29 ENCOUNTER — Other Ambulatory Visit: Payer: Self-pay | Admitting: Family Medicine

## 2012-12-31 NOTE — Telephone Encounter (Signed)
[facility] 

## 2013-01-03 ENCOUNTER — Telehealth: Payer: Self-pay

## 2013-01-03 NOTE — Telephone Encounter (Signed)
[facility] faxed drug utilization review form for Zolpidem. Form in Dr Elmer Sow in box.

## 2013-01-03 NOTE — Telephone Encounter (Signed)
Completed and on my desk.

## 2013-01-09 ENCOUNTER — Ambulatory Visit (INDEPENDENT_AMBULATORY_CARE_PROVIDER_SITE_OTHER): Payer: BC Managed Care – PPO | Admitting: Psychiatry

## 2013-01-09 DIAGNOSIS — F4322 Adjustment disorder with anxiety: Secondary | ICD-10-CM

## 2013-01-16 ENCOUNTER — Other Ambulatory Visit: Payer: Self-pay | Admitting: Family Medicine

## 2013-01-16 NOTE — Telephone Encounter (Signed)
rx called to pharmacy 

## 2013-01-17 ENCOUNTER — Encounter: Payer: Self-pay | Admitting: Radiology

## 2013-01-18 ENCOUNTER — Encounter: Payer: Self-pay | Admitting: Family Medicine

## 2013-01-18 ENCOUNTER — Ambulatory Visit (INDEPENDENT_AMBULATORY_CARE_PROVIDER_SITE_OTHER): Payer: BC Managed Care – PPO | Admitting: Family Medicine

## 2013-01-18 VITALS — BP 116/90 | HR 92 | Temp 98.1°F | Wt 177.2 lb

## 2013-01-18 DIAGNOSIS — T148 Other injury of unspecified body region: Secondary | ICD-10-CM

## 2013-01-18 DIAGNOSIS — J029 Acute pharyngitis, unspecified: Secondary | ICD-10-CM

## 2013-01-18 DIAGNOSIS — J019 Acute sinusitis, unspecified: Secondary | ICD-10-CM | POA: Insufficient documentation

## 2013-01-18 DIAGNOSIS — W57XXXA Bitten or stung by nonvenomous insect and other nonvenomous arthropods, initial encounter: Secondary | ICD-10-CM | POA: Insufficient documentation

## 2013-01-18 NOTE — Progress Notes (Signed)
Seen with PA student Unice Cobble and agree with assessment and plan  CC:ST, tick bite.  3d h/o ST associated with fever/chills at home.  Nonproductive cough.  HA present.  Denies nausea/vomiting, diarrhea.  ST waking her up at night.  + sinus/head congestion.   Tried advil and dayquil.  H/o recurrent strep throat in past. No smokers at home.  No h/o asthma.  Also with 3 tick bites over last 2 weeks - 1 on R medial calf and 2 on R thigh.  New puppy - playing outside.  Noticing some joint pain in hands and feet - with sore toe - different from FM pain.  No rash.  No new HA present.    Past Medical History  Diagnosis Date  . Fibromyalgia     Dr. Corliss Skains  . Genital herpes   . Bulimia nervosa     history  . Bulging disc     T7/8-receiving ESI's (Dr. Charlett Blake)     PE:  NAD, CF, WDWN HEENT: PERRLA, EOMI, nares clear, TMs clear, oropharyngeal erythema, no LAD.  S/p L parotidectomy CVS: nl S1, S2, no m/r/g, slight tachycardia Pulm: CTAB, no crackles/wheezing Skin: healing papules on right leg - medial calf, medial thigh, and lateral thigh.  No residual tick parts

## 2013-01-18 NOTE — Progress Notes (Signed)
Subjective:    Patient ID: [name], female    DOB: 07/04/1971, 42 y.o.   MRN: [id number] CC: sore throat X 3 days and tick bite  HPI Patient complains of a sore throat that began 3 days ago.   She recalls a history of strep pharyngitis that usually requires treatment once a year in the summer months.  Reports pain in her neck, drainage, and "losing her voice".  She has a cough that isn't productive at this time.  Has a headache, but not sure if it is related to her sore throat or fibromyalgia.  She reports decreased appetite and waking up during the night because of her throat pain.  The patient has no known sick contacts.  She was bitten by a tick on her right calf about 2 weeks ago.  Another tick bit her on the right thigh 1 day later, and another on the right thigh 2 days after that.  She doesn't know how long the ticks were on her leg, but doesn't think it was more than a few hours.  The site where the tick bit her calf was the most painful and formed a blood blister after she was bitten.  This site was the slowest to heal.  All of the bites are healing well at this time, and the patient does not report any recent rashes.  She complains of pain in right calf in the morning, joint pain in her right big toe, fingers, and other small joints.  The patient reports that this pain is different than the muscle pain she experiences with her fibromyalgia.     Review of Systems  Constitutional: Positive for chills and appetite change.  HENT: Positive for ear pain, congestion, neck pain, voice change, postnasal drip and sinus pressure.   Eyes: Negative for pain and itching.  Respiratory: Positive for cough and chest tightness.   Cardiovascular: Negative for chest pain.  Gastrointestinal: Negative for nausea, vomiting and diarrhea.  Musculoskeletal: Positive for arthralgias.  Skin: Positive for wound. Negative for color change and rash.       Objective:   Physical Exam  Constitutional: She  appears well-developed and well-nourished. No distress.  HENT:  Head: Normocephalic.  Right Ear: External ear normal.  Left Ear: External ear normal.  Eyes: Pupils are equal, round, and reactive to light. Right eye exhibits no discharge. Left eye exhibits no discharge. Right conjunctiva is not injected. Left conjunctiva is not injected.  Neck: Normal range of motion. Neck supple.  Cardiovascular: Regular rhythm.  Tachycardia present.  Exam reveals no gallop and no friction rub.   No murmur heard. Pulmonary/Chest: Breath sounds normal. No accessory muscle usage or stridor. Not tachypneic. No respiratory distress. She has no wheezes. She has no rhonchi. She has no rales. She exhibits no tenderness.  Musculoskeletal: She exhibits no edema and no tenderness.  Skin: Lesion noted. No rash noted. She is not diaphoretic. No erythema. No pallor.  Psychiatric: Her speech is normal and behavior is normal. Thought content normal.          Assessment & Plan:  Sore throat: most likely viral pharyngitis-duration only 3 days -strep throat ruled out with neg strep test, and neg exudate -continue Dayquil and Advil as needed -if no improvement or worsening symptoms in the next few days call the office -drink plenty of fluids  Tick bites: Lyme Disesae, RMSF, or other tickborne illness not likely-no rash, duration of tick attachment short (<24 hrs) -patient instructed to to  watch for worsening headache, joint pain, neck stiffness, and new rash -hold doxycycline for now

## 2013-01-18 NOTE — Assessment & Plan Note (Signed)
Ticks present <4 hours.  Doubt tick borne illness.  Pt thinks it was lonestar tick. Red flags to monitor for discussed.

## 2013-01-18 NOTE — Patient Instructions (Addendum)
You have viral pharyngitis. Push fluids and plenty of rest. May use ibuprofen for throat inflammation. Salt water gargles. Suck on cold things like popsicles or warm things like herbal teas (whichever soothes the throat better). Return if fever >101.5, worsening pain, or trouble opening/closing mouth, or hoarse voice. Good to see you today, call clinic with questions. For tick bites - watch for fever >101.5, worsening headaches, or new rash.  If this happens, please let us know.  I don't think we had any tick borne illness.  Deer Tick Bite Deer ticks are brown arachnids (spider family) that vary in size from as small as the head of a pin to 1/4 inch (1/2 cm) diameter. They thrive in wooded areas. Deer are the preferred host of adult deer ticks. Small rodents are the host of young ticks (nymphs). When a person walks in a field or wooded area, young and adult ticks in the surrounding grass and vegetation can attach themselves to the skin. They can suck blood for hours to days if unnoticed. Ticks are found all over the [place] Some ticks carry a specific bacteria (Borrelia burgdorferi) that causes an infection called Lyme disease. The bacteria is typically passed into a person during the blood sucking process. This happens after the tick has been attached for at least a number of hours. While ticks can be found all over the [place] those carrying the bacteria that causes Lyme disease are most common in [place] and the [place]. Only a small proportion of ticks in these areas carry the Lyme disease bacteria and cause human infections. Ticks usually attach to warm spots on the body, such as the:  Head.  Back.  Neck.  Armpits.  Groin. SYMPTOMS  Most of the time, a deer tick bite will not be felt. You may or may not see the attached tick. You may notice mild irritation or redness around the bite site. If the deer tick passes the Lyme disease bacteria to a person, a round, red rash may be noticed 2 to  3 days after the bite. The rash may be clear in the middle, like a bull's-eye or target. If not treated, other symptoms may develop several days to weeks after the onset of the rash. These symptoms may include:  New rash lesions.  Fatigue and weakness.  General ill feeling and achiness.  Chills.  Headache and neck pain.  Swollen lymph glands.  Sore muscles and joints. 5 to 15% of untreated people with Lyme disease may develop more severe illnesses after several weeks to months. This may include inflammation of the brain lining (meningitis), nerve palsies, an abnormal heartbeat, or severe muscle and joint pain and inflammation (myositis or arthritis). DIAGNOSIS   Physical exam and medical history.  Viewing the tick if it was saved for confirmation.  Blood tests (to check or confirm the presence of Lyme disease). TREATMENT  Most ticks do not carry disease. If found, an attached tick should be removed using tweezers. Tweezers should be placed under the body of the tick so it is removed by its attachment parts (pincers). If there are signs or symptoms of being sick, or Lyme disease is confirmed, medicines (antibiotics) that kill germs are usually prescribed. In more severe cases, antibiotics may be given through an intravenous (IV) access. HOME CARE INSTRUCTIONS   Always remove ticks with tweezers. Do not use petroleum jelly or other methods to kill or remove the tick. Slide the tweezers under the body and pull out as much  as you can. If you are not sure what it is, save it in a jar and show your caregiver.  Once you remove the tick, the skin will heal on its own. Wash your hands and the affected area with water and soap. You may place a bandage on the affected area.  Take medicine as directed. You may be advised to take a full course of antibiotics.  Follow up with your caregiver as recommended. FINDING OUT THE RESULTS OF YOUR TEST Not all test results are available during your  visit. If your test results are not back during the visit, make an appointment with your caregiver to find out the results. Do not assume everything is normal if you have not heard from your caregiver or the medical facility. It is important for you to follow up on all of your test results. PROGNOSIS  If Lyme disease is confirmed, early treatment with antibiotics is very effective. Following preventive guidelines is important since it is possible to get the disease more than once. PREVENTION   Wear long sleeves and long pants in wooded or grassy areas. Tuck your pants into your socks.  Use an insect repellent while hiking.  Check yourself, your children, and your pets regularly for ticks after playing outside.  Clear piles of leaves or brush from your yard. Ticks might live there. SEEK MEDICAL CARE IF:   You or your child has an oral temperature above 102 F (38.9 C).  You develop a severe headache following the bite.  You feel generally ill.  You notice a rash.  You are having trouble removing the tick.  The bite area has red skin or yellow drainage. SEEK IMMEDIATE MEDICAL CARE IF:   Your face is weak and droopy or you have other neurological symptoms.  You have severe joint pain or weakness. MAKE SURE YOU:   Understand these instructions.  Will watch your condition.  Will get help right away if you are not doing well or get worse. FOR MORE INFORMATION Centers for Disease Control and Prevention: FootballExhibition.com.br American Academy of [employer]icians: www.https://powers.com/ Document Released: 10/26/2009 Document Revised: 10/24/2011 Document Reviewed: 10/26/2009 [facility] Patient Information 2014 [place], [place].

## 2013-01-18 NOTE — Assessment & Plan Note (Signed)
Doesn't sound like strep throat, however given hx recurrent strep infections, will check RST today. anticipate viral pharyngitis -discussed.

## 2013-01-22 ENCOUNTER — Other Ambulatory Visit: Payer: Self-pay | Admitting: Family Medicine

## 2013-01-22 NOTE — Telephone Encounter (Signed)
ambien called to [facility]

## 2013-01-23 ENCOUNTER — Telehealth: Payer: Self-pay | Admitting: Family Medicine

## 2013-01-23 MED ORDER — SCOPOLAMINE 1 MG/3DAYS TD PT72: 1.0000 | MEDICATED_PATCH | TRANSDERMAL | Status: DC

## 2013-01-23 NOTE — Telephone Encounter (Signed)
Caller: Elli/Patient; Phone: [phone]; Reason for Call: Patient states she is going on a 7 day cruise starting 01/25/13.  She asked if Dr.  Dayton Martes would call in Rx for "sea sickness patches".  [facility] [place] [place] [phone].  Pharmacy will text patient if Rx is received.

## 2013-01-24 ENCOUNTER — Encounter: Payer: Self-pay | Admitting: Radiology

## 2013-01-24 ENCOUNTER — Telehealth: Payer: Self-pay | Admitting: Family Medicine

## 2013-01-24 NOTE — Telephone Encounter (Signed)
Message left for patient to return my call.  

## 2013-01-24 NOTE — Telephone Encounter (Signed)
I see pt not feeling better and has scheduled f/u appt for tomorrow - can we call for an update - if sounds like persistent sinusitis (headache, facial pain, congestion, drainage and blowing nose with mucous, ear and tooth pain), would treat with doxycycline 100mg  bid x 10 days (#20) [may send in] and have her update [place] if sxs persist or worsen.

## 2013-01-25 ENCOUNTER — Ambulatory Visit (INDEPENDENT_AMBULATORY_CARE_PROVIDER_SITE_OTHER): Payer: BC Managed Care – PPO | Admitting: Family Medicine

## 2013-01-25 ENCOUNTER — Encounter: Payer: Self-pay | Admitting: Family Medicine

## 2013-01-25 VITALS — BP 110/78 | HR 88 | Temp 98.4°F | Wt 176.8 lb

## 2013-01-25 DIAGNOSIS — J019 Acute sinusitis, unspecified: Secondary | ICD-10-CM

## 2013-01-25 MED ORDER — FLUCONAZOLE 150 MG PO TABS: 150.0000 mg | ORAL_TABLET | Freq: Once | ORAL | Status: DC

## 2013-01-25 MED ORDER — DOXYCYCLINE HYCLATE 100 MG PO CAPS: 100.0000 mg | ORAL_CAPSULE | Freq: Two times a day (BID) | ORAL | Status: DC

## 2013-01-25 NOTE — Assessment & Plan Note (Addendum)
Given duration and progression of sxs, anticipate has developed bacterial infection. Will treat with 10d course of doxycycline. Pt agrees with plan. Pt requests diflucan if needed for yeast infection.

## 2013-01-25 NOTE — Patient Instructions (Addendum)
I'm sorry you're not feeling any better.  I think this has become sinusitis - treat with 10 d course of doxycycline. Continue fluids and rest. Continue advil. Let us know if not improving with this.

## 2013-01-25 NOTE — Progress Notes (Signed)
  Subjective:    Patient ID: [name], female    DOB: 07/18/1971, 42 y.o.   MRN: [id number]  HPI CC: not feeling better  Seen here 01/18/2013 with 3 d h/o URI sxs.  Thought viral at that time, recommended supportive care.  Presents today because not feeling better.  Bilateral ear pain now, continued headache.  Continued head pressure, hoarse voice for 2 days.  Mild cough, significant drainage. + tooth pain.  Decreased sense of smell ST better. No fevers/chills.  Tried advil and dayquil. No smokers at home.  Past Medical History  Diagnosis Date  . Fibromyalgia     Dr. Corliss Skains  . Genital herpes   . Bulimia nervosa     history  . Bulging disc     T7/8-receiving ESI's (Dr. Charlett Blake)     Review of Systems Per HPI    Objective:   Physical Exam  Vitals reviewed. Constitutional: She appears well-developed and well-nourished. No distress.  HENT:  Head: Normocephalic and atraumatic.  Right Ear: Hearing, tympanic membrane, external ear and ear canal normal.  Left Ear: Hearing, tympanic membrane, external ear and ear canal normal.  Nose: No mucosal edema or rhinorrhea. Right sinus exhibits no maxillary sinus tenderness and no frontal sinus tenderness. Left sinus exhibits frontal sinus tenderness. Left sinus exhibits no maxillary sinus tenderness.  Mouth/Throat: Uvula is midline, oropharynx is clear and moist and mucous membranes are normal. No oropharyngeal exudate, posterior oropharyngeal edema, posterior oropharyngeal erythema or tonsillar abscesses.  Eyes: Conjunctivae and EOM are normal. Pupils are equal, round, and reactive to light. No scleral icterus.  Neck: Normal range of motion. Neck supple.  Cardiovascular: Normal rate, regular rhythm, normal heart sounds and intact distal pulses.   No murmur heard. Pulmonary/Chest: Effort normal and breath sounds normal. No respiratory distress. She has no wheezes. She has no rales.  Lymphadenopathy:    She has no cervical adenopathy.   Skin: Skin is warm and dry. No rash noted.       Assessment & Plan:

## 2013-01-25 NOTE — Telephone Encounter (Signed)
Patient seen today

## 2013-01-28 ENCOUNTER — Telehealth: Payer: Self-pay

## 2013-01-28 MED ORDER — AMOXICILLIN-POT CLAVULANATE 875-125 MG PO TABS: 1.0000 | ORAL_TABLET | Freq: Two times a day (BID) | ORAL | Status: AC

## 2013-01-28 NOTE — Telephone Encounter (Signed)
Pt saw Dr Reece Agar on 01/25/13; pt said doxycycline causing pt to vomit and pt request [facility]. Pt said she feels better took 2 days of antibiotic before vomiting started.Please advise.

## 2013-01-28 NOTE — Telephone Encounter (Signed)
This was added to allergy list. Sent in augmentin.  Called and notified patient - left message on answering machine.

## 2013-02-08 ENCOUNTER — Other Ambulatory Visit: Payer: Self-pay | Admitting: *Deleted

## 2013-02-08 MED ORDER — OXYCODONE-ACETAMINOPHEN 5-325 MG PO TABS
1.0000 | ORAL_TABLET | Freq: Four times a day (QID) | ORAL | Status: DC | PRN
Start: 2013-02-08 — End: 2013-07-23

## 2013-02-08 NOTE — Telephone Encounter (Signed)
Left message advising patient script is ready for pick up. 

## 2013-02-08 NOTE — Telephone Encounter (Signed)
Pt is going out of town and request refill of med. Pt said she is going out of town and sometimes her fibromyalgia will flair up when traveling long distances, pt request call back

## 2013-02-11 ENCOUNTER — Encounter: Payer: Self-pay | Admitting: Family Medicine

## 2013-02-11 NOTE — Telephone Encounter (Signed)
Pt wanted to know if oxycodone could be called in to pharmacy. Advised pt she will need to pick up rx at our office.

## 2013-02-14 ENCOUNTER — Telehealth: Payer: Self-pay | Admitting: *Deleted

## 2013-02-14 NOTE — Telephone Encounter (Signed)
Form received from [facility] sheriff's office for Dr Dayton Martes to complete regarding pt's application for a concealed weapon permit.  Form completed, faxed back to dept at fax number [phone].  Form sent for scanning.

## 2013-02-22 ENCOUNTER — Encounter: Payer: Self-pay | Admitting: Family Medicine

## 2013-02-24 ENCOUNTER — Other Ambulatory Visit: Payer: Self-pay | Admitting: Family Medicine

## 2013-02-25 NOTE — Telephone Encounter (Signed)
[facility] 

## 2013-03-21 ENCOUNTER — Other Ambulatory Visit: Payer: Self-pay | Admitting: Family Medicine

## 2013-03-22 NOTE — Telephone Encounter (Signed)
plz phone in. 

## 2013-03-22 NOTE — Telephone Encounter (Signed)
[facility] 

## 2013-04-16 ENCOUNTER — Other Ambulatory Visit: Payer: Self-pay | Admitting: Family Medicine

## 2013-04-17 NOTE — Telephone Encounter (Signed)
[facility] 

## 2013-04-23 ENCOUNTER — Other Ambulatory Visit: Payer: Self-pay | Admitting: Family Medicine

## 2013-04-23 NOTE — Telephone Encounter (Signed)
[facility] 

## 2013-04-23 NOTE — Telephone Encounter (Signed)
Ok to refill 

## 2013-04-30 ENCOUNTER — Ambulatory Visit (INDEPENDENT_AMBULATORY_CARE_PROVIDER_SITE_OTHER): Payer: BC Managed Care – PPO | Admitting: Family Medicine

## 2013-04-30 ENCOUNTER — Encounter: Payer: Self-pay | Admitting: Family Medicine

## 2013-04-30 VITALS — BP 102/64 | HR 60 | Temp 98.0°F | Ht 64.0 in | Wt 176.0 lb

## 2013-04-30 DIAGNOSIS — R42 Dizziness and giddiness: Secondary | ICD-10-CM

## 2013-04-30 MED ORDER — MECLIZINE HCL 50 MG PO TABS: 50.0000 mg | ORAL_TABLET | Freq: Three times a day (TID) | ORAL | Status: DC | PRN

## 2013-04-30 NOTE — Progress Notes (Signed)
Subjective:    Patient ID: [name], female    DOB: Jan 07, 1971, 42 y.o.   MRN: [id number]  HPI  42 yo here for recurrent vertigo.  I initially saw her for these symptoms in 09/2012.  Given meclizine and referred to ENT for vestibular rehab given persistance of symptoms.  She cancelled appointment since symptoms resolved.    Since last week, she is having sensation of room spinning with changes of head position, nausea without vomiting.  Meclizine is helping.  Has had some mild allergy symptoms.  Taking Zyrtec as well.   Patient Active Problem List   Diagnosis Date Noted  . Acute sinusitis 01/18/2013  . Tick bite 01/18/2013  . Vertigo 09/21/2012  . Cervical pain (neck) 11/24/2011  . Fibromyalgia 04/07/2011  . Other and unspecified hyperlipidemia 05/05/2010  . GENITAL HERPES 01/22/2010  . INSOMNIA, CHRONIC 01/22/2010  . GERD 01/22/2010  . BULIMIA, HX OF 01/22/2010   Past Medical History  Diagnosis Date  . Fibromyalgia     Dr. Corliss Skains  . Genital herpes   . Bulimia nervosa     history  . Bulging disc     T7/8-receiving ESI's (Dr. Charlett Blake)   Past Surgical History  Procedure Laterality Date  . Cesarean section    . Abdominal hysterectomy      endometriosis  . Rotator cuff repair    . Parotidectomy  2009    benign tumor   History  Substance Use Topics  . Smoking status: Never Smoker   . Smokeless tobacco: Never Used  . Alcohol Use: Yes     Comment: occasional   Family History  Problem Relation Age of Onset  . Lung cancer Mother   . Stroke Father   . Breast cancer Maternal Aunt 54   Allergies  Allergen Reactions  . Codeine     REACTION: GI upset  . Doxycycline Nausea And Vomiting   Current Outpatient Prescriptions on File Prior to Visit  Medication Sig Dispense Refill  . buPROPion (WELLBUTRIN XL) 300 MG 24 hr tablet TAKE 1 TABLET BY MOUTH EVERY DAY  30 tablet  2  . clonazePAM (KLONOPIN) 0.5 MG tablet TAKE 1 TABLET BY MOUTH AT BEDTIME  30 tablet  0  .  cyclobenzaprine (FLEXERIL) 10 MG tablet TAKE 1 TABLET BY MOUTH AT BEDTIME AS NEEDED FOR MUSCLE SPASMS.  30 tablet  3  . lansoprazole (PREVACID) 30 MG capsule TAKE 1 CAPSULE BY MOUTH ONCE A DAY  30 capsule  5  . meclizine (ANTIVERT) 50 MG tablet Take 1 tablet (50 mg total) by mouth 3 (three) times daily as needed for dizziness or nausea.  30 tablet  0  . ondansetron (ZOFRAN) 4 MG tablet Take 1 tablet (4 mg total) by mouth every 8 (eight) hours as needed for nausea.  20 tablet  0  . oxyCODONE-acetaminophen (PERCOCET/ROXICET) 5-325 MG per tablet Take 1-2 tablets by mouth every 6 (six) hours as needed for pain.  30 tablet  0  . simvastatin (ZOCOR) 10 MG tablet TAKE 1 TABLET BY MOUTH AT BEDTIME  90 tablet  0  . valACYclovir (VALTREX) 500 MG tablet Take 1 tablet (500 mg total) by mouth 2 (two) times daily as needed.  60 tablet  1  . zolpidem (AMBIEN CR) 12.5 MG CR tablet TAKE 1 TABLET BY MOUTH AT BEDTIME  30 tablet  0   No current facility-administered medications on file prior to visit.   The PMH, PSH, Social History, Family History, Medications, and allergies  have been reviewed [facility], and have been updated if relevant.    Review of Systems See HPI    + HA No other focal neuro symptoms Objective:   Physical Exam BP 102/64  Pulse 60  Temp(Src) 98 F (36.7 C)  Ht 5\' 4"  (1.626 m)  Wt 176 lb (79.833 kg)  BMI 30.2 kg/m2  General:  Well-developed,well-nourished,in no acute distress; alert,appropriate and cooperative throughout examination Head:  normocephalic and atraumatic.   Eyes:  vision grossly intact, pupils equal, pupils round, and pupils reactive to light.   Pos slight nystagmus with changes in head position, + dizziness with changes in head position Ears:  R ear normal and L ear normal.   Nose:  no external deformity.   Mouth:  good dentition.   Lungs:  Normal respiratory effort, chest expands symmetrically. Lungs are clear to auscultation, no crackles or wheezes. Heart:  Normal  rate and regular rhythm. S1 and S2 normal without gallop, murmur, click, rub or other extra sounds. Abdomen:  Bowel sounds positive,abdomen soft and non-tender without masses, organomegaly or hernias noted. Msk:  No deformity or scoliosis noted of thoracic or lumbar spine.   Extremities:  No clubbing, cyanosis, edema, or deformity noted with normal full range of motion of all joints.   Neurologic:  alert & oriented X3 and gait normal.   Skin:  Intact without suspicious lesions or rashes Psych:  Cognition and judgment appear intact. Alert and cooperative with normal attention span and concentration. No apparent delusions, illusions, hallucinations     Assessment & Plan:   1. Vertigo Symptoms classic for BPV. Continue meclizine.  Refer back to ENT for vestibular rehab. The patient indicates understanding of these issues and agrees with the plan.  - Ambulatory referral to ENT

## 2013-04-30 NOTE — Patient Instructions (Addendum)
Good to see you. This seems very much like vertigo.  We will call you with an ENT referral.  Continue Meclizine, Zyrtec for now.

## 2013-05-10 ENCOUNTER — Ambulatory Visit (INDEPENDENT_AMBULATORY_CARE_PROVIDER_SITE_OTHER): Payer: BC Managed Care – PPO | Admitting: Family Medicine

## 2013-05-10 ENCOUNTER — Ambulatory Visit (INDEPENDENT_AMBULATORY_CARE_PROVIDER_SITE_OTHER)
Admission: RE | Admit: 2013-05-10 | Discharge: 2013-05-10 | Disposition: A | Discharge status: Home / Self Care | Payer: BC Managed Care – PPO | Source: Ambulatory Visit | Attending: Family Medicine | Admitting: Family Medicine

## 2013-05-10 ENCOUNTER — Encounter: Payer: Self-pay | Admitting: Family Medicine

## 2013-05-10 VITALS — BP 116/84 | HR 90 | Temp 98.8°F | Ht 64.0 in | Wt 175.8 lb

## 2013-05-10 DIAGNOSIS — S6980XA Other specified injuries of unspecified wrist, hand and finger(s), initial encounter: Secondary | ICD-10-CM

## 2013-05-10 DIAGNOSIS — S6991XA Unspecified injury of right wrist, hand and finger(s), initial encounter: Secondary | ICD-10-CM

## 2013-05-10 DIAGNOSIS — S6990XA Unspecified injury of unspecified wrist, hand and finger(s), initial encounter: Secondary | ICD-10-CM

## 2013-05-10 NOTE — Progress Notes (Signed)
Subjective:    Patient ID: [name], female    DOB: November 04, 1970, 42 y.o.   MRN: [id number]  HPI Here for R middle finger last night playing softball  She was diving into a base and hyperextended at that time  Pain was pretty bad and it felt "unnatural" Hurt to move her hand - but kept playing - without throwing   ? If injured finger in past in HS  It began swelling immediately  Is a little bruised  Not numb Can move it with discomfort - hurts to make a grip  She is R handed   Patient Active Problem List   Diagnosis Date Noted  . Acute sinusitis 01/18/2013  . Tick bite 01/18/2013  . Vertigo 09/21/2012  . Cervical pain (neck) 11/24/2011  . Fibromyalgia 04/07/2011  . Other and unspecified hyperlipidemia 05/05/2010  . GENITAL HERPES 01/22/2010  . INSOMNIA, CHRONIC 01/22/2010  . GERD 01/22/2010  . BULIMIA, HX OF 01/22/2010   Past Medical History  Diagnosis Date  . Fibromyalgia     Dr. Corliss Skains  . Genital herpes   . Bulimia nervosa     history  . Bulging disc     T7/8-receiving ESI's (Dr. Charlett Blake)   Past Surgical History  Procedure Laterality Date  . Cesarean section    . Abdominal hysterectomy      endometriosis  . Rotator cuff repair    . Parotidectomy  2009    benign tumor   History  Substance Use Topics  . Smoking status: Never Smoker   . Smokeless tobacco: Never Used  . Alcohol Use: Yes     Comment: occasional   Family History  Problem Relation Age of Onset  . Lung cancer Mother   . Stroke Father   . Breast cancer Maternal Aunt 54   Allergies  Allergen Reactions  . Codeine     REACTION: GI upset  . Doxycycline Nausea And Vomiting   Current Outpatient Prescriptions on File Prior to Visit  Medication Sig Dispense Refill  . buPROPion (WELLBUTRIN XL) 300 MG 24 hr tablet TAKE 1 TABLET BY MOUTH EVERY DAY  30 tablet  2  . clonazePAM (KLONOPIN) 0.5 MG tablet TAKE 1 TABLET BY MOUTH AT BEDTIME  30 tablet  0  . cyclobenzaprine (FLEXERIL) 10 MG tablet  TAKE 1 TABLET BY MOUTH AT BEDTIME AS NEEDED FOR MUSCLE SPASMS.  30 tablet  3  . lansoprazole (PREVACID) 30 MG capsule TAKE 1 CAPSULE BY MOUTH ONCE A DAY  30 capsule  5  . meclizine (ANTIVERT) 50 MG tablet Take 1 tablet (50 mg total) by mouth 3 (three) times daily as needed for dizziness or nausea.  30 tablet  0  . ondansetron (ZOFRAN) 4 MG tablet Take 1 tablet (4 mg total) by mouth every 8 (eight) hours as needed for nausea.  20 tablet  0  . oxyCODONE-acetaminophen (PERCOCET/ROXICET) 5-325 MG per tablet Take 1-2 tablets by mouth every 6 (six) hours as needed for pain.  30 tablet  0  . simvastatin (ZOCOR) 10 MG tablet TAKE 1 TABLET BY MOUTH AT BEDTIME  90 tablet  0  . valACYclovir (VALTREX) 500 MG tablet Take 1 tablet (500 mg total) by mouth 2 (two) times daily as needed.  60 tablet  1  . zolpidem (AMBIEN CR) 12.5 MG CR tablet TAKE 1 TABLET BY MOUTH AT BEDTIME  30 tablet  0   No current facility-administered medications on file prior to visit.      Review  of Systems Review of Systems  Constitutional: Negative for fever, appetite change, fatigue and unexpected weight change.  Eyes: Negative for pain and visual disturbance.  Respiratory: Negative for cough and shortness of breath.   Cardiovascular: Negative for cp or palpitations    Gastrointestinal: Negative for nausea, diarrhea and constipation.  Genitourinary: Negative for urgency and frequency.  Skin: Negative for pallor or rash   MSK pos for finger pain and swelling  Neurological: Negative for weakness, light-headedness, numbness and headaches.  Hematological: Negative for adenopathy. Does not bruise/bleed easily.  Psychiatric/Behavioral: Negative for dysphoric mood. The patient is not nervous/anxious.         Objective:   Physical Exam  Constitutional: She appears well-developed and well-nourished. No distress.  obese and well appearing   HENT:  Head: Normocephalic and atraumatic.  Musculoskeletal:       Right hand: She  exhibits decreased range of motion, tenderness, bony tenderness and swelling. She exhibits normal capillary refill and no deformity. Normal sensation noted. Normal strength noted.  R middle finger is swollen - at middle PIP joint with tenderness  Very slt ecchymosis  Cannot fully flex due to pain- otherwise full rom sens and perf intact   Neurological: She is alert. She has normal reflexes.  Skin: Skin is warm and dry. No erythema. No pallor.  Psychiatric: She has a normal mood and affect.          Assessment & Plan:

## 2013-05-10 NOTE — Patient Instructions (Addendum)
Avoid contact sports - softball for at least a week  Ice ice every chance you can for 10 minutes at a time We will call with radiology review when it returns  Aleve 1-2 pills twice daily with a meal Update [place] on Monday with how your finger is  Wear splint when active

## 2013-05-12 NOTE — Assessment & Plan Note (Signed)
Right middle finger -pain and swelling after hyperextension from trauma  Nl perf/ sens and motor  Xray-no acute fx-pend rad rev Splint placed Adv ice/ nsaid/rest over wkend and reassess if not imp

## 2013-05-13 ENCOUNTER — Telehealth: Payer: Self-pay

## 2013-05-13 NOTE — Telephone Encounter (Signed)
Pt left v/m; pt got results of no fx of hand; pt wanted to report hand injury is improving, symptoms are getting better, no concerns at this time.

## 2013-05-13 NOTE — Telephone Encounter (Signed)
That sounds good -thanks for updating me

## 2013-05-14 ENCOUNTER — Other Ambulatory Visit: Payer: Self-pay | Admitting: Family Medicine

## 2013-05-15 ENCOUNTER — Other Ambulatory Visit: Payer: Self-pay | Admitting: Family Medicine

## 2013-05-15 NOTE — Telephone Encounter (Signed)
Last office visit 04/30/2013.  Ok to refill? 

## 2013-05-22 ENCOUNTER — Other Ambulatory Visit: Payer: Self-pay | Admitting: Family Medicine

## 2013-05-25 ENCOUNTER — Other Ambulatory Visit: Payer: Self-pay | Admitting: Family Medicine

## 2013-05-26 NOTE — Telephone Encounter (Signed)
Last office visit 9/26/204 with Dr. Milinda Antis.  Ok to refill?

## 2013-05-27 NOTE — Telephone Encounter (Signed)
Called to [facility] 

## 2013-06-23 ENCOUNTER — Other Ambulatory Visit: Payer: Self-pay | Admitting: Family Medicine

## 2013-06-24 ENCOUNTER — Other Ambulatory Visit: Payer: Self-pay

## 2013-06-24 DIAGNOSIS — Z1231 Encounter for screening mammogram for malignant neoplasm of breast: Secondary | ICD-10-CM

## 2013-06-24 NOTE — Telephone Encounter (Signed)
Ok to phone in.

## 2013-06-24 NOTE — Telephone Encounter (Signed)
Rx called to pharmacy

## 2013-06-24 NOTE — Telephone Encounter (Signed)
Last office visit 05/10/13 (acute). Is it okay to refill?

## 2013-07-18 ENCOUNTER — Other Ambulatory Visit: Payer: Self-pay | Admitting: Family Medicine

## 2013-07-18 NOTE — Telephone Encounter (Signed)
This is early, not to be filled until 07/22/13. Please call in.

## 2013-07-18 NOTE — Telephone Encounter (Signed)
Electronic refill request.  Please advise. 

## 2013-07-19 NOTE — Telephone Encounter (Signed)
Medication phoned to pharmacy with instructions not to fill until 07/22/13.

## 2013-07-23 ENCOUNTER — Other Ambulatory Visit: Payer: Self-pay

## 2013-07-23 MED ORDER — OXYCODONE-ACETAMINOPHEN 5-325 MG PO TABS: 1.0000 | ORAL_TABLET | Freq: Four times a day (QID) | ORAL | Status: DC | PRN

## 2013-07-23 NOTE — Telephone Encounter (Signed)
Pt left v/m requesting rx oxycodone apap. Call when ready for pick up. 

## 2013-07-23 NOTE — Telephone Encounter (Signed)
Left message on machine that rx is ready for pick-up, and it will be at our front desk.  

## 2013-07-26 ENCOUNTER — Ambulatory Visit
Admission: RE | Admit: 2013-07-26 | Discharge: 2013-07-26 | Disposition: A | Discharge status: Home / Self Care | Payer: BC Managed Care – PPO | Source: Ambulatory Visit

## 2013-07-26 DIAGNOSIS — Z1231 Encounter for screening mammogram for malignant neoplasm of breast: Secondary | ICD-10-CM

## 2013-07-28 ENCOUNTER — Other Ambulatory Visit: Payer: Self-pay | Admitting: Family Medicine

## 2013-07-28 NOTE — Telephone Encounter (Signed)
Last office visit 05/10/2013 with Dr. Milinda Antis.  Ok to refill?

## 2013-07-29 NOTE — Telephone Encounter (Signed)
Called to [facility] [place].

## 2013-07-29 NOTE — Telephone Encounter (Signed)
Ok to phone in ambien 

## 2013-08-16 ENCOUNTER — Other Ambulatory Visit: Payer: Self-pay | Admitting: Family Medicine

## 2013-08-19 ENCOUNTER — Other Ambulatory Visit: Payer: Self-pay | Admitting: *Deleted

## 2013-08-19 MED ORDER — CLONAZEPAM 0.5 MG PO TABS: ORAL_TABLET | ORAL | Status: DC

## 2013-08-19 NOTE — Telephone Encounter (Signed)
Ok to refill 

## 2013-08-19 NOTE — Telephone Encounter (Signed)
Rx called in as directed.   

## 2013-08-20 ENCOUNTER — Ambulatory Visit (INDEPENDENT_AMBULATORY_CARE_PROVIDER_SITE_OTHER): Payer: BC Managed Care – PPO | Admitting: Internal Medicine

## 2013-08-20 ENCOUNTER — Encounter: Payer: Self-pay | Admitting: Internal Medicine

## 2013-08-20 ENCOUNTER — Ambulatory Visit (INDEPENDENT_AMBULATORY_CARE_PROVIDER_SITE_OTHER)
Admission: RE | Admit: 2013-08-20 | Discharge: 2013-08-20 | Disposition: A | Discharge status: Home / Self Care | Payer: BC Managed Care – PPO | Source: Ambulatory Visit | Attending: Internal Medicine | Admitting: Internal Medicine

## 2013-08-20 VITALS — BP 118/70 | HR 78 | Temp 98.0°F | Wt 177.0 lb

## 2013-08-20 DIAGNOSIS — R0609 Other forms of dyspnea: Secondary | ICD-10-CM

## 2013-08-20 DIAGNOSIS — R0989 Other specified symptoms and signs involving the circulatory and respiratory systems: Secondary | ICD-10-CM

## 2013-08-20 DIAGNOSIS — R0689 Other abnormalities of breathing: Secondary | ICD-10-CM

## 2013-08-20 DIAGNOSIS — R0602 Shortness of breath: Secondary | ICD-10-CM | POA: Insufficient documentation

## 2013-08-20 MED ORDER — ALBUTEROL SULFATE HFA 108 (90 BASE) MCG/ACT IN AERS: 2.0000 | INHALATION_SPRAY | Freq: Four times a day (QID) | RESPIRATORY_TRACT | Status: DC | PRN

## 2013-08-20 NOTE — Patient Instructions (Signed)
Please put away that new blanket for now. Try the inhaler---make sure you get a spacer from the pharmacist to use with the inhaler.

## 2013-08-20 NOTE — Progress Notes (Signed)
Pre-visit discussion using our clinic review tool. No additional management support is needed unless otherwise documented below in the visit note.  

## 2013-08-20 NOTE — Progress Notes (Signed)
Subjective:    Patient ID: [name], female    DOB: July 31, 1971, 43 y.o.   MRN: [id number]  HPI For past 4 days, lungs feel constricted Aware of breathing--hard to take deep breath Some pain associated with this---in back  Notices it more at night and has some cough Generally doesn't cough in day Has been able to work out without a problem  Slight lightheaded feeling once--when got up fast No asthma in past No wheezing No real chest pain  Not sick--no fever No congestion or cold symptoms  Current Outpatient Prescriptions on File Prior to Visit  Medication Sig Dispense Refill  . buPROPion (WELLBUTRIN XL) 300 MG 24 hr tablet TAKE 1 TABLET BY MOUTH EVERY DAY  30 tablet  2  . clonazePAM (KLONOPIN) 0.5 MG tablet TAKE 1 TABLET BY MOUTH AT BEDTIME  30 tablet  0  . cyclobenzaprine (FLEXERIL) 10 MG tablet TAKE 1 TABLET BY MOUTH AT BEDTIME AS NEEDED FOR MUSCLE SPASMS.  30 tablet  3  . lansoprazole (PREVACID) 30 MG capsule TAKE 1 CAPSULE BY MOUTH ONCE A DAY  30 capsule  5  . meclizine (ANTIVERT) 50 MG tablet Take 1 tablet (50 mg total) by mouth 3 (three) times daily as needed for dizziness or nausea.  30 tablet  0  . ondansetron (ZOFRAN) 4 MG tablet Take 1 tablet (4 mg total) by mouth every 8 (eight) hours as needed for nausea.  20 tablet  0  . oxyCODONE-acetaminophen (PERCOCET/ROXICET) 5-325 MG per tablet Take 1-2 tablets by mouth every 6 (six) hours as needed.  30 tablet  0  . simvastatin (ZOCOR) 10 MG tablet TAKE 1 TABLET BY MOUTH AT BEDTIME  90 tablet  0  . valACYclovir (VALTREX) 500 MG tablet Take 1 tablet (500 mg total) by mouth 2 (two) times daily as needed.  60 tablet  1  . zolpidem (AMBIEN CR) 12.5 MG CR tablet TAKE 1 TABLET BY MOUTH AT BEDTIME AS NEEDED  30 tablet  0   No current facility-administered medications on file prior to visit.    Allergies  Allergen Reactions  . Codeine     REACTION: GI upset  . Doxycycline Nausea And Vomiting    Past Medical History    Diagnosis Date  . Fibromyalgia     Dr. Corliss Skainseveshwar  . Genital herpes   . Bulimia nervosa     history  . Bulging disc     T7/8-receiving ESI's (Dr. Charlett BlakeVoytek)    Past Surgical History  Procedure Laterality Date  . Cesarean section    . Abdominal hysterectomy      endometriosis  . Rotator cuff repair    . Parotidectomy  2009    benign tumor    Family History  Problem Relation Age of Onset  . Lung cancer Mother   . Stroke Father   . Breast cancer Maternal Aunt [phone]    History   Social History  . Marital Status: Divorced    Spouse Name: N/A    Number of Children: 1  . Years of Education: N/A   Occupational History  . IT Emergency planning/management officerproject manager    Social History Main Topics  . Smoking status: Never Smoker   . Smokeless tobacco: Never Used  . Alcohol Use: Yes     Comment: occasional  . Drug Use: No  . Sexual Activity: Not on file   Other Topics Concern  . Not on file   Social History Narrative   Regular exercise-yes  Plays softball regularly      Has 1 daughter       L[facility] in [facility]      IT Emergency planning/management officer for [employer]            Review of Systems No leg swelling No vomiting or diarrhea No change in appetite  Weight is stable No OCP--had hysterectomy Did get a new blanket for Christmas--furry throw    Objective:   Physical Exam  Constitutional: She appears well-developed and well-nourished. No distress.  Neck: Normal range of motion. Neck supple. No thyromegaly present.  Cardiovascular: Normal rate, regular rhythm and normal heart sounds.  Exam reveals no gallop.   No murmur heard. Pulmonary/Chest: Effort normal and breath sounds normal. No respiratory distress. She has no wheezes. She has no rales. She exhibits no tenderness.  No dullness  Musculoskeletal:  No calf swelling or tenderness  Lymphadenopathy:    She has no cervical adenopathy.          Assessment & Plan:

## 2013-08-20 NOTE — Assessment & Plan Note (Signed)
Doesn't sound like infection CXR looks okay (slight retrocardiac density---will await overread). Will give antibiotic if something there Spirometry looks normal-- still sounds like bronchospasm thing  Will have her put away that new blanket Try albuterol HFA

## 2013-08-23 ENCOUNTER — Other Ambulatory Visit: Payer: Self-pay | Admitting: Family Medicine

## 2013-08-23 ENCOUNTER — Other Ambulatory Visit: Payer: Self-pay | Admitting: *Deleted

## 2013-08-23 NOTE — Telephone Encounter (Signed)
Medication phoned to pharmacy.  

## 2013-08-23 NOTE — Telephone Encounter (Signed)
Electronic refill request.  Please advise. 

## 2013-08-23 NOTE — Telephone Encounter (Signed)
Medication called into pharmacy per Suezanne JacquetLugene F, CMA

## 2013-08-28 ENCOUNTER — Other Ambulatory Visit: Payer: Self-pay | Admitting: Family Medicine

## 2013-08-28 NOTE — Telephone Encounter (Signed)
Lm on pts vm requesting a call back. Ov is needed for additional refills

## 2013-08-29 ENCOUNTER — Other Ambulatory Visit: Payer: Self-pay | Admitting: *Deleted

## 2013-08-31 ENCOUNTER — Other Ambulatory Visit: Payer: Self-pay | Admitting: Family Medicine

## 2013-09-06 ENCOUNTER — Other Ambulatory Visit: Payer: Self-pay | Admitting: Family Medicine

## 2013-09-06 MED ORDER — ZOLPIDEM TARTRATE ER 12.5 MG PO TBCR: EXTENDED_RELEASE_TABLET | ORAL | Status: DC

## 2013-09-06 NOTE — Addendum Note (Signed)
Addended by: Patience MuscaISLEY, RENA M on: 09/06/2013 03:02 PM   Modules accepted: Orders

## 2013-09-06 NOTE — Telephone Encounter (Signed)
Pt did not get ambien on 01/09/*15; [facility] and she could not find call in on 08/23/13. [facility] instructed. Apologized to pt; not sure [facility] but advised pt was called in today. Pt appreciative.

## 2013-09-17 ENCOUNTER — Other Ambulatory Visit: Payer: Self-pay | Admitting: Family Medicine

## 2013-09-17 NOTE — Telephone Encounter (Signed)
Spoke to pt and informed her Rx has been faxed to requested pharmacy 

## 2013-09-17 NOTE — Telephone Encounter (Signed)
Pt requesting medication refill. Last ov 04/2013 with no future appts scheduled. pls advise. 

## 2013-10-02 ENCOUNTER — Other Ambulatory Visit: Payer: Self-pay | Admitting: Family Medicine

## 2013-10-02 NOTE — Telephone Encounter (Signed)
Spoken to pt and informed her Rx has been called in to requested pharmacy

## 2013-10-02 NOTE — Telephone Encounter (Signed)
Pt requesting medication refill. Last ov 04/2013 with no future appts scheduled. pls advise. 

## 2013-10-15 ENCOUNTER — Other Ambulatory Visit: Payer: Self-pay | Admitting: Family Medicine

## 2013-10-17 NOTE — Telephone Encounter (Signed)
Pt requesting medication refill. Last ov 04/2013 with no future appts scheduled. Hx of HSV

## 2013-10-18 ENCOUNTER — Other Ambulatory Visit: Payer: Self-pay | Admitting: Family Medicine

## 2013-10-22 NOTE — Telephone Encounter (Signed)
Lm on pts vm informing her Rx has been called in to requested pharmacy 

## 2013-10-28 ENCOUNTER — Other Ambulatory Visit: Payer: Self-pay

## 2013-10-28 MED ORDER — OXYCODONE-ACETAMINOPHEN 5-325 MG PO TABS: 1.0000 | ORAL_TABLET | Freq: Four times a day (QID) | ORAL | Status: DC | PRN

## 2013-10-28 NOTE — Telephone Encounter (Signed)
Ok to print out and put in my box for signature. 

## 2013-10-28 NOTE — Telephone Encounter (Signed)
Pt left v/m requesting rx oxycodone apap. Call when ready for pick up. 

## 2013-10-29 NOTE — Telephone Encounter (Signed)
Spoke to pt and informed her Rx is available for pickup at the front desk; informed a gov't issued photo id required for pickup 

## 2013-11-01 ENCOUNTER — Other Ambulatory Visit: Payer: Self-pay | Admitting: Family Medicine

## 2013-11-01 NOTE — Telephone Encounter (Signed)
Lm on pts vm informing her Rx has been called in to requested pharmacy; pt informed OV required for additional refills

## 2013-11-01 NOTE — Telephone Encounter (Signed)
Pt requesting medication refill. Last ov 04/2013 with no future appts scheduled. pls advise. 

## 2013-11-07 ENCOUNTER — Ambulatory Visit (INDEPENDENT_AMBULATORY_CARE_PROVIDER_SITE_OTHER): Payer: BC Managed Care – PPO | Admitting: Internal Medicine

## 2013-11-07 ENCOUNTER — Encounter: Payer: Self-pay | Admitting: Internal Medicine

## 2013-11-07 ENCOUNTER — Ambulatory Visit (INDEPENDENT_AMBULATORY_CARE_PROVIDER_SITE_OTHER)
Admission: RE | Admit: 2013-11-07 | Discharge: 2013-11-07 | Disposition: A | Discharge status: Home / Self Care | Payer: BC Managed Care – PPO | Source: Ambulatory Visit | Attending: Internal Medicine | Admitting: Internal Medicine

## 2013-11-07 VITALS — BP 110/70 | Temp 98.4°F | Wt 176.0 lb

## 2013-11-07 DIAGNOSIS — R300 Dysuria: Secondary | ICD-10-CM

## 2013-11-07 DIAGNOSIS — R109 Unspecified abdominal pain: Secondary | ICD-10-CM

## 2013-11-07 DIAGNOSIS — R1031 Right lower quadrant pain: Secondary | ICD-10-CM

## 2013-11-07 LAB — POCT URINALYSIS DIPSTICK
BILIRUBIN UA: NEGATIVE
GLUCOSE UA: NEGATIVE
KETONES UA: NEGATIVE
LEUKOCYTES UA: NEGATIVE
Nitrite, UA: NEGATIVE
Protein, UA: NEGATIVE
Spec Grav, UA: 1.01
Urobilinogen, UA: NEGATIVE
pH, UA: 6

## 2013-11-07 NOTE — Progress Notes (Signed)
Pre visit review using our clinic review tool, if applicable. No additional management support is needed unless otherwise documented below in the visit note. 

## 2013-11-07 NOTE — Patient Instructions (Signed)
Kidney Stones  Kidney stones (urolithiasis) are deposits that form inside your kidneys. The intense pain is caused by the stone moving through the urinary tract. When the stone moves, the ureter goes into spasm around the stone. The stone is usually passed in the urine.   CAUSES   · A disorder that makes certain neck glands produce too much parathyroid hormone (primary hyperparathyroidism).  · A buildup of uric acid crystals, similar to gout in your joints.  · Narrowing (stricture) of the ureter.  · A kidney obstruction present at birth (congenital obstruction).  · Previous surgery on the kidney or ureters.  · Numerous kidney infections.  SYMPTOMS   · Feeling sick to your stomach (nauseous).  · Throwing up (vomiting).  · Blood in the urine (hematuria).  · Pain that usually spreads (radiates) to the groin.  · Frequency or urgency of urination.  DIAGNOSIS   · Taking a history and physical exam.  · Blood or urine tests.  · CT scan.  · Occasionally, an examination of the inside of the urinary bladder (cystoscopy) is performed.  TREATMENT   · Observation.  · Increasing your fluid intake.  · Extracorporeal shock wave lithotripsy This is a noninvasive procedure that uses shock waves to break up kidney stones.  · Surgery may be needed if you have severe pain or persistent obstruction. There are various surgical procedures. Most of the procedures are performed with the use of small instruments. Only small incisions are needed to accommodate these instruments, so recovery time is minimized.  The size, location, and chemical composition are all important variables that will determine the proper choice of action for you. Talk to your health care provider to better understand your situation so that you will minimize the risk of injury to yourself and your kidney.   HOME CARE INSTRUCTIONS   · Drink enough water and fluids to keep your urine clear or pale yellow. This will help you to pass the stone or stone fragments.  · Strain  all urine through the provided strainer. Keep all particulate matter and stones for your health care provider to see. The stone causing the pain may be as small as a grain of salt. It is very important to use the strainer each and every time you pass your urine. The collection of your stone will allow your health care provider to analyze it and verify that a stone has actually passed. The stone analysis will often identify what you can do to reduce the incidence of recurrences.  · Only take over-the-counter or prescription medicines for pain, discomfort, or fever as directed by your health care provider.  · Make a follow-up appointment with your health care provider as directed.  · Get follow-up X-rays if required. The absence of pain does not always mean that the stone has passed. It may have only stopped moving. If the urine remains completely obstructed, it can cause loss of kidney function or even complete destruction of the kidney. It is your responsibility to make sure X-rays and follow-ups are completed. Ultrasounds of the kidney can show blockages and the status of the kidney. Ultrasounds are not associated with any radiation and can be performed easily in a matter of minutes.  SEEK MEDICAL CARE IF:  · You experience pain that is progressive and unresponsive to any pain medicine you have been prescribed.  SEEK IMMEDIATE MEDICAL CARE IF:   · Pain cannot be controlled with the prescribed medicine.  · You have a fever   or shaking chills.  · The severity or intensity of pain increases over 18 hours and is not relieved by pain medicine.  · You develop a new onset of abdominal pain.  · You feel faint or pass out.  · You are unable to urinate.  MAKE SURE YOU:   · Understand these instructions.  · Will watch your condition.  · Will get help right away if you are not doing well or get worse.  Document Released: 08/01/2005 Document Revised: 04/03/2013 Document Reviewed: 01/02/2013  [employer]® Patient Information ©2014  [employer].

## 2013-11-07 NOTE — Progress Notes (Signed)
HPI  Pt presents to the clinic today with c/o RLQ abdominal pain and flank pain on the right side. She reports this started 2 days ago. She does c/o difficulty starting her stream but denies urgency, frequency or dysuria. She denies fever, chills, nausea or vomiting. She has not tried anything OTC. She has tried to increase her water intake. She also drinks a lot of energy drinks.    Review of Systems  Past Medical History  Diagnosis Date  . Fibromyalgia     Dr. Corliss Skainseveshwar  . Genital herpes   . Bulimia nervosa     history  . Bulging disc     T7/8-receiving ESI's (Dr. Charlett BlakeVoytek)    Family History  Problem Relation Age of Onset  . Lung cancer Mother   . Stroke Father   . Breast cancer Maternal Aunt [phone]    History   Social History  . Marital Status: Divorced    Spouse Name: N/A    Number of Children: 1  . Years of Education: N/A   Occupational History  . IT Emergency planning/management officerproject manager    Social History Main Topics  . Smoking status: Never Smoker   . Smokeless tobacco: Never Used  . Alcohol Use: Yes     Comment: occasional  . Drug Use: No  . Sexual Activity: Not on file   Other Topics Concern  . Not on file   Social History Narrative   Regular exercise-yes      Plays softball regularly      Has 1 daughter       [place]      IT Emergency planning/management officerproject manager for [employer]             Allergies  Allergen Reactions  . Codeine     REACTION: GI upset  . Doxycycline Nausea And Vomiting    Constitutional: Denies fever, malaise, fatigue, headache or abrupt weight changes.   GU: Pt reports difficulty with urination. Denies urgency, frequency , dysuria or burning sensation, blood in urine, odor or discharge. Skin: Denies redness, rashes, lesions or ulcercations.   No other specific complaints in a complete review of systems (except as listed in HPI above).    Objective:   Physical Exam  BP 110/70  Temp(Src) 98.4 F (36.9 C)  Wt 176 lb (79.833 kg) Wt Readings from  Last 3 Encounters:  11/07/13 176 lb (79.833 kg)  08/20/13 177 lb (80.287 kg)  05/10/13 175 lb 12.8 oz (79.742 kg)    General: Appears her stated age, well developed, well nourished in NAD. Cardiovascular: Normal rate and rhythm. S1,S2 noted.  No murmur, rubs or gallops noted. No JVD or BLE edema. No carotid bruits noted. Pulmonary/Chest: Normal effort and positive vesicular breath sounds. No respiratory distress. No wheezes, rales or ronchi noted.  Abdomen: Soft and nontender. Normal bowel sounds, no bruits noted. No distention or masses noted. Liver, spleen and kidneys non palpable. Tender to palpation over the bladder area.  CVA tenderness noted on the right.      Assessment & Plan:   RLQ pain and right flank pain, concerning for kidney stone  Urinalysis: mod blood Drink plenty of fluids Will check CT scan of abdomen to r/o kidney stones  RTC as needed or if symptoms persist.

## 2013-11-08 ENCOUNTER — Encounter: Payer: Self-pay | Admitting: Internal Medicine

## 2013-11-18 ENCOUNTER — Other Ambulatory Visit: Payer: Self-pay | Admitting: Family Medicine

## 2013-11-19 NOTE — Telephone Encounter (Signed)
Spoke to pt and informed her Rx has been faxed to requested pharmacy 

## 2013-11-19 NOTE — Telephone Encounter (Signed)
Pt requesting medication refill. Last ov 04/2013 with no future appts scheduled. pls advise. 

## 2013-11-21 ENCOUNTER — Encounter: Payer: Self-pay | Admitting: Family Medicine

## 2013-11-23 ENCOUNTER — Other Ambulatory Visit: Payer: Self-pay | Admitting: Family Medicine

## 2013-11-25 NOTE — Telephone Encounter (Signed)
Pt requesting medication refill. Last ov 04/2013 with no future appts scheduled. pls advise. 

## 2013-12-06 ENCOUNTER — Ambulatory Visit (INDEPENDENT_AMBULATORY_CARE_PROVIDER_SITE_OTHER): Payer: BC Managed Care – PPO | Admitting: Family Medicine

## 2013-12-06 ENCOUNTER — Encounter: Payer: Self-pay | Admitting: Family Medicine

## 2013-12-06 VITALS — BP 110/70 | HR 88 | Temp 98.2°F | Wt 171.8 lb

## 2013-12-06 DIAGNOSIS — Z23 Encounter for immunization: Secondary | ICD-10-CM

## 2013-12-06 DIAGNOSIS — G4700 Insomnia, unspecified: Secondary | ICD-10-CM

## 2013-12-06 MED ORDER — ZOLPIDEM TARTRATE ER 12.5 MG PO TBCR: EXTENDED_RELEASE_TABLET | ORAL | Status: DC

## 2013-12-06 NOTE — Progress Notes (Signed)
Subjective:   Patient ID: [name], female    DOB: 1970/08/20, 43 y.o.   MRN: [id number]  [name] is a pleasant 43 y.o. year old female who presents to clinic today with Medication Refill  on 12/06/2013  HPI: Insomnia- Has been on ambien for years.  Has failed every other OTC tx. She knows her body is dependent on it but cannot function without sleep.  She has not been abusing our controlled substances contract.  Difficulty falling and staying asleep without ambien.  Patient Active Problem List   Diagnosis Date Noted  . Breathing difficulty 08/20/2013  . Finger injury 05/10/2013  . Acute sinusitis 01/18/2013  . Tick bite 01/18/2013  . Vertigo 09/21/2012  . Cervical pain (neck) 11/24/2011  . Fibromyalgia 04/07/2011  . Other and unspecified hyperlipidemia 05/05/2010  . GENITAL HERPES 01/22/2010  . INSOMNIA, CHRONIC 01/22/2010  . GERD 01/22/2010  . BULIMIA, HX OF 01/22/2010   Past Medical History  Diagnosis Date  . Fibromyalgia     Dr. Corliss Skainseveshwar  . Genital herpes   . Bulimia nervosa     history  . Bulging disc     T7/8-receiving ESI's (Dr. Charlett BlakeVoytek)   Past Surgical History  Procedure Laterality Date  . Cesarean section    . Abdominal hysterectomy      endometriosis  . Rotator cuff repair    . Parotidectomy  2009    benign tumor   History  Substance Use Topics  . Smoking status: Never Smoker   . Smokeless tobacco: Never Used  . Alcohol Use: Yes     Comment: occasional   Family History  Problem Relation Age of Onset  . Lung cancer Mother   . Stroke Father   . Breast cancer Maternal Aunt 54   Allergies  Allergen Reactions  . Codeine     REACTION: GI upset  . Doxycycline Nausea And Vomiting   Current Outpatient Prescriptions on File Prior to Visit  Medication Sig Dispense Refill  . albuterol (PROVENTIL HFA;VENTOLIN HFA) 108 (90 BASE) MCG/ACT inhaler Inhale 2 puffs into the lungs every 6 (six) hours as needed for wheezing or shortness of  breath.  1 Inhaler  1  . buPROPion (WELLBUTRIN XL) 300 MG 24 hr tablet TAKE 1 TABLET BY MOUTH EVERY DAY  30 tablet  2  . clonazePAM (KLONOPIN) 0.5 MG tablet TAKE 1 TABLET BY MOUTH AT BEDTIME AS NEEDED  30 tablet  0  . cyclobenzaprine (FLEXERIL) 10 MG tablet TAKE 1 TABLET BY MOUTH AT BEDTIME AS NEEDED FOR MUSCLE SPASMS.  30 tablet  0  . lansoprazole (PREVACID) 30 MG capsule TAKE 1 CAPSULE BY MOUTH ONCE A DAY  30 capsule  5  . meclizine (ANTIVERT) 50 MG tablet Take 1 tablet (50 mg total) by mouth 3 (three) times daily as needed for dizziness or nausea.  30 tablet  0  . simvastatin (ZOCOR) 10 MG tablet TAKE 1 TABLET BY MOUTH AT BEDTIME  90 tablet  1  . valACYclovir (VALTREX) 500 MG tablet TAKE 1 TABLET (500 MG TOTAL) BY MOUTH 2 (TWO) TIMES DAILY AS NEEDED.  60 tablet  0   No current facility-administered medications on file prior to visit.   The PMH, PSH, Social History, Family History, Medications, and allergies have been reviewed in [facility], and have been updated if relevant.    Review of Systems    See HPI Objective:    BP 110/70  Pulse 88  Temp(Src) 98.2 F (36.8 C) (  Oral)  Wt 171 lb 12 oz (77.905 kg)   Physical Exam  Nursing note and vitals reviewed. Constitutional: She appears well-developed and well-nourished. No distress.  Skin: Skin is warm and dry.  Psychiatric: She has a normal mood and affect. Her behavior is normal. Judgment and thought content normal.          Assessment & Plan:   INSOMNIA, CHRONIC No Follow-up on file.

## 2013-12-06 NOTE — Progress Notes (Signed)
Pre visit review using our clinic review tool, if applicable. No additional management support is needed unless otherwise documented below in the visit note. 

## 2013-12-06 NOTE — Assessment & Plan Note (Signed)
>  15 minutes spent in face to face time with patient, >50% spent in counselling or coordination of care. Well controlled on current dose of ambien. Rx refilled.

## 2013-12-16 ENCOUNTER — Other Ambulatory Visit: Payer: Self-pay | Admitting: Family Medicine

## 2013-12-16 NOTE — Telephone Encounter (Signed)
Faxed to [facility].

## 2013-12-16 NOTE — Telephone Encounter (Signed)
Last office visit 12/06/13 for insomnia.  Ok to refill?

## 2013-12-29 ENCOUNTER — Other Ambulatory Visit: Payer: Self-pay | Admitting: Family Medicine

## 2013-12-30 NOTE — Telephone Encounter (Signed)
Pt requesting medication refill. Last ov 11/2013 with no future appts sched. pls advise

## 2013-12-30 NOTE — Telephone Encounter (Signed)
Lm on pts vm informing her Rx has been called in to requested pharmacy 

## 2014-01-11 ENCOUNTER — Other Ambulatory Visit: Payer: Self-pay | Admitting: Family Medicine

## 2014-01-12 NOTE — Telephone Encounter (Signed)
Last office visit 12/06/2013.  Last refilled 12/16/2013 for #30.  Ok to refill?

## 2014-01-15 ENCOUNTER — Ambulatory Visit (INDEPENDENT_AMBULATORY_CARE_PROVIDER_SITE_OTHER): Payer: BC Managed Care – PPO | Admitting: Psychiatry

## 2014-01-15 DIAGNOSIS — F4322 Adjustment disorder with anxiety: Secondary | ICD-10-CM

## 2014-01-18 ENCOUNTER — Other Ambulatory Visit: Payer: Self-pay | Admitting: Family Medicine

## 2014-01-20 NOTE — Telephone Encounter (Signed)
Lm on pts vm informing her Rx has been called in to requested pharmacy 

## 2014-01-20 NOTE — Telephone Encounter (Signed)
Pt requesting medication refill. Last f/u appt 11/2013 with no future appts scheduled. pls advise 

## 2014-01-31 ENCOUNTER — Other Ambulatory Visit: Payer: Self-pay | Admitting: Family Medicine

## 2014-01-31 MED ORDER — OXYCODONE-ACETAMINOPHEN 5-325 MG PO TABS: 1.0000 | ORAL_TABLET | Freq: Every day | ORAL | Status: DC | PRN

## 2014-01-31 NOTE — Addendum Note (Signed)
Addended by: Sueanne MargaritaSMITH, Fanchon Papania L on: 01/31/2014 02:34 PM   Modules accepted: Medications

## 2014-01-31 NOTE — Telephone Encounter (Signed)
rx called into pharmacy Spoke with patient and advised rx ready for pick-up and it will be at the front desk.  

## 2014-01-31 NOTE — Telephone Encounter (Signed)
Pt left v/m requesting rx oxycodone apap. Call when ready for pick up. 

## 2014-02-14 ENCOUNTER — Other Ambulatory Visit: Payer: Self-pay | Admitting: Family Medicine

## 2014-02-17 ENCOUNTER — Other Ambulatory Visit: Payer: Self-pay | Admitting: Family Medicine

## 2014-02-17 NOTE — Telephone Encounter (Signed)
Spoke to pt and informed her Rx has been faxed to requested pharmacy 

## 2014-02-17 NOTE — Telephone Encounter (Signed)
Klonopin last filled 01/20/14--Flexeril last filled 01/13/14--last OV was 12/06/13--please advise

## 2014-02-17 NOTE — Telephone Encounter (Signed)
Last filled 11/25/13 with 2 refills--last OV was 12/06/13--please advise

## 2014-02-28 ENCOUNTER — Other Ambulatory Visit: Payer: Self-pay | Admitting: Family Medicine

## 2014-02-28 NOTE — Telephone Encounter (Signed)
Lm on pts vm informing her Rx has been faxed to requested pharmacy.  

## 2014-02-28 NOTE — Telephone Encounter (Signed)
Last office visit 12/06/2013.  Last refilled 01/31/2014 for #30.  Ok to refill?

## 2014-03-03 ENCOUNTER — Other Ambulatory Visit: Payer: Self-pay | Admitting: Family Medicine

## 2014-03-13 ENCOUNTER — Other Ambulatory Visit: Payer: Self-pay | Admitting: Family Medicine

## 2014-03-17 NOTE — Telephone Encounter (Signed)
Pt requesting medication refill. Last f/u appt 11/2013 with no future appts scheduled. pls advise 

## 2014-03-17 NOTE — Telephone Encounter (Signed)
Rx faxed to requested pharmacy 

## 2014-03-28 ENCOUNTER — Other Ambulatory Visit: Payer: Self-pay | Admitting: Family Medicine

## 2014-03-28 NOTE — Telephone Encounter (Signed)
Pt requesting medication refill. Last f/u appt 11/2013. pls advise 

## 2014-03-28 NOTE — Telephone Encounter (Signed)
Rx called in to requested pharmacy 

## 2014-04-02 ENCOUNTER — Other Ambulatory Visit: Payer: Self-pay | Admitting: Family Medicine

## 2014-04-08 ENCOUNTER — Other Ambulatory Visit (INDEPENDENT_AMBULATORY_CARE_PROVIDER_SITE_OTHER): Payer: BC Managed Care – PPO

## 2014-04-08 ENCOUNTER — Other Ambulatory Visit: Payer: Self-pay | Admitting: Family Medicine

## 2014-04-08 DIAGNOSIS — Z0000 Encounter for general adult medical examination without abnormal findings: Secondary | ICD-10-CM

## 2014-04-08 DIAGNOSIS — E785 Hyperlipidemia, unspecified: Secondary | ICD-10-CM

## 2014-04-08 LAB — CBC WITH DIFFERENTIAL/PLATELET
Basophils Absolute: 0 10*3/uL (ref 0.0–0.1)
Basophils Relative: 0.3 % (ref 0.0–3.0)
EOS PCT: 2.2 % (ref 0.0–5.0)
Eosinophils Absolute: 0.2 10*3/uL (ref 0.0–0.7)
HCT: 38.3 % (ref 36.0–46.0)
Hemoglobin: 13 g/dL (ref 12.0–15.0)
Lymphocytes Relative: 31.8 % (ref 12.0–46.0)
Lymphs Abs: 2.6 10*3/uL (ref 0.7–4.0)
MCHC: 34 g/dL (ref 30.0–36.0)
MCV: 94 fl (ref 78.0–100.0)
Monocytes Absolute: 0.5 10*3/uL (ref 0.1–1.0)
Monocytes Relative: 6.5 % (ref 3.0–12.0)
Neutro Abs: 4.8 10*3/uL (ref 1.4–7.7)
Neutrophils Relative %: 59.2 % (ref 43.0–77.0)
Platelets: 363 10*3/uL (ref 150.0–400.0)
RBC: 4.07 Mil/uL (ref 3.87–5.11)
RDW: 12.4 % (ref 11.5–15.5)
WBC: 8.1 10*3/uL (ref 4.0–10.5)

## 2014-04-08 LAB — COMPREHENSIVE METABOLIC PANEL
ALBUMIN: 3.6 g/dL (ref 3.5–5.2)
ALT: 17 U/L (ref 0–35)
AST: 20 U/L (ref 0–37)
Alkaline Phosphatase: 63 U/L (ref 39–117)
BUN: 12 mg/dL (ref 6–23)
CO2: 28 mEq/L (ref 19–32)
Calcium: 9.1 mg/dL (ref 8.4–10.5)
Chloride: 104 mEq/L (ref 96–112)
Creatinine, Ser: 0.9 mg/dL (ref 0.4–1.2)
GFR: 69.98 mL/min (ref >60.00)
GLUCOSE: 80 mg/dL (ref 70–99)
Potassium: 4.2 mEq/L (ref 3.5–5.1)
Sodium: 138 mEq/L (ref 135–145)
Total Bilirubin: 0.4 mg/dL (ref 0.2–1.2)
Total Protein: 6.7 g/dL (ref 6.0–8.3)

## 2014-04-08 LAB — LIPID PANEL
CHOL/HDL RATIO: 4
Cholesterol: 188 mg/dL (ref 0–200)
HDL: 47.3 mg/dL (ref >39.00)
LDL Cholesterol: 118 mg/dL — ABNORMAL HIGH (ref 0–99)
NonHDL: 140.7
TRIGLYCERIDES: 115 mg/dL (ref 0.0–149.0)
VLDL: 23 mg/dL (ref 0.0–40.0)

## 2014-04-08 LAB — TSH: TSH: 1.44 u[IU]/mL (ref 0.35–4.50)

## 2014-04-09 ENCOUNTER — Ambulatory Visit (INDEPENDENT_AMBULATORY_CARE_PROVIDER_SITE_OTHER): Payer: BC Managed Care – PPO | Admitting: Family Medicine

## 2014-04-09 ENCOUNTER — Encounter: Payer: Self-pay | Admitting: Family Medicine

## 2014-04-09 VITALS — BP 112/76 | HR 79 | Temp 98.2°F | Wt 172.2 lb

## 2014-04-09 DIAGNOSIS — M797 Fibromyalgia: Secondary | ICD-10-CM

## 2014-04-09 DIAGNOSIS — G4700 Insomnia, unspecified: Secondary | ICD-10-CM

## 2014-04-09 DIAGNOSIS — E785 Hyperlipidemia, unspecified: Secondary | ICD-10-CM

## 2014-04-09 DIAGNOSIS — IMO0001 Reserved for inherently not codable concepts without codable children: Secondary | ICD-10-CM

## 2014-04-09 MED ORDER — SIMVASTATIN 10 MG PO TABS: ORAL_TABLET | ORAL | Status: DC

## 2014-04-09 NOTE — Progress Notes (Signed)
Pre visit review using our clinic review tool, if applicable. No additional management support is needed unless otherwise documented below in the visit note. 

## 2014-04-09 NOTE — Assessment & Plan Note (Signed)
Severe. Continue exercise. >15 minutes spent in face to face time with patient, >50% spent in counselling or coordination of care Prn percocet for severe pain.

## 2014-04-09 NOTE — Progress Notes (Signed)
Subjective:   Patient ID: [name], female    DOB: September 03, 1970, 43 y.o.   MRN: [id number]  [name] is a pleasant 43 y.o. year old female who presents to clinic today with Follow-up  on 04/09/2014  HPI: Insomnia- Has been on [place] for years.  Has failed every other OTC tx. She knows her body is dependent on it but cannot function without sleep.  She has not been abusing our controlled substances contract.  Difficulty falling and staying asleep without ambien.  HLD- On zocor 10 mg daily.  She has muscle aches constantly due to fibromyalgia- this is no worse with zocor. Lab Results  Component Value Date   CHOL 188 04/08/2014   HDL 47.30 04/08/2014   LDLCALC 118* 04/08/2014   LDLDIRECT 183.0 05/05/2010   TRIG 115.0 04/08/2014   CHOLHDL 4 04/08/2014   Lab Results  Component Value Date   ALT 17 04/08/2014   AST 20 04/08/2014   ALKPHOS 63 04/08/2014   BILITOT 0.4 04/08/2014   Fibromyalgia- worse lately- change in weather always makes it worse.  She is very active- multiple zumba classes per week and plays soft ball three days per week. Has been more fatigued lately. Tramadol was ineffective.  Does take prn percocet but tries not to.   Patient Active Problem List   Diagnosis Date Noted  . Vertigo 09/21/2012  . Cervical pain (neck) 11/24/2011  . Fibromyalgia 04/07/2011  . Other and unspecified hyperlipidemia 05/05/2010  . GENITAL HERPES 01/22/2010  . INSOMNIA, CHRONIC 01/22/2010  . GERD 01/22/2010  . BULIMIA, HX OF 01/22/2010   Past Medical History  Diagnosis Date  . Fibromyalgia     Dr. Corliss Skains  . Genital herpes   . Bulimia nervosa     history  . Bulging disc     T7/8-receiving ESI's (Dr. Charlett Blake)   Past Surgical History  Procedure Laterality Date  . Cesarean section    . Abdominal hysterectomy      endometriosis  . Rotator cuff repair    . Parotidectomy  2009    benign tumor   History  Substance Use Topics  . Smoking status: Never Smoker   .  Smokeless tobacco: Never Used  . Alcohol Use: Yes     Comment: occasional   Family History  Problem Relation Age of Onset  . Lung cancer Mother   . Stroke Father   . Breast cancer Maternal Aunt 54   Allergies  Allergen Reactions  . Codeine     REACTION: GI upset  . Doxycycline Nausea And Vomiting   Current Outpatient Prescriptions on File Prior to Visit  Medication Sig Dispense Refill  . albuterol (PROVENTIL HFA;VENTOLIN HFA) 108 (90 BASE) MCG/ACT inhaler Inhale 2 puffs into the lungs every 6 (six) hours as needed for wheezing or shortness of breath.  1 Inhaler  1  . lansoprazole (PREVACID) 30 MG capsule TAKE 1 CAPSULE BY MOUTH ONCE A DAY  30 capsule  5  . valACYclovir (VALTREX) 500 MG tablet TAKE 1 TABLET (500 MG TOTAL) BY MOUTH 2 (TWO) TIMES DAILY AS NEEDED.  60 tablet  0  . zolpidem (AMBIEN CR) 12.5 MG CR tablet TAKE 1 TABLET BY MOUTH AT BEDTIME AS NEEDED  30 tablet  0   No current facility-administered medications on file prior to visit.   The PMH, PSH, Social History, Family History, Medications, and allergies have been reviewed in [facility], and have been updated if relevant.    Review of Systems  See HPI No fevers or chill No nausea or vomiting No anxiety or depression No LE edema No blurred vision Objective:    BP 112/76  Pulse 79  Temp(Src) 98.2 F (36.8 C) (Oral)  Wt 172 lb 4 oz (78.132 kg)  SpO2 97%   Physical Exam  Nursing note and vitals reviewed. Constitutional: She appears well-developed and well-nourished. No distress.  Skin: Skin is warm and dry.  Psychiatric: She has a normal mood and affect. Her behavior is normal. Judgment and thought content normal.          Assessment & Plan:   Other and unspecified hyperlipidemia  INSOMNIA, CHRONIC  Fibromyalgia No Follow-up on file.

## 2014-04-09 NOTE — Assessment & Plan Note (Signed)
Stable on zocor 10 mg daily. Liver function good as well. No changes made today.

## 2014-04-09 NOTE — Assessment & Plan Note (Signed)
Persistent issues- worsening her fatigue. Takes ambien. Has failed other rx.

## 2014-04-11 ENCOUNTER — Encounter: Payer: Self-pay | Admitting: *Deleted

## 2014-04-18 ENCOUNTER — Other Ambulatory Visit: Payer: Self-pay | Admitting: Family Medicine

## 2014-04-18 NOTE — Telephone Encounter (Signed)
Last office visit 04/09/2014.  Ok to refill?

## 2014-04-18 NOTE — Telephone Encounter (Signed)
Klonopin called to [facility].

## 2014-04-28 ENCOUNTER — Other Ambulatory Visit: Payer: Self-pay | Admitting: *Deleted

## 2014-04-28 MED ORDER — LANSOPRAZOLE 30 MG PO CPDR: DELAYED_RELEASE_CAPSULE | ORAL | Status: DC

## 2014-04-28 MED ORDER — SIMVASTATIN 10 MG PO TABS: ORAL_TABLET | ORAL | Status: DC

## 2014-04-28 MED ORDER — BUPROPION HCL ER (XL) 300 MG PO TB24: 300.0000 mg | ORAL_TABLET | Freq: Every day | ORAL | Status: DC

## 2014-05-05 ENCOUNTER — Other Ambulatory Visit: Payer: Self-pay | Admitting: *Deleted

## 2014-05-05 MED ORDER — OXYCODONE-ACETAMINOPHEN 5-325 MG PO TABS: 1.0000 | ORAL_TABLET | Freq: Every day | ORAL | Status: DC

## 2014-05-05 NOTE — Telephone Encounter (Signed)
Pt request Percocet refill, last ov was 04/09/14.

## 2014-05-06 ENCOUNTER — Telehealth: Payer: Self-pay | Admitting: *Deleted

## 2014-05-06 MED ORDER — ZOLPIDEM TARTRATE ER 12.5 MG PO TBCR: EXTENDED_RELEASE_TABLET | ORAL | Status: DC

## 2014-05-06 NOTE — Telephone Encounter (Signed)
Pt requesting medication refill. Last f/u 03/2014 with no future appts scheduled. Ok to fill per Dr Dayton Martes. Will be faxed to requested pharmacy by end of day 05/07/14

## 2014-05-07 MED ORDER — OXYCODONE-ACETAMINOPHEN 5-325 MG PO TABS: 1.0000 | ORAL_TABLET | Freq: Every day | ORAL | Status: DC

## 2014-05-07 NOTE — Telephone Encounter (Signed)
Spoke to pt and informed her Rx will be available for pickup after 1400 

## 2014-05-07 NOTE — Addendum Note (Signed)
Addended by: Desmond Dike on: 05/07/2014 10:26 AM   Modules accepted: Orders

## 2014-05-07 NOTE — Telephone Encounter (Signed)
Pt left v/m requesting cb on status of ambien refill. 

## 2014-05-07 NOTE — Telephone Encounter (Signed)
Pt left v/m requesting cb about percocet rx.

## 2014-05-08 ENCOUNTER — Encounter: Payer: Self-pay | Admitting: Family Medicine

## 2014-05-19 ENCOUNTER — Other Ambulatory Visit: Payer: Self-pay | Admitting: *Deleted

## 2014-05-19 MED ORDER — CLONAZEPAM 0.5 MG PO TABS: ORAL_TABLET | ORAL | Status: DC

## 2014-05-19 NOTE — Telephone Encounter (Signed)
Pt requesting medication refill. Last f/u appt 03/2014. Ok to fill per Dr Aron. Rx to be faxed to requested pharmacy before end of day, today.  

## 2014-05-20 ENCOUNTER — Other Ambulatory Visit: Payer: Self-pay | Admitting: *Deleted

## 2014-05-20 MED ORDER — CYCLOBENZAPRINE HCL 10 MG PO TABS: ORAL_TABLET | ORAL | Status: DC

## 2014-05-23 ENCOUNTER — Encounter: Payer: Self-pay | Admitting: Family Medicine

## 2014-06-03 ENCOUNTER — Other Ambulatory Visit: Payer: Self-pay | Admitting: *Deleted

## 2014-06-03 MED ORDER — ZOLPIDEM TARTRATE ER 12.5 MG PO TBCR: EXTENDED_RELEASE_TABLET | ORAL | Status: DC

## 2014-06-03 NOTE — Telephone Encounter (Signed)
Pt requesting medication refill. Last f/u appt 03/2014. Ok to fill per Dr Aron. Rx to be faxed to requested pharmacy before end of day, today.  

## 2014-06-17 ENCOUNTER — Other Ambulatory Visit: Payer: Self-pay | Admitting: *Deleted

## 2014-06-17 MED ORDER — CLONAZEPAM 0.5 MG PO TABS: ORAL_TABLET | ORAL | Status: DC

## 2014-06-17 MED ORDER — CYCLOBENZAPRINE HCL 10 MG PO TABS: ORAL_TABLET | ORAL | Status: DC

## 2014-06-17 NOTE — Telephone Encounter (Signed)
Pt requesting medication refill. Last f/u appt 03/2014. Ok to fill per Dr Aron. Rx to be faxed to requested pharmacy before end of day, today.  

## 2014-07-01 ENCOUNTER — Other Ambulatory Visit: Payer: Self-pay | Admitting: *Deleted

## 2014-07-01 MED ORDER — ZOLPIDEM TARTRATE ER 12.5 MG PO TBCR: EXTENDED_RELEASE_TABLET | ORAL | Status: DC

## 2014-07-01 NOTE — Telephone Encounter (Signed)
Pt requesting medication refill. Last f/u appt 03/2014. pls advise 

## 2014-07-01 NOTE — Telephone Encounter (Signed)
Rx called in to requested pharmacy 

## 2014-07-21 ENCOUNTER — Other Ambulatory Visit: Payer: Self-pay | Admitting: *Deleted

## 2014-07-21 MED ORDER — CLONAZEPAM 0.5 MG PO TABS: ORAL_TABLET | ORAL | Status: DC

## 2014-07-21 NOTE — Telephone Encounter (Signed)
Pt requesting medication refill. Last f/u appt 03/2014. pls advise 

## 2014-07-21 NOTE — Telephone Encounter (Signed)
Rx faxed to requested pharmacy 

## 2014-07-25 ENCOUNTER — Other Ambulatory Visit: Payer: Self-pay | Admitting: *Deleted

## 2014-07-25 MED ORDER — VALACYCLOVIR HCL 500 MG PO TABS: ORAL_TABLET | ORAL | Status: DC

## 2014-07-25 NOTE — Telephone Encounter (Signed)
Faxed refill request. Last Filled:    60 tablet 0 on 10/17/13  Please advise.

## 2014-07-30 ENCOUNTER — Other Ambulatory Visit: Payer: Self-pay | Admitting: *Deleted

## 2014-07-30 MED ORDER — BUPROPION HCL ER (XL) 300 MG PO TB24: 300.0000 mg | ORAL_TABLET | Freq: Every day | ORAL | Status: DC

## 2014-07-30 NOTE — Telephone Encounter (Signed)
Pt requesting medication refill. Last f/u appt 03/2014. Pls advise

## 2014-07-31 MED ORDER — ZOLPIDEM TARTRATE ER 12.5 MG PO TBCR: EXTENDED_RELEASE_TABLET | ORAL | Status: DC

## 2014-07-31 NOTE — Telephone Encounter (Signed)
Rx called in to requested pharmacy 

## 2014-08-14 ENCOUNTER — Other Ambulatory Visit: Payer: Self-pay | Admitting: Obstetrics and Gynecology

## 2014-08-14 DIAGNOSIS — R928 Other abnormal and inconclusive findings on diagnostic imaging of breast: Secondary | ICD-10-CM

## 2014-08-18 ENCOUNTER — Other Ambulatory Visit: Payer: Self-pay

## 2014-08-18 MED ORDER — OXYCODONE-ACETAMINOPHEN 5-325 MG PO TABS
1.0000 | ORAL_TABLET | Freq: Every day | ORAL | Status: DC
Start: 2014-08-18 — End: 2014-11-03

## 2014-08-18 NOTE — Telephone Encounter (Signed)
Spoke to pt and informed her Rx is available for pickup from the front desk 

## 2014-08-18 NOTE — Telephone Encounter (Signed)
Pt left v/m requesting rx oxycodone apap. Call when ready for pick up . Last seen 04/09/14.Please advise.

## 2014-08-29 ENCOUNTER — Ambulatory Visit
Admission: RE | Admit: 2014-08-29 | Discharge: 2014-08-29 | Disposition: A | Discharge status: Home / Self Care | Payer: BLUE CROSS/BLUE SHIELD | Source: Ambulatory Visit | Attending: Obstetrics and Gynecology | Admitting: Obstetrics and Gynecology

## 2014-08-29 ENCOUNTER — Other Ambulatory Visit: Payer: Self-pay | Admitting: Obstetrics and Gynecology

## 2014-08-29 DIAGNOSIS — R928 Other abnormal and inconclusive findings on diagnostic imaging of breast: Secondary | ICD-10-CM

## 2014-08-30 ENCOUNTER — Other Ambulatory Visit: Payer: Self-pay | Admitting: Family Medicine

## 2014-09-01 NOTE — Telephone Encounter (Signed)
Last office visit 04/09/2014.  Last refilled 07/31/2014 for #30 with no refills.  Ok to refill?

## 2014-09-01 NOTE — Telephone Encounter (Signed)
Called to [facility]. 

## 2014-09-04 ENCOUNTER — Other Ambulatory Visit: Payer: Self-pay | Admitting: Obstetrics and Gynecology

## 2014-09-04 DIAGNOSIS — R928 Other abnormal and inconclusive findings on diagnostic imaging of breast: Secondary | ICD-10-CM

## 2014-09-08 ENCOUNTER — Encounter: Payer: Self-pay | Admitting: Family Medicine

## 2014-09-10 ENCOUNTER — Ambulatory Visit
Admission: RE | Admit: 2014-09-10 | Discharge: 2014-09-10 | Disposition: A | Discharge status: Home / Self Care | Payer: BLUE CROSS/BLUE SHIELD | Source: Ambulatory Visit | Attending: Obstetrics and Gynecology | Admitting: Obstetrics and Gynecology

## 2014-09-10 DIAGNOSIS — R928 Other abnormal and inconclusive findings on diagnostic imaging of breast: Secondary | ICD-10-CM

## 2014-09-12 ENCOUNTER — Other Ambulatory Visit: Payer: Self-pay | Admitting: Family Medicine

## 2014-09-25 ENCOUNTER — Other Ambulatory Visit: Payer: Self-pay | Admitting: Family Medicine

## 2014-09-25 IMAGING — CT CT ABD-PELV W/O CM
2 of 4 series · 17 of 46 positions shown, 19 images · non-contrast
Comparison: CT ABD W/CM dated 08/07/2003

CLINICAL DATA: Right flank pain. Right lower quadrant pain since
[REDACTED]. Hematuria. Difficulty urinating.

EXAM:
CT ABDOMEN AND PELVIS WITHOUT CONTRAST
TECHNIQUE: Multidetector CT imaging of the abdomen and pelvis was performed
following the standard protocol without intravenous contrast.

[Series 2: 5mm stone study - 160 eff. mas · axial · 0.63mm/px · z∈[-471,-61]mm · 14 of 90 slices shown, 16 images]
[im 4/90  soft-tissue]
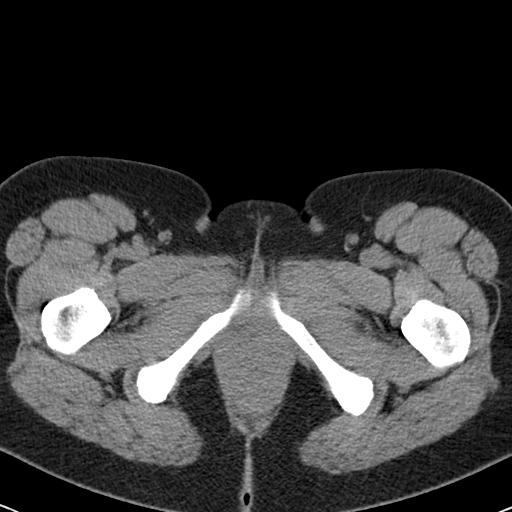
[im 4/90  bone]
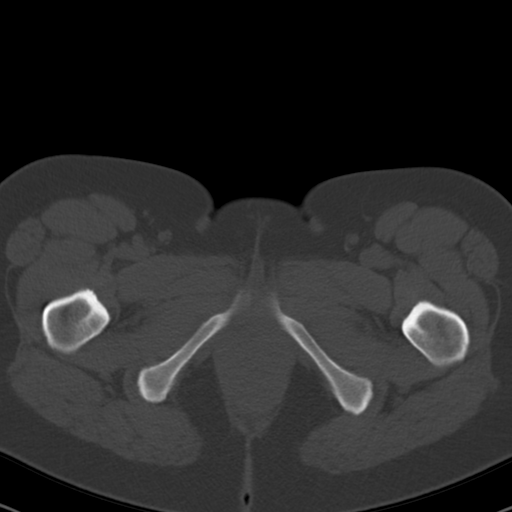
[im 11/90  soft-tissue]
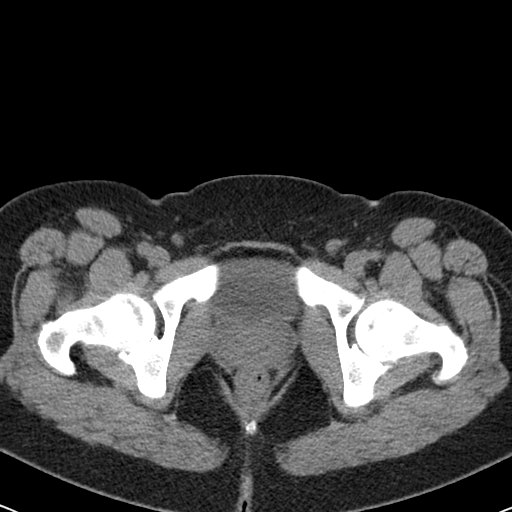
[im 18/90  soft-tissue]
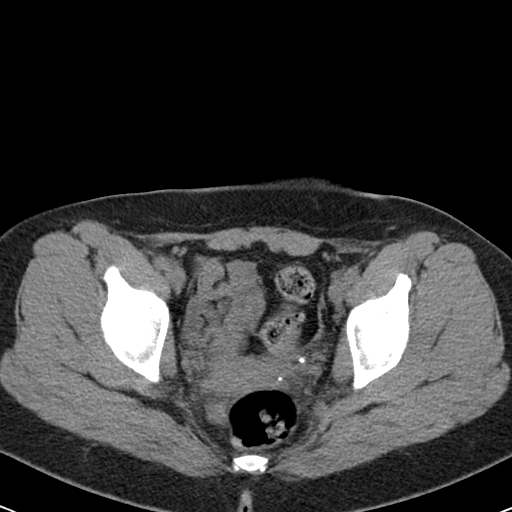
[im 25/90  soft-tissue]
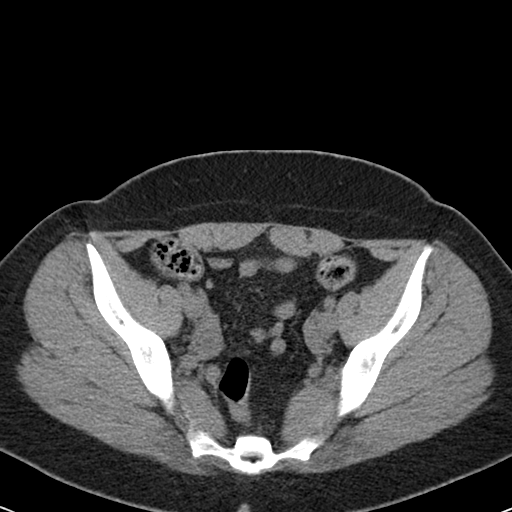
[im 29/90  soft-tissue]
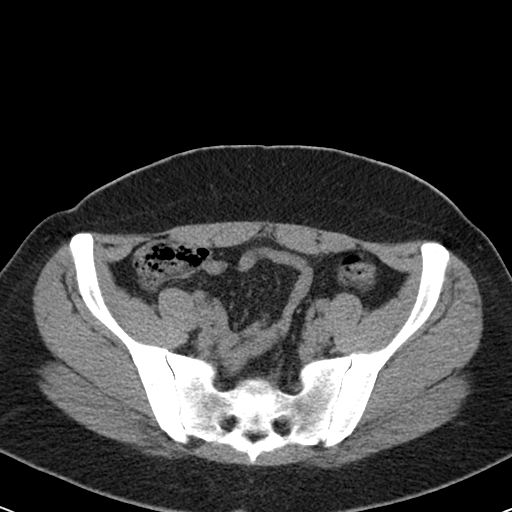
[im 36/90  soft-tissue]
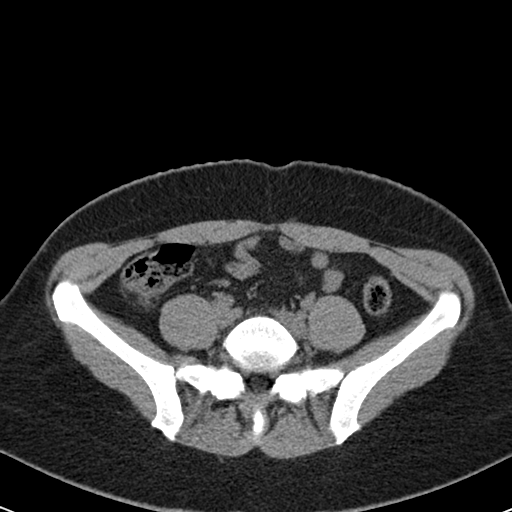
[im 43/90  soft-tissue]
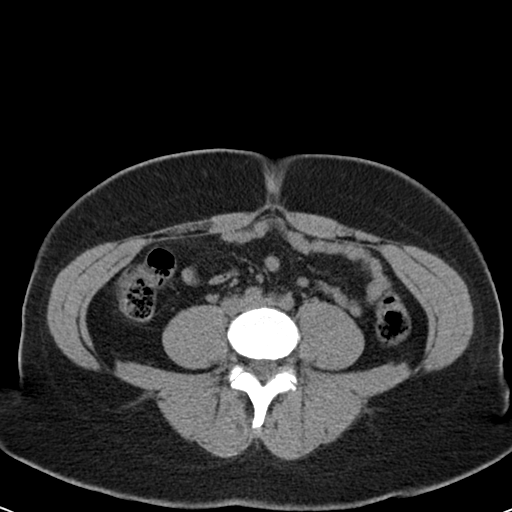
[im 47/90  soft-tissue]
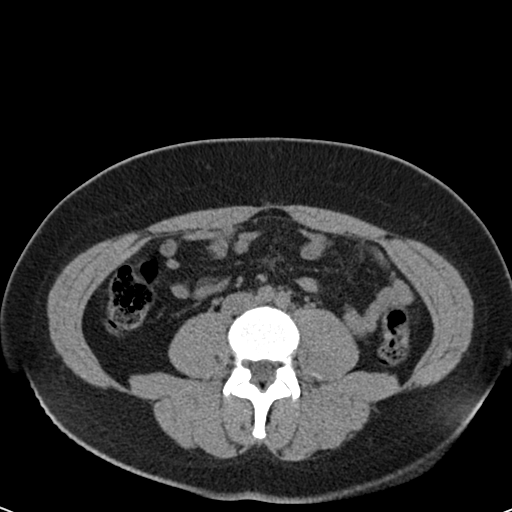
[im 54/90  soft-tissue]
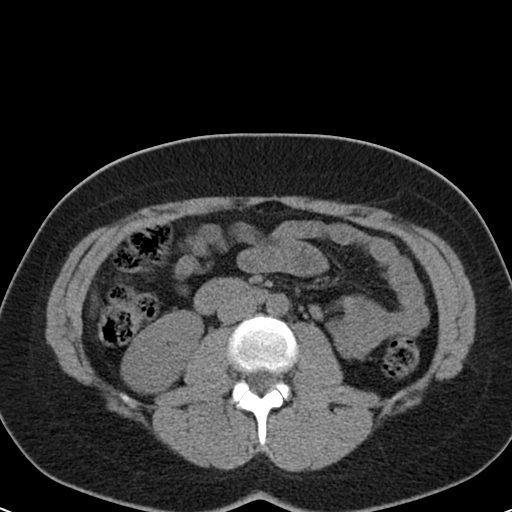
[im 54/90  bone]
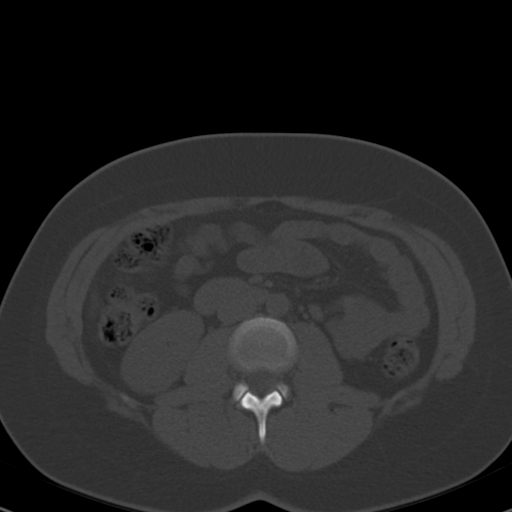
[im 61/90  soft-tissue]
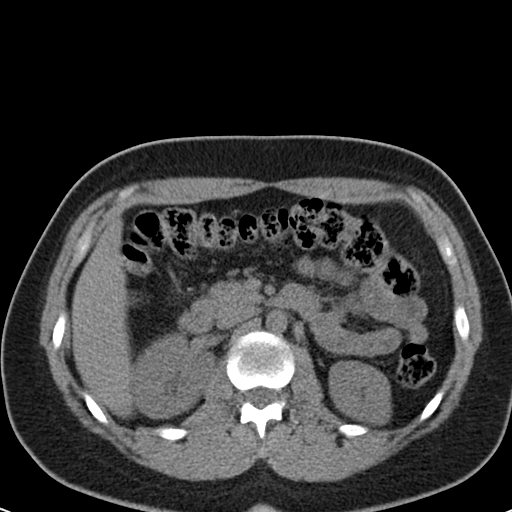
[im 68/90  soft-tissue]
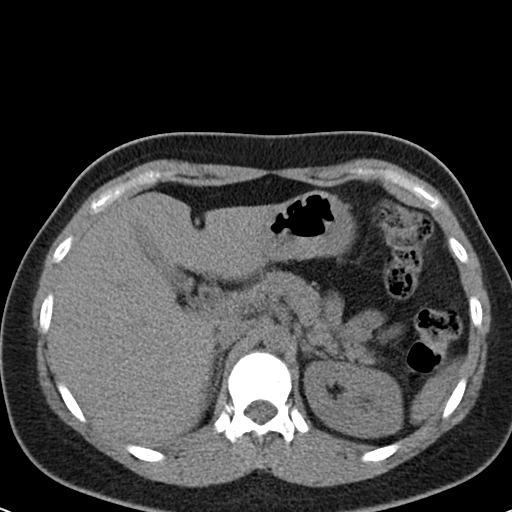
[im 72/90  soft-tissue]
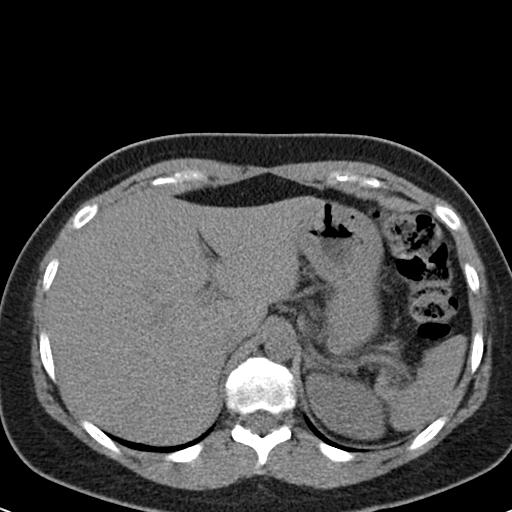
[im 79/90  soft-tissue]
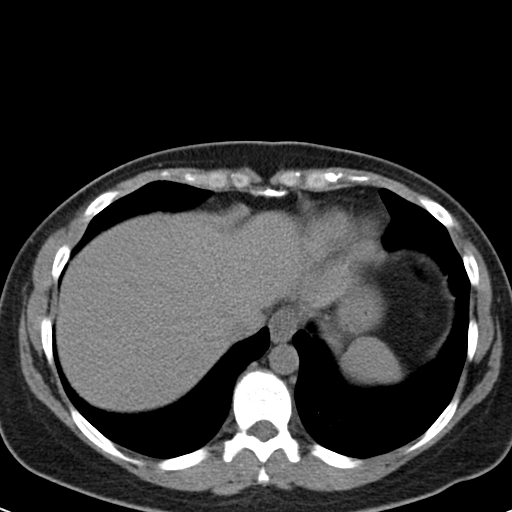
[im 86/90  soft-tissue]
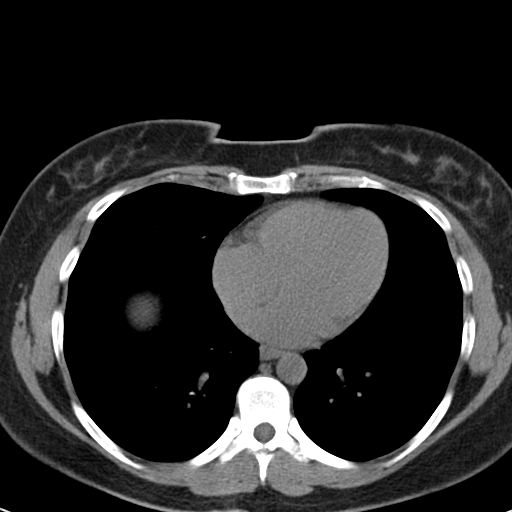

[Series 602: cor · coronal · 0.91mm/px · 3 of 106 slices shown]
[im 36/106  soft-tissue]
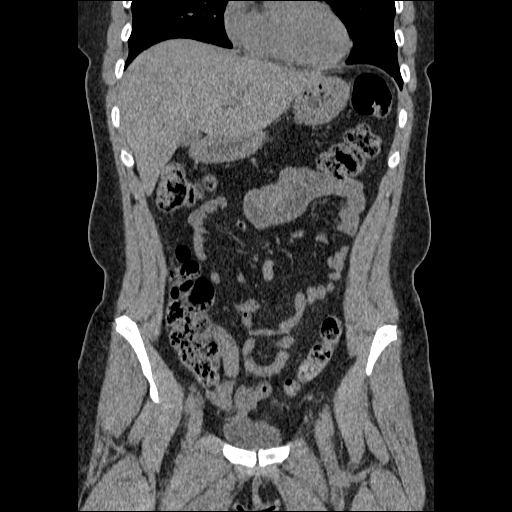
[im 47/106  soft-tissue]
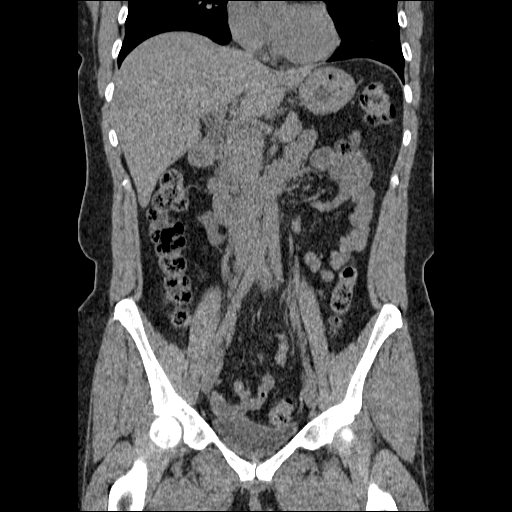
[im 59/106  soft-tissue]
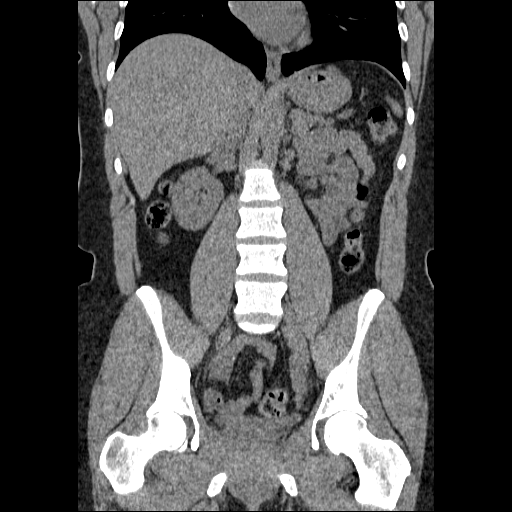

[17 of 46 positions shown; findings below may reference images not displayed]

FINDINGS: Lung Bases: Normal.

Liver: Unenhanced CT was performed per clinician order. Lack of IV
contrast limits sensitivity and specificity, especially for
evaluation of abdominal/pelvic solid viscera. Normal.

Spleen:  Normal.

Gallbladder:  Normal.

Common bile duct:  Normal.

Pancreas:  Normal.

Adrenal glands:  Normal bilaterally.

Kidneys: Normal. No hydronephrosis. No renal calculi. Both ureters
are within normal limits.

Stomach:  Normal.

Small bowel: Normal duodenum. Small bowel mesentery in normal. Small
bowel loops appear within normal limits. No obstruction.

Colon: Normal appendix. No inflammatory changes of colon.
Rectosigmoid appears normal.

Pelvic Genitourinary: Hysterectomy. Urinary bladder appears normal.

Bones: Mild bilateral SI joint osteoarthritis. No aggressive osseous
lesions.

Vasculature: Normal.

Body Wall: Normal.
IMPRESSION: 1. No acute abnormality.  Negative for urolithiasis.
2. Hysterectomy.

## 2014-10-06 ENCOUNTER — Other Ambulatory Visit: Payer: Self-pay | Admitting: Family Medicine

## 2014-10-06 NOTE — Telephone Encounter (Signed)
Rx called in to requested pharmacy 

## 2014-10-06 NOTE — Telephone Encounter (Signed)
Last f/u appt 03/2014 

## 2014-10-09 ENCOUNTER — Telehealth: Payer: Self-pay | Admitting: Family Medicine

## 2014-10-09 MED ORDER — FLUCONAZOLE 150 MG PO TABS: 150.0000 mg | ORAL_TABLET | Freq: Once | ORAL | Status: AC

## 2014-10-09 NOTE — Telephone Encounter (Signed)
eRx sent for diflucan.  If symptoms do not improve, she needs to be evaluated.

## 2014-10-09 NOTE — Telephone Encounter (Signed)
[facility] for strep throat they gave her an antibiotic now she has yeast infection Can you call something in for her  [facility].

## 2014-10-09 NOTE — Telephone Encounter (Signed)
Left voicemail for patient to call back. 

## 2014-10-09 NOTE — Telephone Encounter (Signed)
Patient called back. Notified her of Dr Elmer SowAron's comments.

## 2014-10-09 NOTE — Telephone Encounter (Signed)
Please advise 

## 2014-10-17 ENCOUNTER — Other Ambulatory Visit: Payer: Self-pay | Admitting: Family Medicine

## 2014-10-17 NOTE — Telephone Encounter (Signed)
Last f/u appt 03/2014 

## 2014-10-20 NOTE — Telephone Encounter (Signed)
Rx called in to requested pharmacy 

## 2014-10-24 ENCOUNTER — Other Ambulatory Visit: Payer: Self-pay | Admitting: Family Medicine

## 2014-10-26 ENCOUNTER — Other Ambulatory Visit: Payer: Self-pay | Admitting: Family Medicine

## 2014-10-28 ENCOUNTER — Other Ambulatory Visit: Payer: Self-pay | Admitting: Family Medicine

## 2014-10-29 ENCOUNTER — Other Ambulatory Visit: Payer: Self-pay | Admitting: Family Medicine

## 2014-11-02 ENCOUNTER — Other Ambulatory Visit: Payer: Self-pay | Admitting: Family Medicine

## 2014-11-03 ENCOUNTER — Telehealth: Payer: Self-pay

## 2014-11-03 ENCOUNTER — Other Ambulatory Visit: Payer: Self-pay | Admitting: Family Medicine

## 2014-11-03 MED ORDER — OXYCODONE-ACETAMINOPHEN 5-325 MG PO TABS: 1.0000 | ORAL_TABLET | Freq: Every day | ORAL | Status: DC

## 2014-11-03 MED ORDER — CYCLOBENZAPRINE HCL 10 MG PO TABS: ORAL_TABLET | ORAL | Status: DC

## 2014-11-03 NOTE — Telephone Encounter (Signed)
Ok to refill flexeril and oxycodone but too soon to refill Clonazepam.

## 2014-11-03 NOTE — Telephone Encounter (Signed)
Last f/u appt 03/2014 

## 2014-11-03 NOTE — Telephone Encounter (Addendum)
Pt left v/m requesting rx for oxycodone apap. Call when ready for pick up. Last printed 08/18/14. Pt also request refill for clonazepam (last printed 10/17/14), flexeril (last refilled 09/12/14 # 30 with 2 refills but [facility] said pt does not have any available refills).Please advise. Pt last seen 04/09/14 and no future appt scheduled.[place] going [place] 11[place]07[place]14.

## 2014-11-03 NOTE — Telephone Encounter (Signed)
Lm on pts vm informing her Rx is available for pickup from the front desk 

## 2014-11-05 ENCOUNTER — Other Ambulatory Visit: Payer: Self-pay | Admitting: Family Medicine

## 2014-11-05 ENCOUNTER — Encounter: Payer: Self-pay | Admitting: Family Medicine

## 2014-11-24 ENCOUNTER — Other Ambulatory Visit: Payer: Self-pay | Admitting: Family Medicine

## 2014-11-24 NOTE — Telephone Encounter (Signed)
Last OV 04/09/2014, last filled 10/29/14 Note saidNeeds OV with labs before required

## 2014-11-24 NOTE — Telephone Encounter (Signed)
Ok to refill without OV.  She has had a recent lipid panel done (less than 1 year).

## 2014-11-24 NOTE — Telephone Encounter (Signed)
[facility], Patient notified.

## 2014-11-24 NOTE — Telephone Encounter (Signed)
Last OV 04/09/2014, last filled 11/03/2014

## 2014-11-25 ENCOUNTER — Encounter: Payer: Self-pay | Admitting: Family Medicine

## 2014-12-04 ENCOUNTER — Other Ambulatory Visit: Payer: Self-pay | Admitting: *Deleted

## 2014-12-04 MED ORDER — ZOLPIDEM TARTRATE ER 12.5 MG PO TBCR: 12.5000 mg | EXTENDED_RELEASE_TABLET | Freq: Every evening | ORAL | Status: DC | PRN

## 2014-12-04 NOTE — Telephone Encounter (Signed)
rx called in to requested pharmacy 

## 2014-12-05 NOTE — Telephone Encounter (Signed)
p request status of ambien refill; pt advised called in on 12/04/14; [facility]

## 2014-12-09 ENCOUNTER — Ambulatory Visit (INDEPENDENT_AMBULATORY_CARE_PROVIDER_SITE_OTHER): Payer: BLUE CROSS/BLUE SHIELD | Admitting: Family Medicine

## 2014-12-09 ENCOUNTER — Encounter: Payer: Self-pay | Admitting: Family Medicine

## 2014-12-09 VITALS — BP 110/66 | HR 108 | Temp 98.3°F | Wt 176.8 lb

## 2014-12-09 DIAGNOSIS — N3001 Acute cystitis with hematuria: Secondary | ICD-10-CM | POA: Diagnosis not present

## 2014-12-09 LAB — POCT URINALYSIS DIPSTICK
Bilirubin, UA: NEGATIVE
Glucose, UA: NEGATIVE
KETONES UA: NEGATIVE
LEUKOCYTES UA: NEGATIVE
Nitrite, UA: NEGATIVE
PH UA: 6
Protein, UA: NEGATIVE
Spec Grav, UA: 1.015
UROBILINOGEN UA: 0.2

## 2014-12-09 MED ORDER — CIPROFLOXACIN HCL 500 MG PO TABS: 500.0000 mg | ORAL_TABLET | Freq: Two times a day (BID) | ORAL | Status: DC

## 2014-12-09 NOTE — Progress Notes (Signed)
SUBJECTIVE: [name] is a 44 y.o. female who complains of urinary frequency, urgency and dysuria x 2 days, without flank pain, fever, chills, or abnormal vaginal discharge or bleeding.   Often gets these symptoms- ? 4 times a year- and often UA and urine cx neg. Current Outpatient Prescriptions on File Prior to Visit  Medication Sig Dispense Refill  . albuterol (PROVENTIL HFA;VENTOLIN HFA) 108 (90 BASE) MCG/ACT inhaler Inhale 2 puffs into the lungs every 6 (six) hours as needed for wheezing or shortness of breath. 1 Inhaler 1  . buPROPion (WELLBUTRIN XL) 300 MG 24 hr tablet TAKE 1 TABLET (300 MG TOTAL) BY MOUTH DAILY. 90 tablet 0  . clonazePAM (KLONOPIN) 0.5 MG tablet TAKE 1 TABLET BY MOUTH AT BEDTIME AS NEEDED 30 tablet 0  . cyclobenzaprine (FLEXERIL) 10 MG tablet TAKE 1 TABLET BY MOUTH AT BEDTIME AS NEEDED MUSCLE SPASMS 30 tablet 0  . estrogens, conjugated, (PREMARIN) 0.45 MG tablet Take 0.45 mg by mouth daily. Take daily for 21 days then do not take for 7 days.    . lansoprazole (PREVACID) 30 MG capsule TAKE 1 CAPSULE BY MOUTH ONCE A DAY 90 capsule 1  . meclizine (ANTIVERT) 50 MG tablet Take 50 mg by mouth 3 (three) times daily as needed.    Marland Kitchen. oxyCODONE-acetaminophen (PERCOCET/ROXICET) 5-325 MG per tablet Take 1 tablet by mouth daily. 30 tablet 0  . simvastatin (ZOCOR) 10 MG tablet TAKE 1 TABLET BY MOUTH AT BEDTIME 30 tablet 0  . valACYclovir (VALTREX) 500 MG tablet TAKE 1 TABLET (500 MG TOTAL) BY MOUTH 2 (TWO) TIMES DAILY AS NEEDED. 60 tablet 0  . zolpidem (AMBIEN CR) 12.5 MG CR tablet Take 1 tablet (12.5 mg total) by mouth at bedtime as needed. 30 tablet 0   No current facility-administered medications on file prior to visit.    Allergies  Allergen Reactions  . Codeine     REACTION: GI upset  . Doxycycline Nausea And Vomiting    Past Medical History  Diagnosis Date  . Fibromyalgia     Dr. Corliss Skainseveshwar  . Genital herpes   . Bulimia nervosa     history  . Bulging disc    T7/8-receiving ESI's (Dr. Charlett BlakeVoytek)    Past Surgical History  Procedure Laterality Date  . Cesarean section    . Abdominal hysterectomy      endometriosis  . Rotator cuff repair    . Parotidectomy  2009    benign tumor    Family History  Problem Relation Age of Onset  . Lung cancer Mother   . Stroke Father   . Breast cancer Maternal Aunt [id number]    History   Social History  . Marital Status: Divorced    Spouse Name: N/A  . Number of Children: 1  . Years of Education: N/A   Occupational History  . IT Emergency planning/management officerproject manager    Social History Main Topics  . Smoking status: Never Smoker   . Smokeless tobacco: Never Used  . Alcohol Use: Yes     Comment: occasional  . Drug Use: No  . Sexual Activity: Not on file   Other Topics Concern  . Not on file   Social History Narrative   Regular exercise-yes      Plays softball regularly      Has 1 daughter       [place]      IT Emergency planning/management officerproject manager for [employer]  The PMH, PSH, Social History, Family History, Medications, and allergies have been reviewed in [facility], and have been updated if relevant.  OBJECTIVE:  BP 110/66 mmHg  Pulse 108  Temp(Src) 98.3 F (36.8 C) (Oral)  Wt 176 lb 12 oz (80.173 kg)  SpO2 97%  Appears well, in no apparent distress.  Vital signs are normal. The abdomen is soft without tenderness, guarding, mass, rebound or organomegaly. No CVA tenderness or inguinal adenopathy noted. Urine dipstick shows positive for RBC's.   ASSESSMENT: UTI uncomplicated without evidence of pyelonephritis ? IC as well- refer to urology if symptoms persist.  PLAN: Treatment per orders - send urine for cx, cipro 500 mg twice daily x 3 days, also push fluids, may use Pyridium OTC prn. Call or return to clinic prn if these symptoms worsen or fail to improve as anticipated.

## 2014-12-09 NOTE — Progress Notes (Signed)
Pre visit review using our clinic review tool, if applicable. No additional management support is needed unless otherwise documented below in the visit note. 

## 2014-12-09 NOTE — Addendum Note (Signed)
Addended by: Desmond DikeKNIGHT, Adjoa Althouse H on: 12/09/2014 01:26 PM   Modules accepted: Orders

## 2014-12-09 NOTE — Patient Instructions (Signed)
Good to see you. Please take cipro as directed- 1 tablet twice daily x 3 days.  We will call you with your urine culture results.  Interstitial Cystitis Interstitial cystitis (IC) is a condition that results in discomfort or pain in the bladder and the surrounding pelvic region. The symptoms can be different from case to case and even in the same individual. People may experience:  Mild discomfort.  Pressure.  Tenderness.  Intense pain in the bladder and pelvic area. CAUSES  Because IC varies so much in symptoms and severity, people studying this disease believe it is not one but several diseases. Some caregivers use the term painful bladder syndrome (PBS) to describe cases with painful urinary symptoms. This may not meet the strictest definition of IC. The term IC / PBS includes all cases of urinary pain that cannot be connected to other causes, such as infection or urinary stones.  SYMPTOMS  Symptoms may include:  An urgent need to urinate.  A frequent need to urinate.  A combination of these symptoms. Pain may change in intensity as the bladder fills with urine or as it empties. Women's symptoms often get worse during menstruation. They may sometimes experience pain with vaginal intercourse. Some of the symptoms of IC / PBS seem like those of bacterial infection. Tests do not show infection. IC / PBS is far more common in women than in men.  DIAGNOSIS  The diagnosis of IC / PBS is based on:  Presence of pain related to the bladder, usually along with problems of frequency and urgency.  Not finding other diseases that could cause the symptoms.  Diagnostic tests that help rule out other diseases include:  Urinalysis.  Urine culture.  Cystoscopy.  Biopsy of the bladder wall.  Distension of the bladder under anesthesia.  Urine cytology.  Laboratory examination of prostate secretions. A biopsy is a tissue sample that can be looked at under a microscope. Samples of the  bladder and urethra may be removed during a cystoscopy. A biopsy helps rule out bladder cancer. TREATMENT  Scientists have not yet found a cure for IC / PBS. Patients with IC / PBS do not get better with antibiotic therapy. Caregivers cannot predict who will respond best to which treatment. Symptoms may disappear without explanation. Disappearing symptoms may coincide with an event such as a change in diet or treatment. Even when symptoms disappear, they may return after days, weeks, months, or years.  Because the causes of IC / PBS are unknown, current treatments are aimed at relieving symptoms. Many people are helped by one or a combination of the treatments. As researchers learn more about IC / PBS, the list of potential treatments will change. Patients should discuss their options with a caregiver. SURGERY  Surgery should be considered only if all available treatments have failed and the pain is disabling. Many approaches and techniques are used. Each approach has its own advantages and complications. Advantages and complications should be discussed with a urologist. Your caregiver may recommend consulting another urologist for a second opinion. Most caregivers are reluctant to operate because the outcome is unpredictable. Some people still have symptoms after surgery.  People considering surgery should discuss the potential risks and benefits, side effects, and long- and short-term complications with their family, as well as with people who have already had the procedure. Surgery requires anesthesia, hospitalization, and in some cases weeks or months of recovery. As the complexity of the procedure increases, so do the chances for complications and  for failure. HOME CARE INSTRUCTIONS   All drugs, even those sold over the counter, have side effects. Patients should always consult a caregiver before using any drug for an extended amount of time. Only take over-the-counter or prescription medicines for  pain, discomfort, or fever as directed by your caregiver.  Many patients feel that smoking makes their symptoms worse. How the by-products of tobacco that are excreted in the urine affect IC / PBS is unknown. Smoking is the major known cause of bladder cancer. One of the best things smokers can do for their bladder and their overall health is to quit.  Many patients feel that gentle stretching exercises help relieve IC / PBS symptoms.  Methods vary, but basically patients decide to empty their bladder at designated times and use relaxation techniques and distractions to keep to the schedule. Gradually, patients try to lengthen the time between scheduled voids. A diary in which to record voiding times is usually helpful in keeping track of progress. MAKE SURE YOU:   Understand these instructions.  Will watch your condition.  Will get help right away if you are not doing well or get worse. Document Released: 04/01/2004 Document Revised: 10/24/2011 Document Reviewed: 06/16/2008 [facility] Patient Information 2015 [place], [place]. This information is not intended to replace advice given to you by your health care provider. Make sure you discuss any questions you have with your health care provider.

## 2014-12-11 LAB — URINE CULTURE
Colony Count: NO GROWTH
ORGANISM ID, BACTERIA: NO GROWTH

## 2014-12-12 ENCOUNTER — Other Ambulatory Visit: Payer: Self-pay | Admitting: Family Medicine

## 2014-12-12 DIAGNOSIS — R300 Dysuria: Secondary | ICD-10-CM

## 2014-12-18 ENCOUNTER — Other Ambulatory Visit: Payer: Self-pay | Admitting: Family Medicine

## 2014-12-19 ENCOUNTER — Other Ambulatory Visit: Payer: Self-pay | Admitting: Family Medicine

## 2014-12-22 NOTE — Telephone Encounter (Signed)
Received refill request electronically  Last refill 11/24/14 #30, last office visit 12/08/13/acute Is it okay to refill?

## 2014-12-22 NOTE — Telephone Encounter (Signed)
Rx  Called in to requested pharmacy

## 2014-12-30 ENCOUNTER — Other Ambulatory Visit: Payer: Self-pay | Admitting: Family Medicine

## 2014-12-31 NOTE — Telephone Encounter (Signed)
Rx called in to requested pharmacy 

## 2014-12-31 NOTE — Telephone Encounter (Signed)
Last f/u appt 03/2014 

## 2015-01-02 ENCOUNTER — Other Ambulatory Visit: Payer: Self-pay | Admitting: Family Medicine

## 2015-01-09 ENCOUNTER — Encounter: Payer: Self-pay | Admitting: *Deleted

## 2015-01-09 ENCOUNTER — Other Ambulatory Visit: Payer: Self-pay

## 2015-01-09 ENCOUNTER — Other Ambulatory Visit: Payer: Self-pay | Admitting: Family Medicine

## 2015-01-09 MED ORDER — OXYCODONE-ACETAMINOPHEN 5-325 MG PO TABS: 1.0000 | ORAL_TABLET | Freq: Every day | ORAL | Status: DC

## 2015-01-09 NOTE — Telephone Encounter (Signed)
Rx left in front office for pick up and pt is aware  

## 2015-01-09 NOTE — Telephone Encounter (Signed)
Pt left v/m requesting rx oxycodone apap. Call when ready for pick up. Last f/u appt08/26/15 and last sick visit 12/09/14 for flank pain. rx last printed 11/03/14 for # 30.Please advise. Dr Dayton MartesAron out of office.

## 2015-01-09 NOTE — Telephone Encounter (Signed)
Reviewed Dr. Dayton MartesAron last note 03/2015, prn percocet for fibromyalgia pain. Last refilled 11/03/14. RX printed, signed and placed in MYD box.

## 2015-01-16 ENCOUNTER — Other Ambulatory Visit: Payer: Self-pay | Admitting: Family Medicine

## 2015-01-19 ENCOUNTER — Other Ambulatory Visit: Payer: Self-pay | Admitting: Family Medicine

## 2015-01-26 ENCOUNTER — Other Ambulatory Visit: Payer: Self-pay | Admitting: Family Medicine

## 2015-01-26 NOTE — Telephone Encounter (Signed)
Rx called in to requested pharmacy 

## 2015-01-26 NOTE — Telephone Encounter (Signed)
Last f/u appt 03/2014 

## 2015-01-28 ENCOUNTER — Other Ambulatory Visit: Payer: Self-pay | Admitting: Family Medicine

## 2015-02-02 ENCOUNTER — Other Ambulatory Visit: Payer: Self-pay | Admitting: Family Medicine

## 2015-02-02 NOTE — Telephone Encounter (Signed)
Last f/u appt 03/2015 

## 2015-02-02 NOTE — Telephone Encounter (Signed)
Rx called in to requested pharmacy 

## 2015-02-07 ENCOUNTER — Other Ambulatory Visit: Payer: Self-pay | Admitting: Family Medicine

## 2015-02-09 ENCOUNTER — Other Ambulatory Visit: Payer: Self-pay | Admitting: Family Medicine

## 2015-02-11 ENCOUNTER — Other Ambulatory Visit: Payer: Self-pay

## 2015-02-11 MED ORDER — SIMVASTATIN 10 MG PO TABS: 10.0000 mg | ORAL_TABLET | Freq: Every day | ORAL | Status: DC

## 2015-02-11 NOTE — Telephone Encounter (Signed)
Pt left v/m requesting refill simvastatin to [facility]. Pt going out of town for one week on 02/13/15 and pt request refill of simvastatin and then pt will schedule appt after vacation. Pt request cb. rx last refilled # 30 on 12/19/14. Last seen 04/09/14.Please advise.

## 2015-02-22 ENCOUNTER — Other Ambulatory Visit: Payer: Self-pay | Admitting: Family Medicine

## 2015-02-23 NOTE — Telephone Encounter (Signed)
Refill received electronically. Last office visit 12/09/14 / Acute. Last refill 01/26/15 #30  Is it okay to refill?

## 2015-02-23 NOTE — Telephone Encounter (Signed)
Rx called in as directed.   

## 2015-03-02 ENCOUNTER — Other Ambulatory Visit: Payer: Self-pay | Admitting: Family Medicine

## 2015-03-02 NOTE — Telephone Encounter (Signed)
Pt has not had recent f/u. Last Rx indicates OV required

## 2015-03-02 NOTE — Telephone Encounter (Signed)
Rx called in to requested pharmacy 

## 2015-03-06 ENCOUNTER — Other Ambulatory Visit: Payer: Self-pay | Admitting: Family Medicine

## 2015-03-20 ENCOUNTER — Other Ambulatory Visit: Payer: Self-pay | Admitting: Family Medicine

## 2015-03-23 NOTE — Telephone Encounter (Signed)
Rx called in to requested pharmacy 

## 2015-03-23 NOTE — Telephone Encounter (Signed)
Last f/u appt 03/2014 

## 2015-04-01 ENCOUNTER — Other Ambulatory Visit: Payer: Self-pay | Admitting: Family Medicine

## 2015-04-01 NOTE — Telephone Encounter (Signed)
Pt last f/u appt 11/2013. Only been seen one additional time since for acute

## 2015-04-01 NOTE — Telephone Encounter (Signed)
Rx called in to requested pharmacy 

## 2015-04-13 ENCOUNTER — Telehealth: Payer: Self-pay | Admitting: *Deleted

## 2015-04-13 NOTE — Telephone Encounter (Signed)
Patient called stating that she needs a refill on her Simvastatin and was told by the pharmacy that she needs to be seen and labs prior to refills. Patient stated that the last time she was told this she came in and was told that it was not necessary. Patient wants to know by Dr. Dayton Martes if she needs to schedule an appointment?

## 2015-04-13 NOTE — Telephone Encounter (Signed)
She unfortunately does need yearly cholesterol labs.

## 2015-04-13 NOTE — Telephone Encounter (Signed)
Spoke to pt and advised per Dr Aron; f/u appt scheduled 

## 2015-04-16 ENCOUNTER — Other Ambulatory Visit: Payer: Self-pay | Admitting: Family Medicine

## 2015-04-17 NOTE — Telephone Encounter (Signed)
Pt has not had f/u appt since Aug 2015

## 2015-04-17 NOTE — Telephone Encounter (Signed)
Pt has upcoming 09/06 appt

## 2015-04-19 ENCOUNTER — Other Ambulatory Visit: Payer: Self-pay | Admitting: Family Medicine

## 2015-04-21 ENCOUNTER — Encounter: Payer: Self-pay | Admitting: Family Medicine

## 2015-04-21 ENCOUNTER — Ambulatory Visit (INDEPENDENT_AMBULATORY_CARE_PROVIDER_SITE_OTHER): Payer: BLUE CROSS/BLUE SHIELD | Admitting: Family Medicine

## 2015-04-21 VITALS — BP 112/68 | HR 84 | Temp 98.1°F | Wt 175.5 lb

## 2015-04-21 DIAGNOSIS — E785 Hyperlipidemia, unspecified: Secondary | ICD-10-CM | POA: Diagnosis not present

## 2015-04-21 DIAGNOSIS — M797 Fibromyalgia: Secondary | ICD-10-CM | POA: Diagnosis not present

## 2015-04-21 DIAGNOSIS — G4700 Insomnia, unspecified: Secondary | ICD-10-CM | POA: Diagnosis not present

## 2015-04-21 LAB — LDL CHOLESTEROL, DIRECT: Direct LDL: 122 mg/dL

## 2015-04-21 LAB — LIPID PANEL
Cholesterol: 204 mg/dL — ABNORMAL HIGH (ref 0–200)
HDL: 45.3 mg/dL (ref >39.00)
NonHDL: 158.7
TRIGLYCERIDES: 261 mg/dL — AB (ref 0.0–149.0)
Total CHOL/HDL Ratio: 5
VLDL: 52.2 mg/dL — AB (ref 0.0–40.0)

## 2015-04-21 LAB — COMPREHENSIVE METABOLIC PANEL
ALK PHOS: 67 U/L (ref 39–117)
ALT: 11 U/L (ref 0–35)
AST: 14 U/L (ref 0–37)
Albumin: 3.9 g/dL (ref 3.5–5.2)
BILIRUBIN TOTAL: 0.3 mg/dL (ref 0.2–1.2)
BUN: 14 mg/dL (ref 6–23)
CALCIUM: 9.2 mg/dL (ref 8.4–10.5)
CO2: 29 mEq/L (ref 19–32)
Chloride: 104 mEq/L (ref 96–112)
Creatinine, Ser: 0.91 mg/dL (ref 0.40–1.20)
GFR: 71.42 mL/min (ref >60.00)
Glucose, Bld: 84 mg/dL (ref 70–99)
POTASSIUM: 4.4 meq/L (ref 3.5–5.1)
Sodium: 141 mEq/L (ref 135–145)
TOTAL PROTEIN: 6.5 g/dL (ref 6.0–8.3)

## 2015-04-21 MED ORDER — CLONAZEPAM 0.5 MG PO TABS: 0.5000 mg | ORAL_TABLET | Freq: Every day | ORAL | Status: DC

## 2015-04-21 MED ORDER — SIMVASTATIN 10 MG PO TABS: ORAL_TABLET | ORAL | Status: DC

## 2015-04-21 MED ORDER — ZOLPIDEM TARTRATE ER 12.5 MG PO TBCR: 12.5000 mg | EXTENDED_RELEASE_TABLET | Freq: Every evening | ORAL | Status: DC | PRN

## 2015-04-21 MED ORDER — OXYCODONE-ACETAMINOPHEN 5-325 MG PO TABS
1.0000 | ORAL_TABLET | Freq: Every day | ORAL | Status: DC
Start: 2015-04-21 — End: 2015-07-01

## 2015-04-21 MED ORDER — CYCLOBENZAPRINE HCL 10 MG PO TABS: ORAL_TABLET | ORAL | Status: DC

## 2015-04-21 NOTE — Assessment & Plan Note (Signed)
Chronic. She is aware that she is dependent on rxs for sleep. Failed conservative and OTC rxs. Rxs refilled today.

## 2015-04-21 NOTE — Progress Notes (Signed)
Subjective:   Patient ID: [name], female    DOB: 12-04-1970, 44 y.o.   MRN: [id number]  [name] is a pleasant 44 y.o. year old female who presents to clinic today with Follow-up and Medication Refill  on 04/21/2015  HPI: Insomnia- Has been on [place] and klonipin for years.  Has failed every other OTC tx. She knows her body is dependent on it but cannot function without sleep. Has followed our controlled substances contract.   HLD- On zocor 10 mg daily.  She has muscle aches constantly due to fibromyalgia- this is no worse with zocor. Lab Results  Component Value Date   CHOL 188 04/08/2014   HDL 47.30 04/08/2014   LDLCALC 118* 04/08/2014   LDLDIRECT 183.0 05/05/2010   TRIG 115.0 04/08/2014   CHOLHDL 4 04/08/2014   Lab Results  Component Value Date   ALT 17 04/08/2014   AST 20 04/08/2014   ALKPHOS 63 04/08/2014   BILITOT 0.4 04/08/2014   Fibromyalgia- Improved now that she is exercising regularly- plays softball and takes [employer]. Concerned because fall is coming and symptoms tend to get worse during the fall. Does take prn percocet but tries not to.   Patient Active Problem List   Diagnosis Date Noted  . Vertigo 09/21/2012  . Cervical pain (neck) 11/24/2011  . Fibromyalgia 04/07/2011  . HLD (hyperlipidemia) 05/05/2010  . GENITAL HERPES 01/22/2010  . INSOMNIA, CHRONIC 01/22/2010  . GERD 01/22/2010  . BULIMIA, HX OF 01/22/2010   Past Medical History  Diagnosis Date  . Fibromyalgia     Dr. Corliss Skains  . Genital herpes   . Bulimia nervosa     history  . Bulging disc     T7/8-receiving ESI's (Dr. Charlett Blake)   Past Surgical History  Procedure Laterality Date  . Cesarean section    . Abdominal hysterectomy      endometriosis  . Rotator cuff repair    . Parotidectomy  2009    benign tumor   Social History  Substance Use Topics  . Smoking status: Never Smoker   . Smokeless tobacco: Never Used  . Alcohol Use: Yes     Comment: occasional    Family History  Problem Relation Age of Onset  . Lung cancer Mother   . Stroke Father   . Breast cancer Maternal Aunt 54   Allergies  Allergen Reactions  . Codeine     REACTION: GI upset  . Doxycycline Nausea And Vomiting   Current Outpatient Prescriptions on File Prior to Visit  Medication Sig Dispense Refill  . albuterol (PROVENTIL HFA;VENTOLIN HFA) 108 (90 BASE) MCG/ACT inhaler Inhale 2 puffs into the lungs every 6 (six) hours as needed for wheezing or shortness of breath. 1 Inhaler 1  . buPROPion (WELLBUTRIN XL) 300 MG 24 hr tablet TAKE 1 TABLET (300 MG TOTAL) BY MOUTH DAILY. 90 tablet 0  . estrogens, conjugated, (PREMARIN) 0.45 MG tablet Take 0.45 mg by mouth daily. Take daily for 21 days then do not take for 7 days.    . lansoprazole (PREVACID) 30 MG capsule TAKE 1 CAPSULE BY MOUTH ONCE A DAY 90 capsule 1  . meclizine (ANTIVERT) 50 MG tablet Take 50 mg by mouth 3 (three) times daily as needed.    . valACYclovir (VALTREX) 500 MG tablet TAKE 1 TABLET BY MOUTH TWICE A DAY AS NEEDED 60 tablet 0   No current facility-administered medications on file prior to visit.   The PMH, PSH, Social History, Family  History, Medications, and allergies have been reviewed in [facility], and have been updated if relevant.    Review of Systems  Constitutional: Negative.   Respiratory: Negative.   Cardiovascular: Negative.   Gastrointestinal: Negative.   Genitourinary: Negative.   Musculoskeletal: Positive for myalgias and arthralgias.  Skin: Negative.   Psychiatric/Behavioral: Negative.   All other systems reviewed and are negative.     Objective:    BP 112/68 mmHg  Pulse 84  Temp(Src) 98.1 F (36.7 C) (Oral)  Wt 175 lb 8 oz (79.606 kg)  SpO2 98%   Physical Exam  Constitutional: She is oriented to person, place, and time. She appears well-developed and well-nourished. No distress.  HENT:  Head: Normocephalic and atraumatic.  Eyes: Conjunctivae are normal.  Cardiovascular: Normal  rate and regular rhythm.   Pulmonary/Chest: Effort normal.  Musculoskeletal: Normal range of motion. She exhibits no edema.  Neurological: She is oriented to person, place, and time. No cranial nerve deficit.  Skin: Skin is warm and dry.  Psychiatric: She has a normal mood and affect. Her behavior is normal. Judgment and thought content normal.  Nursing note and vitals reviewed.         Assessment & Plan:   INSOMNIA, CHRONIC  Fibromyalgia  HLD (hyperlipidemia) No Follow-up on file.

## 2015-04-21 NOTE — Progress Notes (Signed)
Pre visit review using our clinic review tool, if applicable. No additional management support is needed unless otherwise documented below in the visit note. 

## 2015-04-21 NOTE — Assessment & Plan Note (Signed)
eRx sent for zocor. Labs today.

## 2015-04-21 NOTE — Assessment & Plan Note (Signed)
Continue regular physical activity. Rx given for percocet to use prn. Failed tramadol

## 2015-04-21 NOTE — Patient Instructions (Signed)
Great to see you.  We will call you with your lab results. 

## 2015-04-28 ENCOUNTER — Other Ambulatory Visit: Payer: Self-pay | Admitting: Family Medicine

## 2015-05-01 ENCOUNTER — Other Ambulatory Visit: Payer: Self-pay | Admitting: Family Medicine

## 2015-05-04 NOTE — Telephone Encounter (Signed)
Pt wanted to know why cannot get refill for ambien; advised rx given on 04/21/15 and too early to refill; pt will ck to see if picked up rx on 04/21/15 and if necessary will cb [facility].

## 2015-05-18 ENCOUNTER — Other Ambulatory Visit: Payer: Self-pay | Admitting: Family Medicine

## 2015-05-18 NOTE — Telephone Encounter (Signed)
Last f/u 04/2015 

## 2015-05-18 NOTE — Telephone Encounter (Signed)
Rx called in to requested pharmacy 

## 2015-05-29 ENCOUNTER — Other Ambulatory Visit: Payer: Self-pay | Admitting: Family Medicine

## 2015-05-29 NOTE — Telephone Encounter (Signed)
Ok to phone in ambien 

## 2015-05-29 NOTE — Telephone Encounter (Signed)
Rx called in to requested pharmacy 

## 2015-05-29 NOTE — Telephone Encounter (Signed)
Last f/u 04/2015 

## 2015-06-19 ENCOUNTER — Other Ambulatory Visit: Payer: Self-pay | Admitting: Family Medicine

## 2015-06-19 NOTE — Telephone Encounter (Signed)
rx called in to requested pharmacy 

## 2015-06-19 NOTE — Telephone Encounter (Signed)
Last f/u 04/2015 

## 2015-06-27 ENCOUNTER — Other Ambulatory Visit: Payer: Self-pay | Admitting: Family Medicine

## 2015-06-29 NOTE — Telephone Encounter (Signed)
pts last refill 01/2015. pls advise

## 2015-07-01 ENCOUNTER — Other Ambulatory Visit: Payer: Self-pay | Admitting: Family Medicine

## 2015-07-01 ENCOUNTER — Other Ambulatory Visit: Payer: Self-pay

## 2015-07-01 ENCOUNTER — Encounter: Payer: Self-pay | Admitting: Family Medicine

## 2015-07-01 MED ORDER — OXYCODONE-ACETAMINOPHEN 5-325 MG PO TABS: 1.0000 | ORAL_TABLET | Freq: Every day | ORAL | Status: DC

## 2015-07-01 NOTE — Telephone Encounter (Signed)
Rx called in to requested pharmacy 

## 2015-07-01 NOTE — Telephone Encounter (Signed)
Spoke to pt and informed her Rx is available for pickup at the front desk. Pt advised third party unable to pickup 

## 2015-07-01 NOTE — Telephone Encounter (Signed)
Last f/u 04/2015 

## 2015-07-01 NOTE — Telephone Encounter (Signed)
Pt left v/m requesting rx oxycodone apap. Call when ready for pick up. Pt last seen and rx last printed # 30 on 04/21/15.

## 2015-07-15 ENCOUNTER — Encounter: Payer: Self-pay | Admitting: Family Medicine

## 2015-07-15 ENCOUNTER — Ambulatory Visit (INDEPENDENT_AMBULATORY_CARE_PROVIDER_SITE_OTHER): Payer: BLUE CROSS/BLUE SHIELD | Admitting: Family Medicine

## 2015-07-15 ENCOUNTER — Other Ambulatory Visit: Payer: Self-pay | Admitting: Family Medicine

## 2015-07-15 VITALS — BP 134/78 | HR 99 | Temp 97.9°F | Wt 175.2 lb

## 2015-07-15 DIAGNOSIS — H8110 Benign paroxysmal vertigo, unspecified ear: Secondary | ICD-10-CM | POA: Insufficient documentation

## 2015-07-15 DIAGNOSIS — N951 Menopausal and female climacteric states: Secondary | ICD-10-CM

## 2015-07-15 DIAGNOSIS — R232 Flushing: Secondary | ICD-10-CM

## 2015-07-15 DIAGNOSIS — M1990 Unspecified osteoarthritis, unspecified site: Secondary | ICD-10-CM | POA: Diagnosis not present

## 2015-07-15 LAB — FOLLICLE STIMULATING HORMONE: FSH: 6 m[IU]/mL

## 2015-07-15 LAB — SEDIMENTATION RATE: SED RATE: 29 mm/h — AB (ref 0–22)

## 2015-07-15 NOTE — Patient Instructions (Signed)
Great to see you. Please stop by to see Marion on your way out.   

## 2015-07-15 NOTE — Assessment & Plan Note (Signed)
New- check labs today- possibly menopausal. The patient indicates understanding of these issues and agrees with the plan. Orders Placed This Encounter  Procedures  . Luteinizing hormone  . Follicle Stimulating Hormone  . Sedimentation Rate  . Rheumatoid Factor  . Cyclic Citrul Peptide Antibody, IGG  . Ambulatory referral to Physical Therapy

## 2015-07-15 NOTE — Assessment & Plan Note (Signed)
Persistent, refractory to meclizine. Refer to PT for vestibular rehab.  Order placed.

## 2015-07-15 NOTE — Assessment & Plan Note (Signed)
Possible OA but will check labs today to rule out other arthritic processes. The patient indicates understanding of these issues and agrees with the plan.

## 2015-07-15 NOTE — Progress Notes (Signed)
Subjective:   Patient ID: [name], female    DOB: 16-May-1971, 44 y.o.   MRN: [id number]  [name] is a pleasant 44 y.o. year old female who presents to clinic today with Joint Pain and Dizziness  on 07/15/2015  HPI:  Vertigo- h/o recurrent BPV.  Past few weeks, intermittent and "cant seem to shake it." Meclizine does help but when she changes head positions, she feels room spinning. No nausea or vomiting. She is concerned because it is affecting her driving.  Joint paint- waking up with her hands very stiff.  Has fibromyalgia but has never had this symptom with it and seems to be progressing in terms of frequency and severity.  No joint warmth or erythema.  Hot flashes- remote history of hysterectomy.  Now having hot flashes, more insomnia.  Current Outpatient Prescriptions on File Prior to Visit  Medication Sig Dispense Refill  . albuterol (PROVENTIL HFA;VENTOLIN HFA) 108 (90 BASE) MCG/ACT inhaler Inhale 2 puffs into the lungs every 6 (six) hours as needed for wheezing or shortness of breath. 1 Inhaler 1  . cyclobenzaprine (FLEXERIL) 10 MG tablet TAKE 1 TABLET BY MOUTH AT BEDTIME AS NEEDED MUSCLE SPASMS 30 tablet 5  . estrogens, conjugated, (PREMARIN) 0.45 MG tablet Take 0.45 mg by mouth daily. Take daily for 21 days then do not take for 7 days.    . lansoprazole (PREVACID) 30 MG capsule TAKE 1 CAPSULE BY MOUTH ONCE A DAY 90 capsule 1  . meclizine (ANTIVERT) 50 MG tablet Take 50 mg by mouth 3 (three) times daily as needed.    Marland Kitchen oxyCODONE-acetaminophen (PERCOCET/ROXICET) 5-325 MG tablet Take 1 tablet by mouth daily. 30 tablet 0  . simvastatin (ZOCOR) 10 MG tablet TAKE 1 TABLET (10 MG TOTAL) BY MOUTH AT BEDTIME. 90 tablet 3  . valACYclovir (VALTREX) 500 MG tablet TAKE 1 TABLET BY MOUTH TWICE A DAY AS NEEDED 60 tablet 0  . zolpidem (AMBIEN CR) 12.5 MG CR tablet TAKE 1 TABLET BY MOUTH AT BEDTIME AS NEEDED 30 tablet 0   No current facility-administered medications on file  prior to visit.    Allergies  Allergen Reactions  . Codeine     REACTION: GI upset  . Doxycycline Nausea And Vomiting    Past Medical History  Diagnosis Date  . Fibromyalgia     Dr. Corliss Skains  . Genital herpes   . Bulimia nervosa     history  . Bulging disc     T7/8-receiving ESI's (Dr. Charlett Blake)    Past Surgical History  Procedure Laterality Date  . Cesarean section    . Abdominal hysterectomy      endometriosis  . Rotator cuff repair    . Parotidectomy  2009    benign tumor    Family History  Problem Relation Age of Onset  . Lung cancer Mother   . Stroke Father   . Breast cancer Maternal Aunt 33    Social History   Social History  . Marital Status: Divorced    Spouse Name: N/A  . Number of Children: 1  . Years of Education: N/A   Occupational History  . IT Emergency planning/management officer    Social History Main Topics  . Smoking status: Never Smoker   . Smokeless tobacco: Never Used  . Alcohol Use: Yes     Comment: occasional  . Drug Use: No  . Sexual Activity: Not on file   Other Topics Concern  . Not on file   Social  History Narrative   Regular exercise-yes      Plays softball regularly      Has 1 daughter       [place]      IT Emergency planning/management officerproject manager for [employer]            The PMH, PSH, Social History, Family History, Medications, and allergies [facility], and have been updated if relevant.   Review of Systems  Constitutional: Negative.   HENT: Negative for tinnitus.   Respiratory: Negative.   Cardiovascular: Negative.   Gastrointestinal: Negative.   Endocrine: Positive for heat intolerance.  Musculoskeletal: Positive for myalgias and arthralgias. Negative for joint swelling.  Neurological: Positive for dizziness and headaches. Negative for tremors, seizures, syncope, facial asymmetry, speech difficulty, weakness, light-headedness and numbness.  Hematological: Negative.   Psychiatric/Behavioral: Negative.          Objective:    BP 134/78 mmHg  Pulse 99  Temp(Src) 97.9 F (36.6 C) (Oral)  Wt 175 lb 4 oz (79.493 kg)  SpO2 100%   Physical Exam  Constitutional: She is oriented to person, place, and time. She appears well-developed and well-nourished. No distress.  HENT:  Head: Normocephalic.  + Dix hallpike pos  Eyes: Conjunctivae are normal.  Cardiovascular: Normal rate.   Pulmonary/Chest: Effort normal.  Musculoskeletal: Normal range of motion. She exhibits no edema or tenderness.  Neurological: She is alert and oriented to person, place, and time. No cranial nerve deficit.  Skin: Skin is warm and dry.  Psychiatric: She has a normal mood and affect. Her behavior is normal. Judgment and thought content normal.  Nursing note and vitals reviewed.         Assessment & Plan:   Benign paroxysmal positional vertigo, unspecified laterality - Plan: Ambulatory referral to Physical Therapy  Arthritis - Plan: Sedimentation Rate, Rheumatoid Factor, Cyclic Citrul Peptide Antibody, IGG  Hot flashes - Plan: Luteinizing hormone, Follicle Stimulating Hormone No Follow-up on file.

## 2015-07-15 NOTE — Progress Notes (Signed)
Pre visit review using our clinic review tool, if applicable. No additional management support is needed unless otherwise documented below in the visit note. 

## 2015-07-15 NOTE — Telephone Encounter (Signed)
Pt in for OV. Ok to print per Dr Dayton MartesAron

## 2015-07-16 LAB — CYCLIC CITRUL PEPTIDE ANTIBODY, IGG: Cyclic Citrullin Peptide Ab: <16 Units

## 2015-07-16 LAB — LUTEINIZING HORMONE: LH: 2.26 m[IU]/mL

## 2015-07-16 LAB — RHEUMATOID FACTOR

## 2015-07-17 ENCOUNTER — Encounter: Payer: Self-pay | Admitting: Family Medicine

## 2015-07-23 ENCOUNTER — Ambulatory Visit: Payer: BLUE CROSS/BLUE SHIELD | Attending: Family Medicine | Admitting: Physical Therapy

## 2015-07-23 ENCOUNTER — Encounter: Payer: Self-pay | Admitting: Physical Therapy

## 2015-07-23 DIAGNOSIS — R42 Dizziness and giddiness: Secondary | ICD-10-CM | POA: Diagnosis not present

## 2015-07-23 NOTE — Therapy (Signed)
[facility] [address] [place], [place], [address] Phone: [phone]   Fax:  [phone]  Physical Therapy Evaluation  Patient Details  Name: Leonides CaveLaura M Hackert MRN: [id number] Date of Birth: [birth year] No Data Recorded  Encounter Date: 07/23/2015      PT End of Session - 07/23/15 1424    Visit Number 1   Number of Visits 8   Date for PT Re-Evaluation 09/17/15   PT Start Time 1305   PT Stop Time 1408   PT Time Calculation (min) 63 min   Equipment Utilized During Treatment Gait belt   Activity Tolerance Patient tolerated treatment well   Behavior During Therapy [facility] for tasks assessed/performed      Past Medical History  Diagnosis Date  . Fibromyalgia     Dr. Corliss Skainseveshwar  . Genital herpes   . Bulimia nervosa     history  . Bulging disc     T7/8-receiving ESI's (Dr. Charlett BlakeVoytek)    Past Surgical History  Procedure Laterality Date  . Cesarean section    . Abdominal hysterectomy      endometriosis  . Rotator cuff repair    . Parotidectomy  2009    benign tumor  . Neck surgery      There were no vitals filed for this visit.  Visit Diagnosis:  Dizziness and giddiness    VESTIBULAR AND BALANCE EVALUATION  Onset Date: 3 years ago dizziness started. Pt was getting episodes every 6 months that would last about a week and then get better.   HISTORY:  Subjective history of current problem: Pt reports that about 3 weeks began having another episode that has persisted and reports this is the longest time she has ever had an episode. Pt reports she has seen her primary care physician in regards to her symptoms and has not been seen by ENT or neurologist. Pt reports that she has been having difficulty driving at night with the oncoming headlights and this has caused "a bad episode of dizziness". Pt reports she has been given Meloxicam after episodes of dizziness and states that this helps reduce the dizziness and the  nauseousness but does not help the headache component. Pt reports she always gets a headache when she gets the dizziness when she gets bouts of vertigo. Pt reports she has a stressful job and gets frequent headaches. Pt denies migraines. Pt reports increase in her frequencies of headaches since onset of her vertigo. Pt states the headaches related to the dizziness feel different than her other prior headaches. Reports an "uneasy feeling in my head like you are on the verge of it happening again, like you are on the edge". Pt reports that Dr. Elmer SowAron's office she tipped her head back in sitting but did not do the full Dix-Hallpike test and patient states that Dr. Dayton MartesAron observed nystagmus and referred patient for PT.  Pt reports 9/10 is the worst her dizziness symptoms get and 1/10 is the best her symptoms get.  Description of dizziness: pt reports vertigo, unsteadiness, lightheadedness, general unsteadiness, aural fullness and ear aches at times.  Symptom nature: motion provoked,positional, variable, intermittent  Frequency: pt reports daily episodes of dizziness. Reports 2 bouts per day at least frequent and about 10 on the worst days.   Duration: vertigo lasts seconds and then headache and not feeling well for longer periods of time.   Provocative Factors: getting up first thing will bring on about 3 minutes of dizziness with  lightheadedness as well as vertigo. Pt states she always "lists" to the left. Walking down stairs, lights and headlights, quick head movements especially diagonal and tilting head back or bringing head back up from flexed position. Patterns on the ground make her dizzy without vertigo. Pt reports patterns increase her symptoms such as checkered floors.  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 independent community ambulator; active Auditory complaints (tinnitus, pain, drainage): reports intermittent tinnitus of chronic nature for several years. Pt reports she does not feel the tinnitus frequency has increased since the onset of this most recent bout of dizziness. Pt reports she might have noticed changes in her hearing. Can hear finger swipe test bilaterally Vision: (last eye exam, diplopia, recent changes) pt reports her vision seems more blurry (computer screen time a lot) states she wears her contacts all the time now whereas before she only wore for sports. States her vision can be blurry with the head being still.   Current Symptoms: (vertigo, N & V, dysarthria, dysphagia, drop attacks, bowel and bladder changes, recent weight loss/gain, lightheadedness, headache, rocking, general unsteadiness, imbalance, tilting, swaying, veering, dizzy, oscillopsia, migraines) Review of systems negative for red flags.   EXAMINATION  POSTURE: Upright sitting posture is good       COORDINATION: Finger to Nose: Normal Past Pointing:  Normal   MUSCULOSKELETAL SCREEN: Cervical Spine ROM: WNL AROM cerivcal flexion/extension and L/R rotation without pain. Pt does report tightness.   Gait: Pt ambulates without AD. Pt ambulates with fair cadence. Pt reports difficulty with descending steps due to dizziness symptoms.  Scanning of visual environment with gait is: fair  Balance: Pt demonstrates difficulty with ambulation with head turns. Pt reports dizziness with quick turns to the left.   OCULOMOTOR / VESTIBULAR TESTING: Note: Pt reports nausea symptoms during majority of vestibular testing as well as intermittent dizziness.  Oculomotor Exam- Room Light  Normal Abnormal Comments  Ocular Alignment N    Ocular ROM N    Spontaneous Nystagmus none    End-Gaze Nystagmus N    Smooth Pursuit N    Saccades N  Reports mild dizziness  with vertical saccades  VOR  Abn blurry  VOR Cancellation  Abn Dizziness from background images  Left Head Thrust   Deferred secondary to pt began to have a headache during testing  Right Head Thrust   Deferred secondary to pt began to have a headache during testing  Head Shaking Nystagmus   Right beating nystagmus noted about 10-20 seconds  Static Acuity   Deferred secondary to pt began to have a headache during testing  Dynamic Acuity   Deferred secondary to pt began to have a headache during testing   Note: It appeared that patient had horiz saccasdes or nystagmus when tracking objects during session but when did smooth pursuits testing, pt was able to follow target smoothly. Will plan on next visit to don frenzels and repeat smooth pursuits and observe for resting nystagmus.  BPPV TESTS:  Symptoms Duration Intensity Nystagmus  L Dix-Hallpike Dizziness without vertigo Less than 2 minutes 3-4/10 Appears to have geotrophic, horiz nystagmus  R Dix-Hallpike Dizziness without vertigo Less than 2 minutes 1-2/10   L Head Roll   denies none  R Head Roll  denies none    FUNCTIONAL OUTCOME MEASURES:  Results Comments  DHI 48 Moderate perception of impairment; in need of improvement  ABC Scale 76.8% Mild fall risk; in need of improvement  DGI 21/24 Mild impairment; in need of improvement       Subjective Assessment - 07/27/15 0945    Subjective Pt reports episodic dizziness with vertigo that has been occuring for the past 3 years since October 2013. Pt reports that about 3 weeks began having another episode that has persisted and reports this is the longest time she has ever had an episode. Pt reports she has seen only primary care physician in regards. Reports that she has having difficulty driving at night with the oncoming headlights and this caused a bad episode of dizziness. Pt reports they gave her meloxicam after episodes of dizziness and states that this helps reduce the dizziness and  the nauseousness but does not help the headache component. Pt reports she always gets a headache when she gets the dizziness when she gets bouts of vertigo. Pt reports she has a stressful job and gets frequent headaches. Pt denies migraines. Pt reports increase in her frequencies of headaches since onset of her vertigo. Pt states the headaches related to the dizziness feel different than her other prior headaches. Reports an "uneasy feeling in my head" like you are on the verge of it happening again like you are on the edge. Pt reports that Dr. Charlestine Night office she tipped her head back in sitting and MD observed nystagmus and referred Did not do Dix-Hallpike test or describe nystagmus.     Patient stated goals: to be able to drive at night, to be as active as she was normally (for example returning to her sports activities such as soft ball), and being able to focus better at work.      Plan - 07/25/15 1003    Clinical Impression Statement This is a 44 y/o F that presents with listed impairments, negative Dix-hallpike tests bilaterally but demonstrates difficulty with VOR and demonstrates right beating nystagmus post head shaking nystagmus testing which are potential indicators of peripheral vestibular loss that would beenfit from PT services to address deficits, goals and subjective symptoms.    Pt will benefit from skilled therapeutic intervention in order to improve on the following deficits Decreased balance;Dizziness;Difficulty walking   Rehab Potential Good   Clinical Impairments Affecting Rehab Potential Positive Indicators: motivated, age  Negative Indicators: chronicity   PT Frequency 1x / week   PT Duration 8 weeks   PT Treatment/Interventions Canalith Repostioning;Patient/family education;Neuromuscular re-education;Balance training;Therapeutic exercise;Gait training;Stair training;Vestibular   PT Next Visit Plan Frenzel lenses testing of smooth pursuits and resting nystagmus; firm and foam  feet together EO/EC testing; head thrust test; review VOR and progress vestibular exercises.   PT Home Exercise Plan VOR X 1 horiz   Consulted and Agree with Plan of Care Patient           PT Long Term Goals - 07/23/15 1528    PT LONG TERM GOAL #1   Title Patient will reduce perceived disability to low levels as indicated by <40 on Dizziness Handicap Inventory.   Baseline Pt scored 48 on 12/8.    Time 8   Period Weeks   PT LONG TERM GOAL #2   Title Patient will demonstrate reduced falls risk as evidenced by Dynamic Gait Index (DGI) 22/24 or greater.   Baseline Pt scored 21/24 on 12/8.    Time 8   Period  Weeks   PT LONG TERM GOAL #3   Title Patient will have demonstrate decreased falls risk as indicated by Activities Specific Balance Confidence Scale score of 80% or greater.   Baseline Pt scored 76.8% on 12/8.   Time 8   Period Weeks   PT LONG TERM GOAL #4   Title Patient reports no vertigo with provoking motions or positions.   Time 8   Period Weeks   PT LONG TERM GOAL #5   Title Patient will be able to perform home program independently for self-management.   Time 8   Period Weeks        Problem List Patient Active Problem List   Diagnosis Date Noted  . Benign paroxysmal positional vertigo 07/15/2015  . Arthritis 07/15/2015  . Hot flashes 07/15/2015  . Vertigo 09/21/2012  . Cervical pain (neck) 11/24/2011  . Fibromyalgia 04/07/2011  . HLD (hyperlipidemia) 05/05/2010  . GENITAL HERPES 01/22/2010  . INSOMNIA, CHRONIC 01/22/2010  . GERD 01/22/2010  . BULIMIA, HX OF 01/22/2010   Mardelle Matte PT, DPT  Mardelle Matte 07/23/2015, 2:29 PM  [facility] MAIN [facility] SERVICES [address] [place], [place], [address] Phone: [phone]   Fax:  [phone]  Name: JANNAT ROSEMEYER MRN: [id number] Date of Birth: [birth year]

## 2015-07-27 ENCOUNTER — Other Ambulatory Visit: Payer: Self-pay | Admitting: Family Medicine

## 2015-07-27 NOTE — Telephone Encounter (Signed)
Rx called in to requested pharmacy 

## 2015-07-27 NOTE — Telephone Encounter (Signed)
Last f/u 04/2015 

## 2015-07-30 ENCOUNTER — Encounter: Payer: Self-pay | Admitting: Physical Therapy

## 2015-07-30 ENCOUNTER — Ambulatory Visit: Payer: BLUE CROSS/BLUE SHIELD | Admitting: Physical Therapy

## 2015-07-30 DIAGNOSIS — R42 Dizziness and giddiness: Secondary | ICD-10-CM | POA: Diagnosis not present

## 2015-07-30 NOTE — Therapy (Signed)
[place] [facility] MAIN [facility] SERVICES [address] [place], [place], [address] Phone: [phone]   Fax:  [phone]  Physical Therapy Treatment  Patient Details  Name: [name] MRN: [id number] Date of Birth: [birth year] Referring Provider: Enos Fling  Encounter Date: 07/30/2015      PT End of Session - 07/31/15 1024    Visit Number 2   Number of Visits 8   Date for PT Re-Evaluation 09/17/15   PT Start Time 1358   PT Stop Time 1440   PT Time Calculation (min) 42 min   Equipment Utilized During Treatment Gait belt   Activity Tolerance Patient tolerated treatment well   Behavior During Therapy [facility] for tasks assessed/performed      Past Medical History  Diagnosis Date  . Fibromyalgia     Dr. Corliss Skains  . Genital herpes   . Bulimia nervosa     history  . Bulging disc     T7/8-receiving ESI's (Dr. Charlett Blake)    Past Surgical History  Procedure Laterality Date  . Cesarean section    . Abdominal hysterectomy      endometriosis  . Rotator cuff repair    . Parotidectomy  2009    benign tumor  . Neck surgery      There were no vitals filed for this visit.  Visit Diagnosis:  Dizziness and giddiness      Subjective Assessment - 07/30/15 1400    Subjective Pt states Monday and Tuesday by the end of the day she had a headache with "bad dizziness Tuesday night". Pt reports Wednesday she had no headache and felt good.  Pt reports she has been doing her VOR exercise and states it is not making her as dizzy as it was initially.    Patient Stated Goals to be able to drive at night, to be as active as she was normally (for example returning to her sports activities such as soft ball), and being able to focus better at work.      Neuromuscular Re-education: OCULOMOTOR / VESTIBULAR TESTING:  Oculomotor Exam- Room Light  Normal Abnormal Comments  Ocular Alignment N    Spontaneous Nystagmus N-absent    Static Acuity N  Line 7 Note:  testing was performed without patient's contacts as she was not wearing her contacts this date.  Dynamic Acuity  Abn Line 5; pt demonstrates a 2 line loss with testing    Oculomotor Exam- Fixation Suppressed  Normal Abnormal Comments  Ocular Alignment N    Spontaneous Nystagmus N-absent    End-Gaze Nystagmus N-absent      Clinical Test of Sensory Interaction for Balance    (CTSIB):  CONDITION TIME STRATEGY SWAY  Eyes open, firm surface 30 sec ankle   Eyes closed, firm surface 30 sec ankle +1  Eyes open, foam surface 30 sec ankle +1  Eyes closed, foam surface 30 sec ankle +2    Neuromuscular Re-education: Repeated Left and Right Dix-Hallpike tests as pt was reporting some mild nausea and dizziness with amb with vert head turns, but both tests were negative with no nystagmus observed and pt denied vertigo. Pt did state that in the right testing position she felt like the dizziness as about to come on but it did not and this sensation only lasted less than 10 seconds.     VOR exercise:  In standing on Airex pad, pt performed VOR X 1 horiz  1 rep of 30 seconds and then 2reps of 1  minute each one on firm surface and then two on Airex pad. Pt required one cue to try to decrease amount of head turn. Pt otherwise demonstrates good technique and eye tracking. Pt reports 2/10 dizziness with this activity.    Airex pad:  On Airex pad, performed feet together and semi-tandem progressions with alternate lead leg with and without horiz and vert head turns and body turns with CGA. Pt demonstrates several small LOB that she is able to self-correct.    Walking with head turns: Performed multiple 43' trials each of forwards and retro ambulation with and without vert and horiz head turns with CGA.   Newman Pies toss to self: Performed static ball toss to self horiz and vert while turning head and eyes to follow ball. Then, worked on 72' trials of forward ambulation while doing ball toss to self vert and  horiz.       PT Long Term Goals - 07/23/15 1528    PT LONG TERM GOAL #1   Title Patient will reduce perceived disability to low levels as indicated by <40 on Dizziness Handicap Inventory.   Baseline Pt scored 48 on 12/8.    Time 8   Period Weeks   PT LONG TERM GOAL #2   Title Patient will demonstrate reduced falls risk as evidenced by Dynamic Gait Index (DGI) 22/24 or greater.   Baseline Pt scored 21/24 on 12/8.    Time 8   Period Weeks   PT LONG TERM GOAL #3   Title Patient will have demonstrate decreased falls risk as indicated by Activities Specific Balance Confidence Scale score of 80% or greater.   Baseline Pt scored 76.8% on 12/8.   Time 8   Period Weeks   PT LONG TERM GOAL #4   Title Patient reports no vertigo with provoking motions or positions.   Time 8   Period Weeks   PT LONG TERM GOAL #5   Title Patient will be able to perform home program independently for self-management.   Time 8   Period Weeks               Plan - 07/31/15 1025    Clinical Impression Statement Pt reporting compliance with HEP and states that her symptoms have improved with practice of the VOR exercise. Pt demonstrates mild veering with ambulation with blal toss to self and ambulation with head turns this date and pt reports increase in dizziness with vert head turns. Pt able to progress VOR exercise from 30 seconds to 1 minute and from sitting to standing on Airex pad. Pt would benefit from continued PT services to work on progressions of exercises and to address goals.    Pt will benefit from skilled therapeutic intervention in order to improve on the following deficits Decreased balance;Dizziness;Difficulty walking   Rehab Potential Good   Clinical Impairments Affecting Rehab Potential Positive Indicators: motivated, age  Negative Indicators: chronicity   PT Frequency 1x / week   PT Duration 8 weeks   PT Treatment/Interventions Canalith Repostioning;Patient/family  education;Neuromuscular re-education;Balance training;Therapeutic exercise;Gait training;Stair training;Vestibular   PT Next Visit Plan Review HEP, continue working on progressions of HEP exercises; consider trying ambulation while scanning for targets, ambulation with ball toss over shoulder and hallway ball toss with quick turns next visit.    PT Home Exercise Plan Slides 1,2,5 and 15 from vestibular exercise packte which includes: VOR X 1 horiz, feet together and semi-tandem progressions with head turns and body turns and ambulation with head turns.  Consulted and Agree with Plan of Care Patient        Problem List Patient Active Problem List   Diagnosis Date Noted  . Benign paroxysmal positional vertigo 07/15/2015  . Arthritis 07/15/2015  . Hot flashes 07/15/2015  . Vertigo 09/21/2012  . Cervical pain (neck) 11/24/2011  . Fibromyalgia 04/07/2011  . HLD (hyperlipidemia) 05/05/2010  . GENITAL HERPES 01/22/2010  . INSOMNIA, CHRONIC 01/22/2010  . GERD 01/22/2010  . BULIMIA, HX OF 01/22/2010   Mardelle Matteorriea Artis Buechele PT, DPT Anahli Arvanitis 07/31/2015, 10:30 AM  [place] [facility] [address] [place], [place], [address] Phone: [phone]   Fax:  [phone]  Name: [name] MRN: [id number] Date of Birth: 03/11/1971

## 2015-08-04 ENCOUNTER — Encounter: Payer: Self-pay | Admitting: Physical Therapy

## 2015-08-04 ENCOUNTER — Ambulatory Visit: Payer: BLUE CROSS/BLUE SHIELD | Admitting: Physical Therapy

## 2015-08-04 DIAGNOSIS — R42 Dizziness and giddiness: Secondary | ICD-10-CM

## 2015-08-04 NOTE — Therapy (Signed)
[place] [facility] MAIN [facility] SERVICES [address] [place], [place], [address] Phone: [phone]   Fax:  [phone]  Physical Therapy Treatment  Patient Details  Name: [name] MRN: [id number] Date of Birth: October 12, 1970 Referring Provider: Enos Fling  Encounter Date: 08/04/2015      PT End of Session - 08/05/15 1148    Visit Number 3   Number of Visits 8   Date for PT Re-Evaluation 09/17/15   PT Start Time 1005   PT Stop Time 1045   PT Time Calculation (min) 40 min   Equipment Utilized During Treatment Gait belt   Activity Tolerance Patient tolerated treatment well   Behavior During Therapy [facility] for tasks assessed/performed      Past Medical History  Diagnosis Date  . Fibromyalgia     Dr. Corliss Skains  . Genital herpes   . Bulimia nervosa     history  . Bulging disc     T7/8-receiving ESI's (Dr. Charlett Blake)    Past Surgical History  Procedure Laterality Date  . Cesarean section    . Abdominal hysterectomy      endometriosis  . Rotator cuff repair    . Parotidectomy  2009    benign tumor  . Neck surgery      There were no vitals filed for this visit.  Visit Diagnosis:  Dizziness and giddiness      Subjective Assessment - 08/04/15 1010    Subjective Pt states she was not feeling well due to fibromyalgia this past week. Pt states she tried to remember her HEP but her papers got destroyed. Will reprint HEP handouts. Pt states she has noticed that she has been getting less headaches and states she feels her symptoms are starting to get better. Pt reports that she has not had any dizziness except during therapy sessions or doing her HEP.     Patient Stated Goals to be able to drive at night, to be as active as she was normally (for example returning to her sports activities such as soft ball), and being able to focus better at work.      Neuromuscular Re-education: VOR exercise: In standing on firm surface, pt performed VOR X 1  horiz 3 reps of 1 minute each with conflicting background. Pt reports 1/10 dizziness.   Hallway ball toss: In hallway, worked on ball toss against one wall with alternating quick turns to toss ball against opposite wall. Pt reports 3/10 dizziness with this activity.   Walking while scanning for visual targets: Performed 170' trials of forwards and retro ambulation while scanning for visual targets in hallway with CGA.   Bounce Passes: Performed ambulation 170' trials while doing alternating sides bounce passes to self with ball while tracking ball with eyes and head. 3/10 dizziness reported  Ball toss over shoulder: Pt ambulated 170' times two while tossing ball over one shoulder with return catch over opposite shoulder.   On Airex balance beam: Pt performed sidestepping L/R with and without horiz head turns and then sidestepping with mini-squats multiple reps of each type.      foam roll: On 1/2 foam roll with flat side down worked on static holds with and without horiz head turns for 3-5 minutes.          PT Education - 08/05/15 1145    Education provided Yes   Education Details Reprinted HEP and reviewed with patient. Added  ambulation with quick turns to HEP.    Person(s) Educated  Patient   Methods Explanation;Demonstration;Handout   Comprehension Verbalized understanding             PT Long Term Goals - 07/23/15 1528    PT LONG TERM GOAL #1   Title Patient will reduce perceived disability to low levels as indicated by <40 on Dizziness Handicap Inventory.   Baseline Pt scored 48 on 12/8.    Time 8   Period Weeks   PT LONG TERM GOAL #2   Title Patient will demonstrate reduced falls risk as evidenced by Dynamic Gait Index (DGI) 22/24 or greater.   Baseline Pt scored 21/24 on 12/8.    Time 8   Period Weeks   PT LONG TERM GOAL #3   Title Patient will have demonstrate decreased falls risk as indicated by Activities Specific Balance Confidence Scale score of 80% or  greater.   Baseline Pt scored 76.8% on 12/8.   Time 8   Period Weeks   PT LONG TERM GOAL #4   Title Patient reports no vertigo with provoking motions or positions.   Time 8   Period Weeks   PT LONG TERM GOAL #5   Title Patient will be able to perform home program independently for self-management.   Time 8   Period Weeks               Plan - 08/05/15 1149    Clinical Impression Statement Pt reporting decrease in intensity and frequency of her headaches and marked decrease in her dizziness overall. Pt able to work on progressions of VOR and vestibular and balance exercises this date. Pt demonstrating decreased veering with ambulation with head turns. Encouraged patient to continue to follow-up as scheduled  and to being doing HEP daily.     Pt will benefit from skilled therapeutic intervention in order to improve on the following deficits Decreased balance;Dizziness;Difficulty walking   Rehab Potential Good   Clinical Impairments Affecting Rehab Potential Positive Indicators: motivated, age  Negative Indicators: chronicity   PT Frequency 1x / week   PT Duration 8 weeks   PT Treatment/Interventions Canalith Repostioning;Patient/family education;Neuromuscular re-education;Balance training;Therapeutic exercise;Gait training;Stair training;Vestibular   PT Next Visit Plan Consider doing ambulation with ball toss over shoulder forward and retro and at varying heights and walking VOR next visit.    PT Home Exercise Plan Slides 1,2,5 and 15 from vestibular exercise packte which includes: VOR X 1 horiz, feet together and semi-tandem progressions with head turns and body turns and ambulation with head turns.   Consulted and Agree with Plan of Care Patient        Problem List Patient Active Problem List   Diagnosis Date Noted  . Benign paroxysmal positional vertigo 07/15/2015  . Arthritis 07/15/2015  . Hot flashes 07/15/2015  . Vertigo 09/21/2012  . Cervical pain (neck) 11/24/2011   . Fibromyalgia 04/07/2011  . HLD (hyperlipidemia) 05/05/2010  . GENITAL HERPES 01/22/2010  . INSOMNIA, CHRONIC 01/22/2010  . GERD 01/22/2010  . BULIMIA, HX OF 01/22/2010   Mardelle Matteorriea Ennis Heavner PT, DPT Cesareo Vickrey 08/05/2015, 12:00 PM  [place] [facility] [address] [place], [place], [address] Phone: [phone]   Fax:  [phone]  Name: [name] MRN: [id number] Date of Birth: Feb 26, 1971

## 2015-08-13 ENCOUNTER — Ambulatory Visit: Payer: BLUE CROSS/BLUE SHIELD | Admitting: Physical Therapy

## 2015-08-13 ENCOUNTER — Encounter: Payer: Self-pay | Admitting: Physical Therapy

## 2015-08-13 ENCOUNTER — Other Ambulatory Visit: Payer: Self-pay | Admitting: Family Medicine

## 2015-08-13 DIAGNOSIS — R42 Dizziness and giddiness: Secondary | ICD-10-CM

## 2015-08-13 NOTE — Therapy (Signed)
[place] [facility] MAIN [facility] SERVICES [address] [place], [place], [address] Phone: [phone]   Fax:  [phone]  Physical Therapy Treatment  Patient Details  Name: [name] MRN: [id number] Date of Birth: 09-19-1970 Referring Provider: Enos Fling  Encounter Date: 08/13/2015      PT End of Session - 08/13/15 1111    Visit Number 4   Number of Visits 8   Date for PT Re-Evaluation 09/17/15   PT Start Time 1003   PT Stop Time 1046   PT Time Calculation (min) 43 min   Equipment Utilized During Treatment Gait belt   Activity Tolerance Patient tolerated treatment well   Behavior During Therapy [facility] for tasks assessed/performed      Past Medical History  Diagnosis Date  . Fibromyalgia     Dr. Corliss Skains  . Genital herpes   . Bulimia nervosa     history  . Bulging disc     T7/8-receiving ESI's (Dr. Charlett Blake)    Past Surgical History  Procedure Laterality Date  . Cesarean section    . Abdominal hysterectomy      endometriosis  . Rotator cuff repair    . Parotidectomy  2009    benign tumor  . Neck surgery      There were no vitals filed for this visit.  Visit Diagnosis:  Dizziness and giddiness      Subjective Assessment - 08/13/15 1005    Subjective Pt reports that she has been doing much better. Pt reports she is not getting dizziness when doing the HEP now but is gettimg some mild headaches with this. Pt states she feels she can return to soft ball again . Pt states she has not tried driving at night but will plan on trying next week. Pt states that the frequency and intensity of her headaches has decreased.   Patient Stated Goals to be able to drive at night, to be as active as she was normally (for example returning to her sports activities such as soft ball), and being able to focus better at work.      Neuromuscular Re-education:  VOR exercise: Performed multiple trials of forward ambulation 35' while doing horiz  VOR X 1 viewing with conflicting background.  Newman Pies toss over shoulder: Pt performed multiple 150' trials of forward and retro ambulation while tossing ball over one shoulder with return catch over opposite shoulder at shoulder level and then with varying the ball position to head, shoulder and waist level to promote head turning and tilting.  Ball Sorting Activity: On firm surface, performed transferring multicolored balls from bin placed on floor to another bin placed 180 degrees on the other side of patient while patient visually tracks the ball so that patient is required to turn 180 degrees L and R to turn to bend over and pick and place balls in the bins with CGA. Pt with evidence of mild imbalance that patient was able to self-correct by touching with one or two fingers for a few seconds and patient reports increase in mild headache symptoms with this activity. Pt requires seated rest break after this activity to allow time for symptoms to decrease.   Airex pad:  On Airex pad, performed feet together EC with body turns side to side and then with random head turns as determined by the patient about 4-5 minutes total.   Card Sorting Activity:  Pt performed deck of card sorting activity on mat table sorting by numbers and  then turning to place cards on table placed behind and to the right side of patient to incorporate body turns and visual scanning of targets.        PT Education - 08/13/15 1110    Education provided Yes   Education Details Advanced HEP and reviewed with patient.   Person(s) Educated Patient   Methods Explanation;Demonstration;Handout   Comprehension Verbalized understanding;Returned demonstration             PT Long Term Goals - 07/23/15 1528    PT LONG TERM GOAL #1   Title Patient will reduce perceived disability to low levels as indicated by <40 on Dizziness Handicap Inventory.   Baseline Pt scored 48 on 12/8.    Time 8   Period Weeks   PT LONG TERM GOAL #2    Title Patient will demonstrate reduced falls risk as evidenced by Dynamic Gait Index (DGI) 22/24 or greater.   Baseline Pt scored 21/24 on 12/8.    Time 8   Period Weeks   PT LONG TERM GOAL #3   Title Patient will have demonstrate decreased falls risk as indicated by Activities Specific Balance Confidence Scale score of 80% or greater.   Baseline Pt scored 76.8% on 12/8.   Time 8   Period Weeks   PT LONG TERM GOAL #4   Title Patient reports no vertigo with provoking motions or positions.   Time 8   Period Weeks   PT LONG TERM GOAL #5   Title Patient will be able to perform home program independently for self-management.   Time 8   Period Weeks               Plan - 08/13/15 1111    Clinical Impression Statement Pt continues to report improvement in her overall symptoms and patient states she feels ready to resume her sports activities. Pt states she is planning on trying driving in low light environments with her boyfriend in the car so that she can switch drivers if needed. Pt did report that with activities this date it brought on a mild headache and  stated she was "not spinny but that feeling in my head" was recreated. Pt would benefit from continued PT services to try to further reduce her symptoms. Encouraged patient to follow-up as indicated. Will plan on return to clinic in 2 weeks with patient doing her advanced HEP dailly until that time.    Pt will benefit from skilled therapeutic intervention in order to improve on the following deficits Decreased balance;Dizziness;Difficulty walking   Rehab Potential Good   Clinical Impairments Affecting Rehab Potential Positive Indicators: motivated, age  Negative Indicators: chronicity   PT Frequency 1x / week   PT Duration 8 weeks   PT Treatment/Interventions Canalith Repostioning;Patient/family education;Neuromuscular re-education;Balance training;Therapeutic exercise;Gait training;Stair training;Vestibular   PT Next Visit Plan  Consider trying ball sorting from crates while standing on red foam mat next session and retesting funcitonal outcome measures.    PT Home Exercise Plan Slides 1,2,5 and 15 from vestibular exercise packte which includes: VOR X 1 horiz, feet together and semi-tandem progressions with head turns and body turns and ambulation with head turns. Added card sorting and ball sorting with quick turns and bending while tracking ball exercise as well as walking VOR with conflicting background to HEP.    Consulted and Agree with Plan of Care Patient        Problem List Patient Active Problem List   Diagnosis Date Noted  .  Benign paroxysmal positional vertigo 07/15/2015  . Arthritis 07/15/2015  . Hot flashes 07/15/2015  . Vertigo 09/21/2012  . Cervical pain (neck) 11/24/2011  . Fibromyalgia 04/07/2011  . HLD (hyperlipidemia) 05/05/2010  . GENITAL HERPES 01/22/2010  . INSOMNIA, CHRONIC 01/22/2010  . GERD 01/22/2010  . BULIMIA, HX OF 01/22/2010   Mardelle Matte PT, DPT  Baldomero Mirarchi 08/13/2015, 11:16 AM  [place] [facility] MAIN [facility] SERVICES [address] [place], [place], [address] Phone: [phone]   Fax:  [phone]  Name: [name] MRN: [id number] Date of Birth: Apr 19, 1971

## 2015-08-14 NOTE — Telephone Encounter (Signed)
Rx called in to requested pharmacy 

## 2015-08-14 NOTE — Telephone Encounter (Signed)
Last Rx printed at 06/2015 OV. pls advise

## 2015-08-21 ENCOUNTER — Encounter: Payer: BLUE CROSS/BLUE SHIELD | Admitting: Physical Therapy

## 2015-08-25 ENCOUNTER — Ambulatory Visit: Payer: BLUE CROSS/BLUE SHIELD | Admitting: Physical Therapy

## 2015-08-27 ENCOUNTER — Other Ambulatory Visit: Payer: Self-pay | Admitting: *Deleted

## 2015-08-27 MED ORDER — ZOLPIDEM TARTRATE ER 12.5 MG PO TBCR: 12.5000 mg | EXTENDED_RELEASE_TABLET | Freq: Every evening | ORAL | Status: DC | PRN

## 2015-08-27 NOTE — Telephone Encounter (Signed)
I do not see which medication she is asking for- please enter as refill request.

## 2015-08-27 NOTE — Telephone Encounter (Signed)
Last f/u 04/2015 

## 2015-08-28 NOTE — Telephone Encounter (Signed)
Rx called in to requested pharmacy 

## 2015-08-31 ENCOUNTER — Ambulatory Visit: Payer: BLUE CROSS/BLUE SHIELD | Attending: Family Medicine | Admitting: Physical Therapy

## 2015-08-31 ENCOUNTER — Other Ambulatory Visit: Payer: Self-pay

## 2015-08-31 MED ORDER — OXYCODONE-ACETAMINOPHEN 5-325 MG PO TABS: 1.0000 | ORAL_TABLET | Freq: Every day | ORAL | Status: DC

## 2015-08-31 NOTE — Telephone Encounter (Signed)
Pt left v/m requesting rx oxycodone apap. Call when ready for pick up. rx last printed # 30 on 07/01/15 and last seen 04/21/15.

## 2015-09-01 ENCOUNTER — Encounter: Payer: BLUE CROSS/BLUE SHIELD | Admitting: Physical Therapy

## 2015-09-01 MED ORDER — OXYCODONE-ACETAMINOPHEN 5-325 MG PO TABS: 1.0000 | ORAL_TABLET | Freq: Every day | ORAL | Status: DC

## 2015-09-01 NOTE — Telephone Encounter (Signed)
Rx printed. Was approved while Dr Dayton Martes out of office

## 2015-09-01 NOTE — Addendum Note (Signed)
Addended by: Desmond Dike on: 09/01/2015 08:11 AM   Modules accepted: Orders

## 2015-09-01 NOTE — Telephone Encounter (Signed)
Lm on pts vm informing her Rx is available for pickup from the front desk 

## 2015-09-21 ENCOUNTER — Other Ambulatory Visit: Payer: Self-pay | Admitting: *Deleted

## 2015-09-21 ENCOUNTER — Telehealth: Payer: Self-pay | Admitting: Family Medicine

## 2015-09-21 MED ORDER — CLONAZEPAM 0.5 MG PO TABS: 0.5000 mg | ORAL_TABLET | Freq: Every day | ORAL | Status: DC

## 2015-09-21 MED ORDER — LANSOPRAZOLE 30 MG PO CPDR: 30.0000 mg | DELAYED_RELEASE_CAPSULE | Freq: Every day | ORAL | Status: DC

## 2015-09-21 NOTE — Telephone Encounter (Signed)
Last f/u 06/2015 

## 2015-09-21 NOTE — Telephone Encounter (Signed)
See additional note. 

## 2015-09-21 NOTE — Telephone Encounter (Signed)
Rx called in to requested pharmacy 

## 2015-09-21 NOTE — Telephone Encounter (Signed)
Please enter as refill requests. 

## 2015-09-21 NOTE — Telephone Encounter (Signed)
Pt has requested refills on 2 medications.  Forms in Dr. Elmer Sow box.  clonazePAM (KLONOPIN) 0.5 MG tablet [[id number]]  And lansoprazole (PREVACID) 30 MG capsule.  Refill to [facility].  Patient is requesting callback due to leaving town and needs to pick up ASAP.  Best number is [phone]

## 2015-09-25 ENCOUNTER — Other Ambulatory Visit: Payer: Self-pay | Admitting: Family Medicine

## 2015-09-25 NOTE — Telephone Encounter (Signed)
Rx called in to requested pharmacy 

## 2015-09-25 NOTE — Telephone Encounter (Signed)
Last f/u 04/2015 

## 2015-10-16 ENCOUNTER — Other Ambulatory Visit: Payer: Self-pay | Admitting: Family Medicine

## 2015-10-19 NOTE — Telephone Encounter (Signed)
Last f/u 04/2015 

## 2015-10-19 NOTE — Telephone Encounter (Signed)
Rx called in to requested pharmacy 

## 2015-10-24 ENCOUNTER — Other Ambulatory Visit: Payer: Self-pay | Admitting: Family Medicine

## 2015-10-27 ENCOUNTER — Other Ambulatory Visit: Payer: Self-pay | Admitting: Family Medicine

## 2015-10-27 NOTE — Telephone Encounter (Signed)
Last f/u 04/2015 

## 2015-10-27 NOTE — Telephone Encounter (Signed)
Rx called in to requested pharmacy 

## 2015-11-16 ENCOUNTER — Other Ambulatory Visit: Payer: Self-pay

## 2015-11-16 MED ORDER — OXYCODONE-ACETAMINOPHEN 5-325 MG PO TABS: 1.0000 | ORAL_TABLET | Freq: Every day | ORAL | Status: DC

## 2015-11-16 NOTE — Telephone Encounter (Signed)
Pt left v/m requesting rx oxycodone apap. Call when ready for pick up. Last printed # 30 on 09/01/15. Last seen for f/u 04/21/15.

## 2015-11-17 ENCOUNTER — Other Ambulatory Visit: Payer: Self-pay | Admitting: Family Medicine

## 2015-11-17 ENCOUNTER — Encounter: Payer: Self-pay | Admitting: Family Medicine

## 2015-11-17 NOTE — Telephone Encounter (Signed)
Spoke to pt and informed her Rx is available for pickup from the front desk. Pt advised third party unable to pickup 

## 2015-11-18 NOTE — Telephone Encounter (Signed)
Last f/u 04/2015 

## 2015-11-18 NOTE — Telephone Encounter (Signed)
Rx called in to requested pharmacy 

## 2015-11-29 ENCOUNTER — Other Ambulatory Visit: Payer: Self-pay | Admitting: Family Medicine

## 2015-11-30 NOTE — Telephone Encounter (Signed)
Last office visit 07/15/2015.  Last refilled 10/27/2015 for #30 with no refills. Ok to refill?

## 2015-11-30 NOTE — Telephone Encounter (Signed)
Zolpidem CR called into [facility].

## 2015-12-09 ENCOUNTER — Encounter: Payer: Self-pay | Admitting: Family Medicine

## 2015-12-17 ENCOUNTER — Other Ambulatory Visit: Payer: Self-pay | Admitting: Family Medicine

## 2015-12-18 NOTE — Telephone Encounter (Signed)
Last f/u 04/2015 

## 2015-12-18 NOTE — Telephone Encounter (Signed)
Rx called in to requested pharmacy 

## 2015-12-22 ENCOUNTER — Ambulatory Visit (INDEPENDENT_AMBULATORY_CARE_PROVIDER_SITE_OTHER)
Admission: RE | Admit: 2015-12-22 | Discharge: 2015-12-22 | Disposition: A | Discharge status: Home / Self Care | Payer: BLUE CROSS/BLUE SHIELD | Source: Ambulatory Visit | Attending: Family Medicine | Admitting: Family Medicine

## 2015-12-22 ENCOUNTER — Encounter: Payer: Self-pay | Admitting: Family Medicine

## 2015-12-22 ENCOUNTER — Ambulatory Visit (INDEPENDENT_AMBULATORY_CARE_PROVIDER_SITE_OTHER): Payer: BLUE CROSS/BLUE SHIELD | Admitting: Family Medicine

## 2015-12-22 VITALS — BP 114/72 | HR 62 | Temp 97.7°F | Wt 179.5 lb

## 2015-12-22 DIAGNOSIS — M79672 Pain in left foot: Secondary | ICD-10-CM | POA: Diagnosis not present

## 2015-12-22 NOTE — Assessment & Plan Note (Signed)
New- not consistent with plantar fascitis given history. ? Heel spur or even a stress fx. Will get xray. Refer to Dr. Patsy Lageropland pending xray results. The patient indicates understanding of these issues and agrees with the plan.

## 2015-12-22 NOTE — Progress Notes (Signed)
Subjective:   Patient ID: [name], female    DOB: October 30, 1970, 45 y.o.   MRN: [id number]  [name] is a pleasant 45 y.o. year old female who presents to clinic today with Foot Pain  on 12/22/2015  HPI:  Left heel pain- started several weeks ago.  No known injury and getting progressively worse with any weight bearing.  Symptoms get worse, not better as she has been walking on it.  No changes in foot wear.  Now even hurting without weight bearing.  She is very physically active.  Plays softball.  Current Outpatient Prescriptions on File Prior to Visit  Medication Sig Dispense Refill  . albuterol (PROVENTIL HFA;VENTOLIN HFA) 108 (90 BASE) MCG/ACT inhaler Inhale 2 puffs into the lungs every 6 (six) hours as needed for wheezing or shortness of breath. 1 Inhaler 1  . clonazePAM (KLONOPIN) 0.5 MG tablet TAKE 1 TABLET BY MOUTH AT BEDTIME 30 tablet 0  . cyclobenzaprine (FLEXERIL) 10 MG tablet TAKE 1 TABLET BY MOUTH AT BEDTIME AS NEEDED MUSCLE SPASMS 30 tablet 5  . estrogens, conjugated, (PREMARIN) 0.45 MG tablet Take 0.45 mg by mouth daily. Take daily for 21 days then do not take for 7 days.    . lansoprazole (PREVACID) 30 MG capsule Take 1 capsule (30 mg total) by mouth daily. 90 capsule 1  . meclizine (ANTIVERT) 50 MG tablet Take 50 mg by mouth 3 (three) times daily as needed.    . meloxicam (MOBIC) 7.5 MG tablet Take 7.5 mg by mouth daily.    Marland Kitchen. oxyCODONE-acetaminophen (PERCOCET/ROXICET) 5-325 MG tablet Take 1 tablet by mouth daily. 30 tablet 0  . simvastatin (ZOCOR) 10 MG tablet TAKE 1 TABLET (10 MG TOTAL) BY MOUTH AT BEDTIME. 90 tablet 3  . valACYclovir (VALTREX) 500 MG tablet TAKE 1 TABLET BY MOUTH TWICE A DAY AS NEEDED 60 tablet 0  . zolpidem (AMBIEN CR) 12.5 MG CR tablet TAKE 1 TABLET BY MOUTH AT BEDTIME 30 tablet 0   No current facility-administered medications on file prior to visit.    Allergies  Allergen Reactions  . Codeine     REACTION: GI upset  . Doxycycline  Nausea And Vomiting  . Neosporin [Neomycin-Bacitracin Zn-Polymyx]     Patient reported    Past Medical History  Diagnosis Date  . Fibromyalgia     Dr. Corliss Skainseveshwar  . Genital herpes   . Bulimia nervosa     history  . Bulging disc     T7/8-receiving ESI's (Dr. Charlett BlakeVoytek)    Past Surgical History  Procedure Laterality Date  . Cesarean section    . Abdominal hysterectomy      endometriosis  . Rotator cuff repair    . Parotidectomy  2009    benign tumor  . Neck surgery      Family History  Problem Relation Age of Onset  . Lung cancer Mother   . Stroke Father   . Breast cancer Maternal Aunt [phone]    Social History   Social History  . Marital Status: Divorced    Spouse Name: N/A  . Number of Children: 1  . Years of Education: N/A   Occupational History  . IT Emergency planning/management officerproject manager    Social History Main Topics  . Smoking status: Never Smoker   . Smokeless tobacco: Never Used  . Alcohol Use: Yes     Comment: occasional  . Drug Use: No  . Sexual Activity: Not on file   Other Topics Concern  .  Not on file   Social History Narrative   Regular exercise-yes      Plays softball regularly      Has 1 daughter       Lives in [facility]      IT Emergency planning/management officer for [employer]            The PMH, PSH, Social History, Family History, Medications, and allergies have been reviewed in [facility], and have been updated if relevant.   Review of Systems  Musculoskeletal: Positive for arthralgias.  All other systems reviewed and are negative.      Objective:    BP 114/72 mmHg  Pulse 62  Temp(Src) 97.7 F (36.5 C) (Oral)  Wt 179 lb 8 oz (81.421 kg)  SpO2 98%   Physical Exam  Constitutional: She is oriented to person, place, and time. She appears well-developed and well-nourished. No distress.  HENT:  Head: Normocephalic and atraumatic.  Eyes: Conjunctivae are normal.  Neck: Normal range of motion.  Cardiovascular: Normal rate.   Pulmonary/Chest: Effort normal.    Musculoskeletal:       Feet:  Neurological: She is alert and oriented to person, place, and time. No cranial nerve deficit.  Skin: Skin is warm and dry. She is not diaphoretic.  Psychiatric: She has a normal mood and affect. Her behavior is normal. Judgment and thought content normal.  Nursing note and vitals reviewed.         Assessment & Plan:   Pain of left heel - Plan: DG Foot Complete Left No Follow-up on file.

## 2015-12-22 NOTE — Progress Notes (Signed)
Pre visit review using our clinic review tool, if applicable. No additional management support is needed unless otherwise documented below in the visit note. 

## 2015-12-22 NOTE — Patient Instructions (Signed)
I will call you with results of your foot xray.

## 2015-12-24 ENCOUNTER — Other Ambulatory Visit: Payer: Self-pay | Admitting: Family Medicine

## 2015-12-24 ENCOUNTER — Ambulatory Visit (INDEPENDENT_AMBULATORY_CARE_PROVIDER_SITE_OTHER): Payer: BLUE CROSS/BLUE SHIELD | Admitting: Family Medicine

## 2015-12-24 ENCOUNTER — Ambulatory Visit: Payer: BLUE CROSS/BLUE SHIELD | Admitting: Family Medicine

## 2015-12-24 ENCOUNTER — Encounter: Payer: Self-pay | Admitting: Family Medicine

## 2015-12-24 VITALS — BP 100/70 | HR 83 | Temp 98.6°F | Ht 63.5 in | Wt 178.5 lb

## 2015-12-24 DIAGNOSIS — M722 Plantar fascial fibromatosis: Secondary | ICD-10-CM | POA: Diagnosis not present

## 2015-12-24 NOTE — Progress Notes (Signed)
Pre visit review using our clinic review tool, if applicable. No additional management support is needed unless otherwise documented below in the visit note. 

## 2015-12-24 NOTE — Progress Notes (Signed)
Dr. Karleen Hampshire T. Zaylin Pistilli, MD, CAQ Sports Medicine Primary Care and Sports Medicine [address] [place], [address] Phone: [phone] Fax: [phone]  12/24/2015  Patient: [name], MRN: [id number], DOB: 12-28-1970, 45 y.o.  Primary Physician:  Ruthe Mannan, MD   Chief Complaint  Patient presents with  . Heel Spurs    Left   Subjective:   This 45 y.o. female patient presents with a 1-2 month long history of heel pain. This is notable for worsening pain first thing in the morning when arising and standing after sitting.   On feet all weekend.  Fibro -  1 month.   Softball, walks some.  Krav in [place] - stopped.  Prior foot or ankle fractures: none Prior operations: none Orthotics or bracing: none Medications: none PT or home rehab: none Night splints: no Ice massage: no Ball massage: no  Metatarsal pain: no  The PMH, PSH, Social History, Family History, Medications, and allergies have been reviewed in [facility], and have been updated if relevant.  Patient Active Problem List   Diagnosis Date Noted  . Pain of left heel 12/22/2015  . Benign paroxysmal positional vertigo 07/15/2015  . Arthritis 07/15/2015  . Hot flashes 07/15/2015  . Vertigo 09/21/2012  . Cervical pain (neck) 11/24/2011  . Fibromyalgia 04/07/2011  . HLD (hyperlipidemia) 05/05/2010  . GENITAL HERPES 01/22/2010  . INSOMNIA, CHRONIC 01/22/2010  . GERD 01/22/2010  . BULIMIA, HX OF 01/22/2010    Past Medical History  Diagnosis Date  . Fibromyalgia     Dr. Corliss Skains  . Genital herpes   . Bulimia nervosa     history  . Bulging disc     T7/8-receiving ESI's (Dr. Charlett Blake)    Past Surgical History  Procedure Laterality Date  . Cesarean section    . Abdominal hysterectomy      endometriosis  . Rotator cuff repair    . Parotidectomy  2009    benign tumor  . Neck surgery      Social History   Social History  . Marital Status: Divorced    Spouse Name: N/A  . Number of  Children: 1  . Years of Education: N/A   Occupational History  . IT Emergency planning/management officer    Social History Main Topics  . Smoking status: Never Smoker   . Smokeless tobacco: Never Used  . Alcohol Use: Yes     Comment: occasional  . Drug Use: No  . Sexual Activity: Not on file   Other Topics Concern  . Not on file   Social History Narrative   Regular exercise-yes      Plays softball regularly      Has 1 daughter       L[facility] in [facility]      IT Emergency planning/management officer for [employer]             Family History  Problem Relation Age of Onset  . Lung cancer Mother   . Stroke Father   . Breast cancer Maternal Aunt 54    Allergies  Allergen Reactions  . Codeine     REACTION: GI upset  . Doxycycline Nausea And Vomiting  . Neosporin [Neomycin-Bacitracin Zn-Polymyx]     Patient reported    Medication list reviewed and updated in full in [facility].  GEN: No fevers, chills. Nontoxic. Primarily MSK c/o today. MSK: Detailed in the HPI GI: tolerating PO intake without difficulty Neuro: No numbness, parasthesias, or tingling associated. Otherwise the  pertinent positives of the ROS are noted above.   Objective:   Blood pressure 100/70, pulse 83, temperature 98.6 F (37 C), temperature source Oral, height 5' 3.5" (1.613 m), weight 178 lb 8 oz (80.967 kg).  GEN: Well-developed,well-nourished,in no acute distress; alert,appropriate and cooperative throughout examination HEENT: Normocephalic and atraumatic without obvious abnormalities. Ears, externally no deformities PULM: Breathing comfortably in no respiratory distress EXT: No clubbing, cyanosis, or edema PSYCH: Normally interactive. Cooperative during the interview. Pleasant. Friendly and conversant. Not anxious or depressed appearing. Normal, full affect.   Left foot Echymosis: no Edema: no ROM: full LE B Gait: heel toe, non-antalgic MT pain: no Callus pattern: none Lateral Mall: NT Medial Mall: NT Talus:  NT Navicular: NT Calcaneous: NT Metatarsals: NT 5th MT: NT Phalanges: NT Achilles: NT Plantar Fascia: tender, medial along PF. Pain with forced dorsi Fat Pad: NT Peroneals: NT Post Tib: NT Great Toe: Nml motion Ant Drawer: neg Other foot breakdown: none Long arch: preserved Transverse arch: preserved Hindfoot breakdown: none Sensation: intact  Dg Foot Complete Left  12/22/2015  CLINICAL DATA:  Pain for several weeks in the posterior foot region EXAM: LEFT FOOT - COMPLETE 3+ VIEW COMPARISON:  None. FINDINGS: Frontal, oblique, and lateral views obtained. There is no fracture or dislocation. Joint spaces appear normal. No erosive change. There is an inferior calcaneal spur. IMPRESSION: Inferior calcaneal spur. No fracture or dislocation. No appreciable joint space narrowing or erosion. Electronically Signed   By: Bretta Bang III M.D.   On: 12/22/2015 14:13    Assessment and Plan:   Plantar fasciitis, left  Anatomy reviewed. Stretching and rehab are critically important to the treatment of PF. Reviewed footwear. Rigid soles have been shown to help with PF. Reviewed rehab of stretching and calf raises.  Reviewed rehab from [employer]  Could benefit from a corticosteroid injection if conservative treatment fails.  Follow-up: if not improving in 6-8 weeks  Patient Instructions  Please read handouts on Plantar Fascitis.  STRETCHING and Strengthening program critically important.  Strengthening on foot and calf muscles as seen in handout. Calf raises, 2 legged, then 1 legged. Foot massage with tennis ball. Ice massage.  Towel Scrunches: get a towel or hand towel, use toes to pick up and scrunch up the towel.  Marble pick-ups, practice picking up marbles with toes and placing into a cup  NEEDS TO BE DONE EVERY DAY  Recommended over the counter insoles. (Spenco or Hapad)  A rigid shoe with good arch support helps: Dansko (great), Randel Pigg,  Merrell No easily bendable shoes.   Tuli's heel cups      Signed,  Toshiye Kever T. Nile Prisk, MD   Patient's Medications  New Prescriptions   No medications on file  Previous Medications   ALBUTEROL (PROVENTIL HFA;VENTOLIN HFA) 108 (90 BASE) MCG/ACT INHALER    Inhale 2 puffs into the lungs every 6 (six) hours as needed for wheezing or shortness of breath.   CLONAZEPAM (KLONOPIN) 0.5 MG TABLET    TAKE 1 TABLET BY MOUTH AT BEDTIME   CYCLOBENZAPRINE (FLEXERIL) 10 MG TABLET    TAKE 1 TABLET BY MOUTH AT BEDTIME AS NEEDED MUSCLE SPASMS   ESTROGENS, CONJUGATED, (PREMARIN) 0.45 MG TABLET    Take 0.45 mg by mouth daily. Take daily for 21 days then do not take for 7 days.   LANSOPRAZOLE (PREVACID) 30 MG CAPSULE    Take 1 capsule (30 mg total) by mouth daily.   MECLIZINE (ANTIVERT) 50 MG  TABLET    Take 50 mg by mouth 3 (three) times daily as needed.   MELOXICAM (MOBIC) 15 MG TABLET    Take 15 mg by mouth daily. with food   OXYCODONE-ACETAMINOPHEN (PERCOCET/ROXICET) 5-325 MG TABLET    Take 1 tablet by mouth daily.   SIMVASTATIN (ZOCOR) 10 MG TABLET    TAKE 1 TABLET (10 MG TOTAL) BY MOUTH AT BEDTIME.   VALACYCLOVIR (VALTREX) 500 MG TABLET    TAKE 1 TABLET BY MOUTH TWICE A DAY AS NEEDED   ZOLPIDEM (AMBIEN CR) 12.5 MG CR TABLET    TAKE 1 TABLET BY MOUTH AT BEDTIME  Modified Medications   No medications on file  Discontinued Medications   MELOXICAM (MOBIC) 7.5 MG TABLET    Take 7.5 mg by mouth daily.

## 2015-12-24 NOTE — Patient Instructions (Signed)

## 2015-12-29 NOTE — Telephone Encounter (Signed)
Pt left v/m requesting refill status for ambien; per DPR left v/m that med was called in to [facility] today at 1:05.

## 2015-12-29 NOTE — Telephone Encounter (Signed)
Last f/u 04/2015 

## 2015-12-29 NOTE — Telephone Encounter (Signed)
Rx called in to requested pharmacy 

## 2016-01-07 ENCOUNTER — Other Ambulatory Visit: Payer: Self-pay | Admitting: Family Medicine

## 2016-01-16 ENCOUNTER — Other Ambulatory Visit: Payer: Self-pay | Admitting: Family Medicine

## 2016-01-18 NOTE — Telephone Encounter (Signed)
Rx called in to requested pharmacy 

## 2016-01-18 NOTE — Telephone Encounter (Signed)
Last f/u 04/2015 

## 2016-01-26 ENCOUNTER — Other Ambulatory Visit: Payer: Self-pay | Admitting: Family Medicine

## 2016-01-26 NOTE — Telephone Encounter (Signed)
Last f/u 04/2015 

## 2016-01-26 NOTE — Telephone Encounter (Signed)
Rx called in to requested pharmacy 

## 2016-02-01 ENCOUNTER — Other Ambulatory Visit: Payer: Self-pay

## 2016-02-01 MED ORDER — OXYCODONE-ACETAMINOPHEN 5-325 MG PO TABS: 1.0000 | ORAL_TABLET | Freq: Every day | ORAL | Status: DC

## 2016-02-01 NOTE — Telephone Encounter (Signed)
Pt left vm requesting rx oxycodone apap. Call when ready for pick up. Last printed # 30 on 11/16/15. Last f/u for fibromyalgia on 04/21/15.Please advise.

## 2016-02-01 NOTE — Telephone Encounter (Signed)
Lm on pts vm and informed her Rx is available for pickup at the front desk 

## 2016-02-13 ENCOUNTER — Other Ambulatory Visit: Payer: Self-pay | Admitting: Family Medicine

## 2016-02-15 NOTE — Telephone Encounter (Signed)
Rx called in to requested pharmacy 

## 2016-02-15 NOTE — Telephone Encounter (Signed)
Last f/u 04/2015 

## 2016-02-17 ENCOUNTER — Other Ambulatory Visit: Payer: Self-pay | Admitting: Family Medicine

## 2016-02-26 ENCOUNTER — Other Ambulatory Visit: Payer: Self-pay | Admitting: Family Medicine

## 2016-02-26 NOTE — Telephone Encounter (Signed)
Rx called in to requested pharmacy 

## 2016-02-26 NOTE — Telephone Encounter (Signed)
Last f/u 04/2015 

## 2016-03-20 ENCOUNTER — Other Ambulatory Visit: Payer: Self-pay | Admitting: Family Medicine

## 2016-03-21 NOTE — Telephone Encounter (Signed)
Last f/u 04/2015 

## 2016-03-21 NOTE — Telephone Encounter (Signed)
Rx called in to requested pharmacy 

## 2016-03-27 ENCOUNTER — Other Ambulatory Visit: Payer: Self-pay | Admitting: Family Medicine

## 2016-04-01 ENCOUNTER — Other Ambulatory Visit: Payer: Self-pay | Admitting: Family Medicine

## 2016-04-04 ENCOUNTER — Other Ambulatory Visit: Payer: Self-pay

## 2016-04-04 MED ORDER — OXYCODONE-ACETAMINOPHEN 5-325 MG PO TABS: 1.0000 | ORAL_TABLET | Freq: Every day | ORAL | 0 refills | Status: DC

## 2016-04-04 MED ORDER — ZOLPIDEM TARTRATE ER 12.5 MG PO TBCR: 12.5000 mg | EXTENDED_RELEASE_TABLET | Freq: Every day | ORAL | 0 refills | Status: DC

## 2016-04-04 NOTE — Telephone Encounter (Signed)
Pt called to ck on status of zolpidem CR refill; [facility] with [facility] and she said the approval did not come back to her; so I called [facility] as [facility]; pt voiced understanding.

## 2016-04-04 NOTE — Telephone Encounter (Signed)
Pt picked up rx at front desk.

## 2016-04-04 NOTE — Telephone Encounter (Signed)
Pt came into office; pt is on her way out of town and request rx for oxycodone apap while pt is waiting. Last printed # 30 on 02/01/16 and last seen 12/22/15.Please advise.

## 2016-04-14 ENCOUNTER — Other Ambulatory Visit: Payer: Self-pay | Admitting: Family Medicine

## 2016-04-15 ENCOUNTER — Telehealth: Payer: Self-pay

## 2016-04-15 MED ORDER — CLONAZEPAM 0.5 MG PO TABS: 0.5000 mg | ORAL_TABLET | Freq: Every day | ORAL | 0 refills | Status: DC

## 2016-04-15 NOTE — Telephone Encounter (Signed)
Pt left v/m requesting [facility]am [facility] [place] [id number]; pt has relocated temporarily while looking for new home and [facility] [place] [id number].last refilled # 30 on 03/21/16; last f/u 04/21/2015.

## 2016-04-15 NOTE — Telephone Encounter (Signed)
Spoke to pt and informed her Rx must be called into [facility] as stated on her contract

## 2016-04-15 NOTE — Telephone Encounter (Signed)
Ok to phone in Clonazepam to be filled on or after 04/21/16

## 2016-04-15 NOTE — Telephone Encounter (Signed)
Please call pt when this has been called in [facility] number [phone]

## 2016-04-16 ENCOUNTER — Other Ambulatory Visit: Payer: Self-pay | Admitting: Family Medicine

## 2016-04-19 NOTE — Telephone Encounter (Signed)
Rx called in to requested pharmacy 

## 2016-04-28 ENCOUNTER — Ambulatory Visit (INDEPENDENT_AMBULATORY_CARE_PROVIDER_SITE_OTHER): Payer: BLUE CROSS/BLUE SHIELD | Admitting: Family Medicine

## 2016-04-28 ENCOUNTER — Encounter: Payer: Self-pay | Admitting: Family Medicine

## 2016-04-28 VITALS — BP 114/62 | HR 109 | Temp 98.1°F | Wt 171.5 lb

## 2016-04-28 DIAGNOSIS — Z0000 Encounter for general adult medical examination without abnormal findings: Secondary | ICD-10-CM

## 2016-04-28 DIAGNOSIS — M797 Fibromyalgia: Secondary | ICD-10-CM | POA: Diagnosis not present

## 2016-04-28 DIAGNOSIS — E785 Hyperlipidemia, unspecified: Secondary | ICD-10-CM

## 2016-04-28 DIAGNOSIS — G4700 Insomnia, unspecified: Secondary | ICD-10-CM | POA: Diagnosis not present

## 2016-04-28 DIAGNOSIS — Z01419 Encounter for gynecological examination (general) (routine) without abnormal findings: Secondary | ICD-10-CM | POA: Insufficient documentation

## 2016-04-28 LAB — CBC WITH DIFFERENTIAL/PLATELET
BASOS PCT: 0.2 % (ref 0.0–3.0)
Basophils Absolute: 0 10*3/uL (ref 0.0–0.1)
EOS ABS: 0.1 10*3/uL (ref 0.0–0.7)
Eosinophils Relative: 1.5 % (ref 0.0–5.0)
HEMATOCRIT: 41.1 % (ref 36.0–46.0)
Hemoglobin: 14.3 g/dL (ref 12.0–15.0)
LYMPHS PCT: 24.7 % (ref 12.0–46.0)
Lymphs Abs: 2.3 10*3/uL (ref 0.7–4.0)
MCHC: 34.7 g/dL (ref 30.0–36.0)
MCV: 90.8 fl (ref 78.0–100.0)
Monocytes Absolute: 0.6 10*3/uL (ref 0.1–1.0)
Monocytes Relative: 6.6 % (ref 3.0–12.0)
NEUTROS ABS: 6.2 10*3/uL (ref 1.4–7.7)
Neutrophils Relative %: 67 % (ref 43.0–77.0)
PLATELETS: 444 10*3/uL — AB (ref 150.0–400.0)
RBC: 4.53 Mil/uL (ref 3.87–5.11)
RDW: 12 % (ref 11.5–15.5)
WBC: 9.2 10*3/uL (ref 4.0–10.5)

## 2016-04-28 LAB — LIPID PANEL
CHOLESTEROL: 217 mg/dL — AB (ref 0–200)
HDL: 45.7 mg/dL (ref >39.00)
NonHDL: 170.89
Total CHOL/HDL Ratio: 5
Triglycerides: 202 mg/dL — ABNORMAL HIGH (ref 0.0–149.0)
VLDL: 40.4 mg/dL — ABNORMAL HIGH (ref 0.0–40.0)

## 2016-04-28 LAB — COMPREHENSIVE METABOLIC PANEL
ALBUMIN: 4.3 g/dL (ref 3.5–5.2)
ALT: 16 U/L (ref 0–35)
AST: 17 U/L (ref 0–37)
Alkaline Phosphatase: 69 U/L (ref 39–117)
BILIRUBIN TOTAL: 0.3 mg/dL (ref 0.2–1.2)
BUN: 13 mg/dL (ref 6–23)
CO2: 30 mEq/L (ref 19–32)
CREATININE: 0.82 mg/dL (ref 0.40–1.20)
Calcium: 9.5 mg/dL (ref 8.4–10.5)
Chloride: 105 mEq/L (ref 96–112)
GFR: 80.16 mL/min (ref >60.00)
Glucose, Bld: 103 mg/dL — ABNORMAL HIGH (ref 70–99)
Potassium: 3.8 mEq/L (ref 3.5–5.1)
Sodium: 140 mEq/L (ref 135–145)
TOTAL PROTEIN: 7.8 g/dL (ref 6.0–8.3)

## 2016-04-28 LAB — TSH: TSH: 0.65 u[IU]/mL (ref 0.35–4.50)

## 2016-04-28 LAB — LDL CHOLESTEROL, DIRECT: LDL DIRECT: 150 mg/dL

## 2016-04-28 MED ORDER — ZOLPIDEM TARTRATE ER 12.5 MG PO TBCR: 12.5000 mg | EXTENDED_RELEASE_TABLET | Freq: Every day | ORAL | 0 refills | Status: DC

## 2016-04-28 NOTE — Progress Notes (Signed)
Subjective:   Patient ID: [name], female    DOB: [birth year], 45 y.o.   MRN: [id number]  [name] is a pleasant 45 y.o. year old female who presents to clinic today with Annual Exam  and follow up of chronic medical conditions on 04/28/2016  HPI:  Remote h/o hysterectomy Has GYN Mammogram 12/16  Insomnia- Has been on ambien and klonipin for years.  Has failed every other OTC tx. She knows her body is dependent on it but cannot function without sleep. Has followed our controlled substances contract.   HLD- On zocor 10 mg daily.  She has muscle aches constantly due to fibromyalgia- this is no worse with zocor. Lab Results  Component Value Date   CHOL 204 (H) 04/21/2015   HDL 45.30 04/21/2015   LDLCALC 118 (H) 04/08/2014   LDLDIRECT 122.0 04/21/2015   TRIG 261.0 (H) 04/21/2015   CHOLHDL 5 04/21/2015   Lab Results  Component Value Date   ALT 11 04/21/2015   AST 14 04/21/2015   ALKPHOS 67 04/21/2015   BILITOT 0.3 04/21/2015   Fibromyalgia- Unfortunately symptoms have worsened now that she has gone back into the office full time.  Does take prn percocet but tries not to.   Patient Active Problem List   Diagnosis Date Noted  . Well woman exam 04/28/2016  . Arthritis 07/15/2015  . Hot flashes 07/15/2015  . Cervical pain (neck) 11/24/2011  . Fibromyalgia 04/07/2011  . HLD (hyperlipidemia) 05/05/2010  . GENITAL HERPES 01/22/2010  . INSOMNIA, CHRONIC 01/22/2010  . GERD 01/22/2010  . BULIMIA, HX OF 01/22/2010   Past Medical History:  Diagnosis Date  . Bulging disc    T7/8-receiving ESI's (Dr. Charlett Blake)  . Bulimia nervosa    history  . Fibromyalgia    Dr. Corliss Skains  . Genital herpes    Past Surgical History:  Procedure Laterality Date  . ABDOMINAL HYSTERECTOMY     endometriosis  . CESAREAN SECTION    . NECK SURGERY    . PAROTIDECTOMY  2009   benign tumor  . ROTATOR CUFF REPAIR     Social History  Substance Use Topics  . Smoking status: Never  Smoker  . Smokeless tobacco: Never Used  . Alcohol use Yes     Comment: occasional   Family History  Problem Relation Age of Onset  . Lung cancer Mother   . Stroke Father   . Breast cancer Maternal Aunt 54   Allergies  Allergen Reactions  . Codeine     REACTION: GI upset  . Doxycycline Nausea And Vomiting  . Neosporin [Neomycin-Bacitracin Zn-Polymyx]     Patient reported   Current Outpatient Prescriptions on File Prior to Visit  Medication Sig Dispense Refill  . albuterol (PROVENTIL HFA;VENTOLIN HFA) 108 (90 BASE) MCG/ACT inhaler Inhale 2 puffs into the lungs every 6 (six) hours as needed for wheezing or shortness of breath. 1 Inhaler 1  . clonazePAM (KLONOPIN) 0.5 MG tablet Take 1 tablet (0.5 mg total) by mouth at bedtime. 30 tablet 0  . cyclobenzaprine (FLEXERIL) 10 MG tablet TAKE 1 TABLET BY MOUTH AT BEDTIME AS NEEDED MUSCLE SPASMS 30 tablet 5  . estrogens, conjugated, (PREMARIN) 0.45 MG tablet Take 0.45 mg by mouth daily. Take daily for 21 days then do not take for 7 days.    . lansoprazole (PREVACID) 30 MG capsule TAKE 1 CAPSULE (30 MG TOTAL) BY MOUTH DAILY. 90 capsule 1  . meclizine (ANTIVERT) 50 MG tablet Take 50 mg by  mouth 3 (three) times daily as needed.    . meloxicam (MOBIC) 15 MG tablet Take 15 mg by mouth daily. with food  3  . oxyCODONE-acetaminophen (PERCOCET/ROXICET) 5-325 MG tablet Take 1 tablet by mouth daily. 30 tablet 0  . simvastatin (ZOCOR) 10 MG tablet TAKE 1 TABLET (10 MG TOTAL) BY MOUTH AT BEDTIME. OFFICE VISIT WITH LABS REQUIRED FOR ADDITIONAL REFILLS 30 tablet 0  . valACYclovir (VALTREX) 500 MG tablet TAKE 1 TABLET BY MOUTH TWICE A DAY AS NEEDED 60 tablet 0  . zolpidem (AMBIEN CR) 12.5 MG CR tablet Take 1 tablet (12.5 mg total) by mouth at bedtime. OFFICE VISIT REQUIRED FOR ADDITIONAL REFILLS 30 tablet 0   No current facility-administered medications on file prior to visit.    The PMH, PSH, Social History, Family History, Medications, and allergies have  been reviewed [facility], and have been updated if relevant.    Review of Systems  Constitutional: Negative.   Respiratory: Negative.   Cardiovascular: Negative.   Gastrointestinal: Negative.   Genitourinary: Negative.   Musculoskeletal: Positive for arthralgias and myalgias.  Skin: Negative.   Psychiatric/Behavioral: Negative.   All other systems reviewed and are negative.     Objective:    Pulse (!) 109   Temp 98.1 F (36.7 C) (Oral)   Wt 171 lb 8 oz (77.8 kg)   SpO2 96%   BMI 29.90 kg/m    Physical Exam  Constitutional: She is oriented to person, place, and time. She appears well-developed and well-nourished. No distress.  HENT:  Head: Normocephalic and atraumatic.  Eyes: Conjunctivae are normal.  Cardiovascular: Normal rate and regular rhythm.   Pulmonary/Chest: Effort normal.  Musculoskeletal: Normal range of motion. She exhibits no edema.  Neurological: She is oriented to person, place, and time. No cranial nerve deficit.  Skin: Skin is warm and dry.  Psychiatric: She has a normal mood and affect. Her behavior is normal. Judgment and thought content normal.  Nursing note and vitals reviewed.         Assessment & Plan:   HLD (hyperlipidemia)  Well woman exam  INSOMNIA, CHRONIC  Fibromyalgia No Follow-up on file.

## 2016-04-28 NOTE — Assessment & Plan Note (Signed)
Deteriorated unfortunately. She will talk with HR to see if she can work from home again and will keep me updated.

## 2016-04-28 NOTE — Assessment & Plan Note (Signed)
Continue current dose of zocor. Check labs today. 

## 2016-04-28 NOTE — Assessment & Plan Note (Signed)
Reviewed preventive care protocols, scheduled due services, and updated immunizations Discussed nutrition, exercise, diet, and healthy lifestyle.  Orders Placed This Encounter  Procedures  . CBC with Differential/Platelet  . Comprehensive metabolic panel  . Lipid panel  . TSH   Declining influenza vaccine

## 2016-04-28 NOTE — Assessment & Plan Note (Signed)
Ambien rx refilled today.

## 2016-05-10 ENCOUNTER — Other Ambulatory Visit: Payer: Self-pay | Admitting: Family Medicine

## 2016-05-13 ENCOUNTER — Encounter: Payer: Self-pay | Admitting: Family Medicine

## 2016-05-13 ENCOUNTER — Ambulatory Visit (INDEPENDENT_AMBULATORY_CARE_PROVIDER_SITE_OTHER): Payer: BLUE CROSS/BLUE SHIELD | Admitting: Family Medicine

## 2016-05-13 VITALS — BP 116/72 | HR 64 | Temp 98.4°F | Wt 170.5 lb

## 2016-05-13 DIAGNOSIS — B379 Candidiasis, unspecified: Secondary | ICD-10-CM | POA: Diagnosis not present

## 2016-05-13 DIAGNOSIS — R319 Hematuria, unspecified: Secondary | ICD-10-CM | POA: Diagnosis not present

## 2016-05-13 DIAGNOSIS — R300 Dysuria: Secondary | ICD-10-CM

## 2016-05-13 DIAGNOSIS — T3695XA Adverse effect of unspecified systemic antibiotic, initial encounter: Secondary | ICD-10-CM

## 2016-05-13 DIAGNOSIS — R3915 Urgency of urination: Secondary | ICD-10-CM | POA: Diagnosis not present

## 2016-05-13 LAB — POC URINALSYSI DIPSTICK (AUTOMATED)
Bilirubin, UA: NEGATIVE
Glucose, UA: NEGATIVE
KETONES UA: NEGATIVE
LEUKOCYTES UA: NEGATIVE
Nitrite, UA: NEGATIVE
PH UA: 6
PROTEIN UA: NEGATIVE
SPEC GRAV UA: 1.025
Urobilinogen, UA: 0.2

## 2016-05-13 MED ORDER — CEPHALEXIN 500 MG PO CAPS: 500.0000 mg | ORAL_CAPSULE | Freq: Three times a day (TID) | ORAL | 0 refills | Status: DC

## 2016-05-13 MED ORDER — FLUCONAZOLE 150 MG PO TABS: 150.0000 mg | ORAL_TABLET | Freq: Once | ORAL | 0 refills | Status: AC

## 2016-05-13 NOTE — Patient Instructions (Signed)

## 2016-05-13 NOTE — Progress Notes (Signed)
Pre visit review using our clinic review tool, if applicable. No additional management support is needed unless otherwise documented below in the visit note. 

## 2016-05-13 NOTE — Progress Notes (Signed)
   Subjective:    Patient ID: [name], female    DOB: 1970-09-05, 45 y.o.   MRN: [id number]  HPI This is a 45 yo female who presents today with 2-3 days of dysuria, urinary frequency. Some lower abdominal pressure, no new back pain. Has felt hot. Decreased appetite, no vomiting. A little vaginal discharge, not abnormal for her. No vaginal itching or burning.  Has moved to [place] for her job.    Past Medical History:  Diagnosis Date  . Bulging disc    T7/8-receiving ESI's (Dr. Charlett BlakeVoytek)  . Bulimia nervosa    history  . Fibromyalgia    Dr. Corliss Skainseveshwar  . Genital herpes    Past Surgical History:  Procedure Laterality Date  . ABDOMINAL HYSTERECTOMY     endometriosis  . CESAREAN SECTION    . NECK SURGERY    . PAROTIDECTOMY  2009   benign tumor  . ROTATOR CUFF REPAIR     Family History  Problem Relation Age of Onset  . Lung cancer Mother   . Stroke Father   . Breast cancer Maternal Aunt [phone]   Social History  Substance Use Topics  . Smoking status: Never Smoker  . Smokeless tobacco: Never Used  . Alcohol use Yes     Comment: occasional      Review of Systems Per HPI    Objective:   Physical Exam  Constitutional: She is oriented to person, place, and time. She appears well-developed and well-nourished. No distress.  HENT:  Head: Normocephalic and atraumatic.  Eyes: Conjunctivae are normal.  Cardiovascular: Normal rate, regular rhythm and normal heart sounds.   Pulmonary/Chest: Effort normal and breath sounds normal.  Abdominal: Soft. Bowel sounds are normal. There is no CVA tenderness.  Neurological: She is alert and oriented to person, place, and time.  Skin: Skin is warm and dry. She is not diaphoretic.  Psychiatric: She has a normal mood and affect. Her behavior is normal. Judgment and thought content normal.  Vitals reviewed.        BP 116/72   Pulse 64   Temp 98.4 F (36.9 C) (Oral)   Wt 170 lb 8 oz (77.3 kg)   BMI 29.73 kg/m  Wt Readings  from Last 3 Encounters:  05/13/16 170 lb 8 oz (77.3 kg)  04/28/16 171 lb 8 oz (77.8 kg)  12/24/15 178 lb 8 oz (81 kg)    Assessment & Plan:  1. Urinary urgency - urine dip with blood only, will send for culture and treat symtpoms - POCT Urinalysis Dipstick (Automated) - cephALEXin (KEFLEX) 500 MG capsule; Take 1 capsule (500 mg total) by mouth 3 (three) times daily.  Dispense: 21 capsule; Refill: 0  2. Dysuria - cephALEXin (KEFLEX) 500 MG capsule; Take 1 capsule (500 mg total) by mouth 3 (three) times daily.  Dispense: 21 capsule; Refill: 0  3. Hematuria - cephALEXin (KEFLEX) 500 MG capsule; Take 1 capsule (500 mg total) by mouth 3 (three) times daily.  Dispense: 21 capsule; Refill: 0  4. Antibiotic-induced yeast infection - fluconazole (DIFLUCAN) 150 MG tablet; Take 1 tablet (150 mg total) by mouth once. Repeat if needed  Dispense: 2 tablet; Refill: 0   Olean Reeeborah Solace Manwarren, FNP-BC  [facility], [facility]  05/13/2016 2:19 PM

## 2016-05-14 LAB — URINE CULTURE

## 2016-05-24 ENCOUNTER — Other Ambulatory Visit: Payer: Self-pay | Admitting: *Deleted

## 2016-05-24 MED ORDER — CLONAZEPAM 0.5 MG PO TABS: 0.5000 mg | ORAL_TABLET | Freq: Every day | ORAL | 0 refills | Status: DC

## 2016-05-24 MED ORDER — SIMVASTATIN 10 MG PO TABS: 10.0000 mg | ORAL_TABLET | Freq: Every day | ORAL | 1 refills | Status: DC

## 2016-05-24 NOTE — Telephone Encounter (Signed)
Last f/u 04/2016 

## 2016-05-24 NOTE — Telephone Encounter (Signed)
Rx called in to requested pharmacy 

## 2016-05-30 ENCOUNTER — Other Ambulatory Visit: Payer: Self-pay | Admitting: Family Medicine

## 2016-05-31 NOTE — Telephone Encounter (Signed)
Last f/u 04/2016-CPE 

## 2016-05-31 NOTE — Telephone Encounter (Signed)
Rx called in to requested pharmacy 

## 2016-06-19 ENCOUNTER — Other Ambulatory Visit: Payer: Self-pay | Admitting: Family Medicine

## 2016-06-21 NOTE — Telephone Encounter (Signed)
Last f/u 04/2016-CPE 

## 2016-06-21 NOTE — Telephone Encounter (Signed)
Rx called in to requested pharmacy 

## 2016-06-28 ENCOUNTER — Other Ambulatory Visit: Payer: Self-pay | Admitting: Family Medicine

## 2016-06-28 NOTE — Telephone Encounter (Signed)
Rx called in to requested pharmacy 

## 2016-06-28 NOTE — Telephone Encounter (Signed)
Last f/u 04/2016-CPE 

## 2016-07-18 ENCOUNTER — Other Ambulatory Visit: Payer: Self-pay | Admitting: Family Medicine

## 2016-07-19 NOTE — Telephone Encounter (Signed)
Rx called in to requested pharmacy 

## 2016-07-19 NOTE — Telephone Encounter (Signed)
Last f/u 04/2016-CPE 

## 2016-07-20 ENCOUNTER — Telehealth: Payer: Self-pay

## 2016-07-20 NOTE — Telephone Encounter (Signed)
I cannot send in rx for bacterial vaginosis without evaluating patient.  This would be poor medical care.  Please have her see someone in [place] or make an appointment to see us.

## 2016-07-20 NOTE — Telephone Encounter (Signed)
Pt left v/m; pt is presently in [place] due to work and [facility]. Pt does not think yeast infection; pt thinks bacterial infection. Pt request cb.

## 2016-07-20 NOTE — Telephone Encounter (Signed)
Spoke to pt and advised per Dr Dayton MartesAron. States she will attempt to be seen locally

## 2016-07-27 ENCOUNTER — Other Ambulatory Visit: Payer: Self-pay | Admitting: Family Medicine

## 2016-07-27 ENCOUNTER — Encounter: Payer: Self-pay | Admitting: Family Medicine

## 2016-07-27 MED ORDER — SERTRALINE HCL 50 MG PO TABS: ORAL_TABLET | ORAL | 3 refills | Status: DC

## 2016-07-28 NOTE — Telephone Encounter (Signed)
Last f/u 04/2016-CPE 

## 2016-07-28 NOTE — Telephone Encounter (Signed)
Rx called in to requested pharmacy 

## 2016-08-14 ENCOUNTER — Other Ambulatory Visit: Payer: Self-pay | Admitting: Family Medicine

## 2016-08-16 ENCOUNTER — Other Ambulatory Visit: Payer: Self-pay | Admitting: Family Medicine

## 2016-08-17 NOTE — Telephone Encounter (Signed)
Last f/u 04/2016 

## 2016-08-18 NOTE — Telephone Encounter (Signed)
Rx called in to requested pharmacy 

## 2016-08-22 ENCOUNTER — Other Ambulatory Visit: Payer: Self-pay | Admitting: Family Medicine

## 2016-08-26 ENCOUNTER — Other Ambulatory Visit: Payer: Self-pay | Admitting: Family Medicine

## 2016-08-26 NOTE — Telephone Encounter (Signed)
Rx called in to requested pharmacy 

## 2016-08-26 NOTE — Telephone Encounter (Signed)
Last f/u 04/2016-CPE 

## 2016-08-29 ENCOUNTER — Other Ambulatory Visit: Payer: Self-pay | Admitting: *Deleted

## 2016-08-29 MED ORDER — OXYCODONE-ACETAMINOPHEN 5-325 MG PO TABS: 1.0000 | ORAL_TABLET | Freq: Every day | ORAL | 0 refills | Status: DC

## 2016-08-29 NOTE — Telephone Encounter (Signed)
Lm on pts vm and informed her Rx is available for pickup from the front desk 

## 2016-08-29 NOTE — Telephone Encounter (Signed)
Patient left a voicemail stating that she is in town today only and wants to get a refill on her Percocet. Last refill 04/04/16 #30 Last office visit 05/13/16/acute

## 2016-09-19 ENCOUNTER — Other Ambulatory Visit: Payer: Self-pay | Admitting: Family Medicine

## 2016-09-19 NOTE — Telephone Encounter (Signed)
Rx called in to requested pharmacy 

## 2016-09-19 NOTE — Telephone Encounter (Signed)
Last f/u 04/2016 

## 2016-09-23 ENCOUNTER — Other Ambulatory Visit: Payer: Self-pay | Admitting: Family Medicine

## 2016-09-23 NOTE — Telephone Encounter (Signed)
Rx called in to requested pharmacy 

## 2016-09-23 NOTE — Telephone Encounter (Signed)
Last f/u 04/2016 

## 2016-09-26 ENCOUNTER — Other Ambulatory Visit: Payer: Self-pay | Admitting: Family Medicine

## 2016-09-28 ENCOUNTER — Other Ambulatory Visit: Payer: Self-pay | Admitting: Family Medicine

## 2016-10-15 ENCOUNTER — Other Ambulatory Visit: Payer: Self-pay | Admitting: Family Medicine

## 2016-10-17 NOTE — Telephone Encounter (Signed)
Left refill on voice mail at pharmacy  

## 2016-10-17 NOTE — Telephone Encounter (Signed)
Last filled 09-19-16 #30 Last OV CPE 04-28-16 NO Future OV

## 2016-10-20 ENCOUNTER — Other Ambulatory Visit: Payer: Self-pay | Admitting: Family Medicine

## 2016-10-21 NOTE — Telephone Encounter (Signed)
Zolpidem last filled 09-27-16 #30 Cyclobenzaprine last filled 09-02-16 #30  Last OV 04-28-16 CPE 05-13-16 Acute No Future OV

## 2016-10-21 NOTE — Telephone Encounter (Signed)
Zolpidem refill left on voice mail at pharmacy

## 2016-11-15 ENCOUNTER — Other Ambulatory Visit: Payer: Self-pay | Admitting: Family Medicine

## 2016-11-16 NOTE — Telephone Encounter (Signed)
Klonopin faxed to pharmacy

## 2016-11-17 ENCOUNTER — Other Ambulatory Visit: Payer: Self-pay | Admitting: Family Medicine

## 2016-11-17 NOTE — Telephone Encounter (Signed)
Last filled 10-21-16 #30 Last OV acute 05-13-16 No future ov

## 2016-11-18 NOTE — Telephone Encounter (Signed)
Pt said clonazepam [facility] and was supposed to be called to [facility]. I called and left v/m cancelling rx at [facility] and called [facility] and the lady that answered said pharmacist was doing 5 things now and to leave the message on the v/m and she would get it off so pt could pick up med today.Medication phoned to [facility] pharmacy as instructed.  Pt notified and voiced understanding.

## 2016-11-29 ENCOUNTER — Other Ambulatory Visit: Payer: Self-pay | Admitting: Family Medicine

## 2016-11-30 NOTE — Telephone Encounter (Signed)
Left refill on voice mail at pharmacy  

## 2016-11-30 NOTE — Telephone Encounter (Signed)
Last filled 10-26-16 #30 Last CPE 04-28-16 NO Future OV

## 2016-12-02 ENCOUNTER — Other Ambulatory Visit: Payer: Self-pay

## 2016-12-02 NOTE — Telephone Encounter (Signed)
I will print it Monday so it will have the appropriate date

## 2016-12-02 NOTE — Telephone Encounter (Signed)
OK to print out and put on my desk for signature.

## 2016-12-02 NOTE — Telephone Encounter (Signed)
Pt request rx oxycodone apap. Call when ready for pick up. Pt wants to pick up on 12/05/16. Last printed # 30 on 08/29/16. Last annual 04/28/16.

## 2016-12-05 ENCOUNTER — Encounter: Payer: Self-pay | Admitting: Family Medicine

## 2016-12-05 MED ORDER — OXYCODONE-ACETAMINOPHEN 5-325 MG PO TABS: 1.0000 | ORAL_TABLET | Freq: Every day | ORAL | 0 refills | Status: DC

## 2016-12-05 NOTE — Telephone Encounter (Signed)
Rx printed and given to Dr Dayton Martes

## 2016-12-05 NOTE — Telephone Encounter (Signed)
Spoke to pt. Rx up front ready for pickup 

## 2016-12-14 ENCOUNTER — Other Ambulatory Visit: Payer: Self-pay | Admitting: Family Medicine

## 2016-12-16 ENCOUNTER — Other Ambulatory Visit: Payer: Self-pay | Admitting: Family Medicine

## 2016-12-16 NOTE — Telephone Encounter (Signed)
Last filled 11-21-16 #30 Last OV Acute 05-13-16 No Future OV

## 2016-12-16 NOTE — Telephone Encounter (Signed)
Left refill on voice mail at pharmacy  

## 2016-12-19 ENCOUNTER — Other Ambulatory Visit: Payer: Self-pay | Admitting: Family Medicine

## 2016-12-21 ENCOUNTER — Other Ambulatory Visit: Payer: Self-pay | Admitting: Family Medicine

## 2016-12-26 ENCOUNTER — Other Ambulatory Visit: Payer: Self-pay | Admitting: Family Medicine

## 2016-12-27 NOTE — Telephone Encounter (Signed)
[id number] [place] - [address]: [phone]  

## 2016-12-27 NOTE — Telephone Encounter (Signed)
Last refill 11/30/16  #30, last OV 04/28/16

## 2017-01-11 ENCOUNTER — Other Ambulatory Visit: Payer: Self-pay | Admitting: Internal Medicine

## 2017-01-17 ENCOUNTER — Other Ambulatory Visit: Payer: Self-pay | Admitting: Family Medicine

## 2017-01-18 NOTE — Telephone Encounter (Signed)
Last refill 12/16/16, last OV 04/28/16

## 2017-01-18 NOTE — Telephone Encounter (Signed)
Faxed to  [id number] [place], [place] - [phone]  [address] [place] [id number]  Phone: [phone] Fax: [phone]

## 2017-01-22 ENCOUNTER — Other Ambulatory Visit: Payer: Self-pay | Admitting: Family Medicine

## 2017-01-23 NOTE — Telephone Encounter (Signed)
Last refill 12/27/16 Last OV 04/28/16 Ok to refill?

## 2017-01-23 NOTE — Telephone Encounter (Signed)
Faxed to  [id number] [place], [place] - [address]  [address] [place] [address]  Phone: [phone] Fax: [phone]    

## 2017-01-24 ENCOUNTER — Other Ambulatory Visit: Payer: Self-pay | Admitting: Family Medicine

## 2017-01-27 ENCOUNTER — Other Ambulatory Visit: Payer: Self-pay | Admitting: Family Medicine

## 2017-02-11 ENCOUNTER — Other Ambulatory Visit: Payer: Self-pay | Admitting: Family Medicine

## 2017-02-13 NOTE — Telephone Encounter (Signed)
Last refill 01/11/17 Last OV 04/28/16  Ok to refill?

## 2017-02-16 ENCOUNTER — Other Ambulatory Visit: Payer: Self-pay | Admitting: Family Medicine

## 2017-02-16 NOTE — Telephone Encounter (Signed)
[id number] [place], [place] - [address]: [phone]  

## 2017-02-16 NOTE — Telephone Encounter (Signed)
Last refill AMBIEN 01/23/17, KLONOPIN 01/18/17 Last OV 04/28/16 Ok to refill?

## 2017-02-21 ENCOUNTER — Other Ambulatory Visit: Payer: Self-pay | Admitting: Family Medicine

## 2017-03-10 ENCOUNTER — Ambulatory Visit: Payer: BLUE CROSS/BLUE SHIELD | Admitting: Cardiovascular Disease

## 2017-03-12 ENCOUNTER — Other Ambulatory Visit: Payer: Self-pay | Admitting: Family Medicine

## 2017-03-13 NOTE — Telephone Encounter (Signed)
Last office visit 05/13/2016 with D.Gessner for Urinary Urgency.  Last CPE 04/28/2016.  Last refilled 02/13/2017 for #30 with no refills.  Ok to refill?

## 2017-03-21 ENCOUNTER — Other Ambulatory Visit: Payer: Self-pay | Admitting: Family Medicine

## 2017-03-22 NOTE — Telephone Encounter (Signed)
Last refill 02/16/17 Last OV 04/28/16 Ok to refill?

## 2017-03-22 NOTE — Telephone Encounter (Signed)
Faxed to  [id number] [place], [place] - [phone]: [phone]

## 2017-04-13 ENCOUNTER — Other Ambulatory Visit: Payer: Self-pay | Admitting: Family Medicine

## 2017-04-14 NOTE — Telephone Encounter (Signed)
[id number] [place], [place] - [phone]: [phone]

## 2017-04-14 NOTE — Telephone Encounter (Signed)
Last refill 03/22/17 Last OV 04/28/16 Ok to refill?

## 2017-04-19 ENCOUNTER — Other Ambulatory Visit: Payer: Self-pay | Admitting: Family Medicine

## 2017-04-20 NOTE — Telephone Encounter (Signed)
KLONOPIN last filled on 04/14/17 Cyclobenzaprine 03/13/17  Zolpidem 03/22/17 Last OV 04/28/16

## 2017-04-21 ENCOUNTER — Other Ambulatory Visit: Payer: Self-pay

## 2017-04-21 NOTE — Telephone Encounter (Signed)
Pt left v/m requesting rx oxycodone apap; call when ready for pick up. Last printed # 30 on 12/05/16; last annual 04/28/16; no future appt scheduled;pt wants to pick up on 04/25/17.

## 2017-04-24 MED ORDER — OXYCODONE-ACETAMINOPHEN 5-325 MG PO TABS: 1.0000 | ORAL_TABLET | Freq: Every day | ORAL | 0 refills | Status: DC

## 2017-04-24 NOTE — Telephone Encounter (Signed)
LMOM for patient to collect Rx Percocet at the front office.

## 2017-04-25 DIAGNOSIS — R1011 Right upper quadrant pain: Secondary | ICD-10-CM | POA: Diagnosis not present

## 2017-04-25 DIAGNOSIS — R110 Nausea: Secondary | ICD-10-CM | POA: Diagnosis not present

## 2017-04-26 ENCOUNTER — Telehealth: Payer: Self-pay

## 2017-04-26 DIAGNOSIS — F331 Major depressive disorder, recurrent, moderate: Secondary | ICD-10-CM | POA: Diagnosis not present

## 2017-04-26 NOTE — Telephone Encounter (Signed)
PA for Oxycodone-Acetaminophen 5-325 mg initiated through [email or web address] Awaiting for approval

## 2017-04-27 ENCOUNTER — Other Ambulatory Visit: Payer: Self-pay | Admitting: Family Medicine

## 2017-04-27 NOTE — Telephone Encounter (Signed)
PA approved from 04/26/2017 through 04/26/2018.

## 2017-04-27 NOTE — Telephone Encounter (Signed)
Zolpidem CR 12.5 mg called into  [id number] [place], [place] - [phone] [phone] (Phone)

## 2017-04-28 DIAGNOSIS — R1011 Right upper quadrant pain: Secondary | ICD-10-CM | POA: Diagnosis not present

## 2017-04-28 DIAGNOSIS — R12 Heartburn: Secondary | ICD-10-CM | POA: Diagnosis not present

## 2017-05-04 ENCOUNTER — Other Ambulatory Visit: Payer: Self-pay | Admitting: Family Medicine

## 2017-05-04 DIAGNOSIS — R110 Nausea: Secondary | ICD-10-CM | POA: Diagnosis not present

## 2017-05-04 DIAGNOSIS — R12 Heartburn: Secondary | ICD-10-CM | POA: Diagnosis not present

## 2017-05-04 DIAGNOSIS — K219 Gastro-esophageal reflux disease without esophagitis: Secondary | ICD-10-CM | POA: Diagnosis not present

## 2017-05-04 DIAGNOSIS — R1011 Right upper quadrant pain: Secondary | ICD-10-CM | POA: Diagnosis not present

## 2017-05-10 DIAGNOSIS — F331 Major depressive disorder, recurrent, moderate: Secondary | ICD-10-CM | POA: Diagnosis not present

## 2017-05-17 ENCOUNTER — Other Ambulatory Visit: Payer: Self-pay | Admitting: Family Medicine

## 2017-05-17 DIAGNOSIS — F331 Major depressive disorder, recurrent, moderate: Secondary | ICD-10-CM | POA: Diagnosis not present

## 2017-05-18 NOTE — Telephone Encounter (Signed)
Clonazepam last refilled on 04/14/17  Flexeril and ambien last filled on 04/20/17 Last OV 05/13/16

## 2017-05-22 ENCOUNTER — Other Ambulatory Visit: Payer: Self-pay | Admitting: Family Medicine

## 2017-05-22 NOTE — Telephone Encounter (Signed)
[id number] [place], [place] - [address]: [phone]  

## 2017-05-23 ENCOUNTER — Other Ambulatory Visit: Payer: Self-pay | Admitting: Family Medicine

## 2017-05-23 NOTE — Telephone Encounter (Signed)
Last refill 12/20/16 Last OV 04/28/16 Ok to refill?  Left voicemail for patient to call to schedule a CPE so we can refill her medications.

## 2017-05-24 DIAGNOSIS — F331 Major depressive disorder, recurrent, moderate: Secondary | ICD-10-CM | POA: Diagnosis not present

## 2017-06-07 ENCOUNTER — Encounter: Payer: BLUE CROSS/BLUE SHIELD | Admitting: Family Medicine

## 2017-06-07 ENCOUNTER — Telehealth: Payer: Self-pay | Admitting: Family Medicine

## 2017-06-07 NOTE — Telephone Encounter (Signed)
Copied from CRM #[id number]. Topic: Quick Communication - Appointment Cancellation >> Jun 07, 2017  8:48 AM Guinevere FerrariMorris, Sharamare E, NT wrote: Patient called to cancel appointment scheduled for Pt. Called and said she forgot about appointment today 06/07/17 @9  am. Pt is going to reschedule.   Route to department's PEC pool. >> Jun 07, 2017  9:56 AM Patience MuscaIsley, Rena M, LPN wrote: Pt had a CPX scheduled today; do you want a no show charge?  No charge if she is planning on rescheduling.

## 2017-06-12 ENCOUNTER — Encounter: Payer: Self-pay | Admitting: Family Medicine

## 2017-06-12 ENCOUNTER — Ambulatory Visit (INDEPENDENT_AMBULATORY_CARE_PROVIDER_SITE_OTHER): Payer: BLUE CROSS/BLUE SHIELD | Admitting: Family Medicine

## 2017-06-12 VITALS — BP 118/74 | HR 87 | Temp 97.8°F | Ht 64.0 in | Wt 177.4 lb

## 2017-06-12 DIAGNOSIS — G4700 Insomnia, unspecified: Secondary | ICD-10-CM

## 2017-06-12 DIAGNOSIS — Z01419 Encounter for gynecological examination (general) (routine) without abnormal findings: Secondary | ICD-10-CM

## 2017-06-12 DIAGNOSIS — E785 Hyperlipidemia, unspecified: Secondary | ICD-10-CM | POA: Diagnosis not present

## 2017-06-12 DIAGNOSIS — M542 Cervicalgia: Secondary | ICD-10-CM

## 2017-06-12 DIAGNOSIS — M797 Fibromyalgia: Secondary | ICD-10-CM | POA: Diagnosis not present

## 2017-06-12 LAB — CBC WITH DIFFERENTIAL/PLATELET
BASOS ABS: 0 10*3/uL (ref 0.0–0.1)
BASOS PCT: 0.2 % (ref 0.0–3.0)
EOS ABS: 0.3 10*3/uL (ref 0.0–0.7)
Eosinophils Relative: 3.1 % (ref 0.0–5.0)
HCT: 39.6 % (ref 36.0–46.0)
HEMOGLOBIN: 13.5 g/dL (ref 12.0–15.0)
LYMPHS ABS: 2.3 10*3/uL (ref 0.7–4.0)
Lymphocytes Relative: 24.3 % (ref 12.0–46.0)
MCHC: 34 g/dL (ref 30.0–36.0)
MCV: 93.4 fl (ref 78.0–100.0)
MONO ABS: 0.6 10*3/uL (ref 0.1–1.0)
Monocytes Relative: 6.7 % (ref 3.0–12.0)
NEUTROS PCT: 65.7 % (ref 43.0–77.0)
Neutro Abs: 6.2 10*3/uL (ref 1.4–7.7)
Platelets: 422 10*3/uL — ABNORMAL HIGH (ref 150.0–400.0)
RBC: 4.24 Mil/uL (ref 3.87–5.11)
RDW: 12.4 % (ref 11.5–15.5)
WBC: 9.5 10*3/uL (ref 4.0–10.5)

## 2017-06-12 LAB — LIPID PANEL
Cholesterol: 194 mg/dL (ref 0–200)
HDL: 52.6 mg/dL (ref >39.00)
LDL CALC: 123 mg/dL — AB (ref 0–99)
NONHDL: 141.75
Total CHOL/HDL Ratio: 4
Triglycerides: 96 mg/dL (ref 0.0–149.0)
VLDL: 19.2 mg/dL (ref 0.0–40.0)

## 2017-06-12 LAB — COMPREHENSIVE METABOLIC PANEL
ALK PHOS: 61 U/L (ref 39–117)
ALT: 16 U/L (ref 0–35)
AST: 20 U/L (ref 0–37)
Albumin: 4.4 g/dL (ref 3.5–5.2)
BUN: 11 mg/dL (ref 6–23)
CO2: 29 mEq/L (ref 19–32)
CREATININE: 0.74 mg/dL (ref 0.40–1.20)
Calcium: 9.2 mg/dL (ref 8.4–10.5)
Chloride: 102 mEq/L (ref 96–112)
GFR: 89.79 mL/min (ref >60.00)
GLUCOSE: 85 mg/dL (ref 70–99)
POTASSIUM: 4.1 meq/L (ref 3.5–5.1)
SODIUM: 139 meq/L (ref 135–145)
TOTAL PROTEIN: 7.3 g/dL (ref 6.0–8.3)
Total Bilirubin: 0.5 mg/dL (ref 0.2–1.2)

## 2017-06-12 LAB — TSH: TSH: 0.56 u[IU]/mL (ref 0.35–4.50)

## 2017-06-12 MED ORDER — CYCLOBENZAPRINE HCL 10 MG PO TABS: ORAL_TABLET | ORAL | 3 refills | Status: DC

## 2017-06-12 MED ORDER — ALBUTEROL SULFATE HFA 108 (90 BASE) MCG/ACT IN AERS: 2.0000 | INHALATION_SPRAY | Freq: Four times a day (QID) | RESPIRATORY_TRACT | 1 refills | Status: DC | PRN

## 2017-06-12 MED ORDER — SERTRALINE HCL 50 MG PO TABS: 50.0000 mg | ORAL_TABLET | Freq: Every day | ORAL | 3 refills | Status: DC

## 2017-06-12 MED ORDER — VALACYCLOVIR HCL 500 MG PO TABS: 500.0000 mg | ORAL_TABLET | Freq: Two times a day (BID) | ORAL | 0 refills | Status: DC | PRN

## 2017-06-12 MED ORDER — CLONAZEPAM 0.5 MG PO TABS: ORAL_TABLET | ORAL | 0 refills | Status: DC

## 2017-06-12 MED ORDER — SIMVASTATIN 10 MG PO TABS: 10.0000 mg | ORAL_TABLET | Freq: Every day | ORAL | 1 refills | Status: DC

## 2017-06-12 MED ORDER — OXYCODONE-ACETAMINOPHEN 5-325 MG PO TABS: 1.0000 | ORAL_TABLET | Freq: Every day | ORAL | 0 refills | Status: DC

## 2017-06-12 MED ORDER — ZOLPIDEM TARTRATE ER 12.5 MG PO TBCR: 12.5000 mg | EXTENDED_RELEASE_TABLET | Freq: Every day | ORAL | 0 refills | Status: DC

## 2017-06-12 MED ORDER — LANSOPRAZOLE 30 MG PO CPDR: DELAYED_RELEASE_CAPSULE | ORAL | 2 refills | Status: DC

## 2017-06-12 NOTE — Assessment & Plan Note (Signed)
Reviewed preventive care protocols, scheduled due services, and updated immunizations Discussed nutrition, exercise, diet, and healthy lifestyle.  Orders Placed This Encounter  Procedures  . CBC with Differential/Platelet  . Comprehensive metabolic panel  . Lipid panel  . TSH     

## 2017-06-12 NOTE — Assessment & Plan Note (Signed)
Continue current rxs.  Check labs today. 

## 2017-06-12 NOTE — Assessment & Plan Note (Signed)
Continue current rxs. 

## 2017-06-12 NOTE — Patient Instructions (Signed)
Great to see you.  Happy Birthday!  We will call you with your lab results and you can view them online. 

## 2017-06-12 NOTE — Progress Notes (Signed)
Subjective:   Patient ID: [name], female    DOB: 1970-10-19, 46 y.o.   MRN: [id number]  [name] is a pleasant 46 y.o. year old female who presents to clinic today with Annual Exam (Patient is here today for a CPE.  She is currently fasting. )  and follow up of chronic medical conditions on 06/12/2017  HPI:  Remote h/o hysterectomy but she has gynecologist. Has GYN Mammogram 12/17  Insomnia- Has been on ambien and klonipin for years.  Has failed every other OTC tx. Has followed our controlled substances contract.   HLD- On zocor 10 mg daily.  She has muscle aches constantly due to fibromyalgia- this is no worse with zocor. Lab Results  Component Value Date   CHOL 217 (H) 04/28/2016   HDL 45.70 04/28/2016   LDLCALC 118 (H) 04/08/2014   LDLDIRECT 150.0 04/28/2016   TRIG 202.0 (H) 04/28/2016   CHOLHDL 5 04/28/2016   Lab Results  Component Value Date   ALT 16 04/28/2016   AST 17 04/28/2016   ALKPHOS 69 04/28/2016   BILITOT 0.3 04/28/2016   Fibromyalgia- Unfortunately symptoms have worsened now that she has gone back into the office full time.  Does take prn percocet but tries not to.   Patient Active Problem List   Diagnosis Date Noted  . Well woman exam 04/28/2016  . Arthritis 07/15/2015  . Hot flashes 07/15/2015  . Cervical pain (neck) 11/24/2011  . Fibromyalgia 04/07/2011  . HLD (hyperlipidemia) 05/05/2010  . GENITAL HERPES 01/22/2010  . INSOMNIA, CHRONIC 01/22/2010  . GERD 01/22/2010  . BULIMIA, HX OF 01/22/2010   Past Medical History:  Diagnosis Date  . Bulging disc    T7/8-receiving ESI's (Dr. Charlett Blake)  . Bulimia nervosa    history  . Fibromyalgia    Dr. Corliss Skains  . Genital herpes    Past Surgical History:  Procedure Laterality Date  . ABDOMINAL HYSTERECTOMY     endometriosis  . CESAREAN SECTION    . NECK SURGERY    . PAROTIDECTOMY  2009   benign tumor  . ROTATOR CUFF REPAIR     Social History  Substance Use Topics  . Smoking  status: Never Smoker  . Smokeless tobacco: Never Used  . Alcohol use Yes     Comment: occasional   Family History  Problem Relation Age of Onset  . Lung cancer Mother   . Stroke Father   . Breast cancer Maternal Aunt 54   Allergies  Allergen Reactions  . Codeine     REACTION: GI upset  . Doxycycline Nausea And Vomiting  . Neosporin [Neomycin-Bacitracin Zn-Polymyx]     Patient reported   Current Outpatient Prescriptions on File Prior to Visit  Medication Sig Dispense Refill  . albuterol (PROVENTIL HFA;VENTOLIN HFA) 108 (90 BASE) MCG/ACT inhaler Inhale 2 puffs into the lungs every 6 (six) hours as needed for wheezing or shortness of breath. 1 Inhaler 1  . clonazePAM (KLONOPIN) 0.5 MG tablet TAKE 1 TABLET BY MOUTH EVERY DAY IN THE EVENING 30 tablet 0  . cyclobenzaprine (FLEXERIL) 10 MG tablet TAKE 1 TABLET BY MOUTH AT BEDTIME AS NEEDED MUSCLE SPASMS 30 tablet 0  . lansoprazole (PREVACID) 30 MG capsule TAKE 1 CAPSULE (30 MG TOTAL) BY MOUTH DAILY. 90 capsule 2  . oxyCODONE-acetaminophen (PERCOCET/ROXICET) 5-325 MG tablet Take 1 tablet by mouth daily. 30 tablet 0  . sertraline (ZOLOFT) 50 MG tablet TAKE 1 TABLET BY MOUTH EVERY DAY 90 tablet 0  .  simvastatin (ZOCOR) 10 MG tablet TAKE 1 TABLET BY MOUTH AT BEDTIME 90 tablet 1  . valACYclovir (VALTREX) 500 MG tablet TAKE 1 TABLET BY MOUTH TWICE A DAY AS NEEDED 60 tablet 0  . zolpidem (AMBIEN CR) 12.5 MG CR tablet TAKE 1 TABLET BY MOUTH AT BEDTIME 30 tablet 0   No current facility-administered medications on file prior to visit.    The PMH, PSH, Social History, Family History, Medications, and allergies have been [facility], and have been updated if relevant.    Review of Systems  Constitutional: Negative.   Respiratory: Negative.   Cardiovascular: Negative.   Gastrointestinal: Negative.   Genitourinary: Negative.   Musculoskeletal: Positive for arthralgias and myalgias.  Skin: Negative.   Psychiatric/Behavioral: Negative.     All other systems reviewed and are negative.     Objective:    There were no vitals taken for this visit.   Physical Exam  Constitutional: She is oriented to person, place, and time. She appears well-developed and well-nourished. No distress.  HENT:  Head: Normocephalic and atraumatic.  Eyes: Conjunctivae are normal.  Cardiovascular: Normal rate and regular rhythm.   Pulmonary/Chest: Effort normal.  Musculoskeletal: Normal range of motion. She exhibits no edema.  Neurological: She is oriented to person, place, and time. No cranial nerve deficit.  Skin: Skin is warm and dry.  Psychiatric: She has a normal mood and affect. Her behavior is normal. Judgment and thought content normal.  Nursing note and vitals reviewed.         Assessment & Plan:   Well woman exam  Hyperlipidemia, unspecified hyperlipidemia type  Cervical pain (neck) No Follow-up on file.

## 2017-06-12 NOTE — Assessment & Plan Note (Signed)
Unfortunately having a flare right now.  "I know how to manage this."

## 2017-06-28 DIAGNOSIS — M67911 Unspecified disorder of synovium and tendon, right shoulder: Secondary | ICD-10-CM | POA: Diagnosis not present

## 2017-07-08 DIAGNOSIS — M25511 Pain in right shoulder: Secondary | ICD-10-CM | POA: Diagnosis not present

## 2017-07-10 ENCOUNTER — Other Ambulatory Visit: Payer: Self-pay | Admitting: Family Medicine

## 2017-07-12 DIAGNOSIS — M67911 Unspecified disorder of synovium and tendon, right shoulder: Secondary | ICD-10-CM | POA: Diagnosis not present

## 2017-07-14 ENCOUNTER — Other Ambulatory Visit: Payer: Self-pay | Admitting: Family Medicine

## 2017-07-20 ENCOUNTER — Other Ambulatory Visit: Payer: Self-pay | Admitting: Family Medicine

## 2017-07-21 ENCOUNTER — Telehealth: Payer: Self-pay | Admitting: Family Medicine

## 2017-07-21 NOTE — Telephone Encounter (Signed)
Copied from CRM [phone]. Topic: Quick Communication - Rx Refill/Question >> Jul 21, 2017 12:33 PM Stephannie LiSimmons, Donevin Sainsbury L, NT wrote: Has the patient contacted their pharmacy? yes  Agent: If no, request that the patient contact the pharmacy for the refill. Preferred Pharmacy (with phone number [facility] street name[facility]  [address]  [facility] [place] Agent: Please be advised that RX refills may take up to 3 business days. We ask that you follow-up with your pharmacy. Prescriptions for klonopin 0.5 mg  ,and ambien 12.5 mg , please call patient at  [phone] if there are any questions

## 2017-07-24 ENCOUNTER — Ambulatory Visit (HOSPITAL_COMMUNITY)
Admission: EM | Admit: 2017-07-24 | Discharge: 2017-07-24 | Disposition: A | Discharge status: Home / Self Care | Payer: BLUE CROSS/BLUE SHIELD | Attending: Family Medicine | Admitting: Family Medicine

## 2017-07-24 ENCOUNTER — Ambulatory Visit (INDEPENDENT_AMBULATORY_CARE_PROVIDER_SITE_OTHER): Payer: BLUE CROSS/BLUE SHIELD

## 2017-07-24 ENCOUNTER — Encounter (HOSPITAL_COMMUNITY): Payer: Self-pay | Admitting: Emergency Medicine

## 2017-07-24 DIAGNOSIS — M79672 Pain in left foot: Secondary | ICD-10-CM

## 2017-07-24 MED ORDER — PREDNISONE 20 MG PO TABS: ORAL_TABLET | ORAL | 0 refills | Status: DC

## 2017-07-24 MED ORDER — DICLOFENAC SODIUM 75 MG PO TBEC: 75.0000 mg | DELAYED_RELEASE_TABLET | Freq: Two times a day (BID) | ORAL | 0 refills | Status: DC

## 2017-07-24 NOTE — Discharge Instructions (Signed)
I am expecting that your pain will greatly diminished in the next 2 days.  If he continues to have pain, further evaluation will be necessary.  If that is the case, please follow-up with your primary care doctor or orthopedic specialist.

## 2017-07-24 NOTE — ED Provider Notes (Signed)
[facility]   [id number] 07/24/17 Arrival Time: 1234   SUBJECTIVE:  Leonides CaveLaura M Matin is a 46 y.o. female who presents to the urgent care with complaint of left foot pain with no injury. Pain started 2 days ago. H/O plantar fasciitis in that foot.  Pain worse with weight bearing.  Patient works from home on computer.  Past Medical History:  Diagnosis Date  . Bulging disc    T7/8-receiving ESI's (Dr. Charlett BlakeVoytek)  . Bulimia nervosa    history  . Fibromyalgia    Dr. Corliss Skainseveshwar  . Genital herpes    Family History  Problem Relation Age of Onset  . Lung cancer Mother   . Stroke Father   . Breast cancer Maternal Aunt 854   Social History   Socioeconomic History  . Marital status: Divorced    Spouse name: Not on file  . Number of children: 1  . Years of education: Not on file  . Highest education level: Not on file  Social Needs  . Financial resource strain: Not on file  . Food insecurity - worry: Not on file  . Food insecurity - inability: Not on file  . Transportation needs - medical: Not on file  . Transportation needs - non-medical: Not on file  Occupational History  . Occupation: Building surveyorT project manager  Tobacco Use  . Smoking status: Never Smoker  . Smokeless tobacco: Never Used  Substance and Sexual Activity  . Alcohol use: No    Frequency: Never    Comment: occasional  . Drug use: No  . Sexual activity: Not on file  Other Topics Concern  . Not on file  Social History Narrative   Regular exercise-yes      Plays softball regularly      Has 1 daughter       [place]      IT Emergency planning/management officerproject manager for [employer]            Current Meds  Medication Sig  . albuterol (PROVENTIL HFA;VENTOLIN HFA) 108 (90 Base) MCG/ACT inhaler Inhale 2 puffs into the lungs every 6 (six) hours as needed for wheezing or shortness of breath.  . clonazePAM (KLONOPIN) 0.5 MG tablet TAKE 1 TABLET BY MOUTH DAILY IN THE EVENING  . cyclobenzaprine (FLEXERIL) 10 MG tablet  TAKE 1 TABLET BY MOUTH AT BEDTIME AS NEEDED MUSCLE SPASMS  . lansoprazole (PREVACID) 30 MG capsule TAKE 1 CAPSULE (30 MG TOTAL) BY MOUTH DAILY.  Marland Kitchen. sertraline (ZOLOFT) 50 MG tablet Take 1 tablet (50 mg total) by mouth daily.  . simvastatin (ZOCOR) 10 MG tablet Take 1 tablet (10 mg total) by mouth at bedtime.  Marland Kitchen. zolpidem (AMBIEN CR) 12.5 MG CR tablet TAKE 1 TABLET BY MOUTH AT BEDTIME  . [DISCONTINUED] oxyCODONE-acetaminophen (PERCOCET/ROXICET) 5-325 MG tablet Take 1 tablet by mouth daily.   Allergies  Allergen Reactions  . Codeine     REACTION: GI upset  . Doxycycline Nausea And Vomiting  . Neosporin [Neomycin-Bacitracin Zn-Polymyx]     Patient reported      ROS: As per HPI, remainder of ROS negative.   OBJECTIVE:   Vitals:   07/24/17 1251  BP: 108/73  Pulse: 67  Resp: 16  Temp: 98.5 F (36.9 C)  TempSrc: Oral  SpO2: 100%  Weight: 170 lb (77.1 kg)  Height: 5\' 5"  (1.651 m)     General appearance: alert; no distress Eyes: PERRL; EOMI; conjunctiva normal HENT: normocephalic; atraumatic; TMs normal, canal normal, external ears normal  without trauma; nasal mucosa normal; oral mucosa normal Neck: supple Back: no CVA tenderness Extremities: no cyanosis or edema; symmetrical with no gross deformities; nontender.  Good pedal pulse Skin: warm and dry Neurologic: antalgic gait; grossly normal Psychological: alert and cooperative; normal mood and affect      Labs:  Results for orders placed or performed in visit on 06/12/17  CBC with Differential/Platelet  Result Value Ref Range   WBC 9.5 4.0 - 10.5 K/uL   RBC 4.24 3.87 - 5.11 Mil/uL   Hemoglobin 13.5 12.0 - 15.0 g/dL   HCT 16.139.6 09.636.0 - 04.546.0 %   MCV 93.4 78.0 - 100.0 fl   MCHC 34.0 30.0 - 36.0 g/dL   RDW 40.912.4 81.111.5 - 91.415.5 %   Platelets 422.0 (H) 150.0 - 400.0 K/uL   Neutrophils Relative % 65.7 43.0 - 77.0 %   Lymphocytes Relative 24.3 12.0 - 46.0 %   Monocytes Relative 6.7 3.0 - 12.0 %   Eosinophils Relative 3.1 0.0  - 5.0 %   Basophils Relative 0.2 0.0 - 3.0 %   Neutro Abs 6.2 1.4 - 7.7 K/uL   Lymphs Abs 2.3 0.7 - 4.0 K/uL   Monocytes Absolute 0.6 0.1 - 1.0 K/uL   Eosinophils Absolute 0.3 0.0 - 0.7 K/uL   Basophils Absolute 0.0 0.0 - 0.1 K/uL  Comprehensive metabolic panel  Result Value Ref Range   Sodium 139 135 - 145 mEq/L   Potassium 4.1 3.5 - 5.1 mEq/L   Chloride 102 96 - 112 mEq/L   CO2 29 19 - 32 mEq/L   Glucose, Bld 85 70 - 99 mg/dL   BUN 11 6 - 23 mg/dL   Creatinine, Ser 7.820.74 0.40 - 1.20 mg/dL   Total Bilirubin 0.5 0.2 - 1.2 mg/dL   Alkaline Phosphatase 61 39 - 117 U/L   AST 20 0 - 37 U/L   ALT 16 0 - 35 U/L   Total Protein 7.3 6.0 - 8.3 g/dL   Albumin 4.4 3.5 - 5.2 g/dL   Calcium 9.2 8.4 - 95.610.5 mg/dL   GFR 21.3089.79 >86.57>60.00 mL/min  Lipid panel  Result Value Ref Range   Cholesterol 194 0 - 200 mg/dL   Triglycerides 84.696.0 0.0 - 149.0 mg/dL   HDL 96.2952.60 >52.84>39.00 mg/dL   VLDL 13.219.2 0.0 - 44.040.0 mg/dL   LDL Cholesterol 102123 (H) 0 - 99 mg/dL   Total CHOL/HDL Ratio 4    NonHDL 141.75   TSH  Result Value Ref Range   TSH 0.56 0.35 - 4.50 uIU/mL    Labs Reviewed - No data to display  No results found.     ASSESSMENT & PLAN:  1. Foot pain, left     Meds ordered this encounter  Medications  . predniSONE (DELTASONE) 20 MG tablet    Sig: Two daily with food    Dispense:  6 tablet    Refill:  0  . diclofenac (VOLTAREN) 75 MG EC tablet    Sig: Take 1 tablet (75 mg total) by mouth 2 (two) times daily.    Dispense:  14 tablet    Refill:  0    Reviewed expectations re: course of current medical issues. Questions answered. Outlined signs and symptoms indicating need for more acute intervention. Patient verbalized understanding. After Visit Summary given.    Procedures:      Elvina SidleLauenstein, Yarel Rushlow, MD 07/24/17 1356

## 2017-07-24 NOTE — ED Triage Notes (Signed)
PT reports left foot pain with no injury. Pain started 2 days ago.

## 2017-07-25 NOTE — Telephone Encounter (Signed)
Please review for refill for Klonopin and Ambien.

## 2017-07-27 NOTE — Telephone Encounter (Signed)
Pt already P/U Rx's/thx dmf

## 2017-08-28 DIAGNOSIS — M25511 Pain in right shoulder: Secondary | ICD-10-CM | POA: Diagnosis not present

## 2017-09-06 DIAGNOSIS — M25511 Pain in right shoulder: Secondary | ICD-10-CM | POA: Diagnosis not present

## 2017-09-11 DIAGNOSIS — M7541 Impingement syndrome of right shoulder: Secondary | ICD-10-CM | POA: Diagnosis not present

## 2017-09-11 DIAGNOSIS — M25511 Pain in right shoulder: Secondary | ICD-10-CM | POA: Diagnosis not present

## 2017-09-17 ENCOUNTER — Other Ambulatory Visit: Payer: Self-pay | Admitting: Family Medicine

## 2017-09-27 ENCOUNTER — Telehealth: Payer: Self-pay | Admitting: Family Medicine

## 2017-09-27 NOTE — Telephone Encounter (Signed)
Copied from CRM [phone]. Topic: Quick Communication - Rx Refill/Question >> Sep 27, 2017  3:36 PM Landry MellowFoltz, Melissa J wrote: Medication: percocet    Has the patient contacted their pharmacy? No.   (Agent: If no, request that the patient contact the pharmacy for the refill.)   [facility]erred Pharm[facility] (with phone number [facility] [address] [place]    Agent: Please be advised that RX refills may take up to 3 business days. We ask that you follow-up with your pharmacy.

## 2017-09-28 ENCOUNTER — Telehealth: Payer: Self-pay | Admitting: Family Medicine

## 2017-09-28 MED ORDER — OXYCODONE-ACETAMINOPHEN 5-325 MG PO TABS: 1.0000 | ORAL_TABLET | Freq: Every day | ORAL | 0 refills | Status: DC | PRN

## 2017-09-28 NOTE — Telephone Encounter (Signed)
TA-Plz see pt req for Percocet/looks like it was on her med list until she went to ED on 12.10.18 and that Dr Annalisia Ingber/C'ed it/plz advise/also when would you like to have her return for OV? Thx dmf

## 2017-09-28 NOTE — Telephone Encounter (Signed)
Okay to refill one time.  Please place in my box for signature.

## 2017-09-28 NOTE — Telephone Encounter (Signed)
Printed 30d at [facility] desk for signature/thx dmf

## 2017-09-28 NOTE — Telephone Encounter (Signed)
Left message for pt. To call back about Percocet request. Medication is not on medication list and need to clarify why medication is needed.

## 2017-09-28 NOTE — Telephone Encounter (Signed)
Patient has fibromyalgia and she uses the pain medication needed- usually at change of seasons. She was last filled in October. She is requesting another prescription. [facility] [address].  Patient can be reached at [phone].

## 2017-10-02 NOTE — Telephone Encounter (Signed)
Pt aware that Rx is ready at front desk/she will P/U/thx dmf

## 2017-10-23 DIAGNOSIS — Z1231 Encounter for screening mammogram for malignant neoplasm of breast: Secondary | ICD-10-CM | POA: Diagnosis not present

## 2017-10-23 DIAGNOSIS — Z6830 Body mass index (BMI) 30.0-30.9, adult: Secondary | ICD-10-CM | POA: Diagnosis not present

## 2017-10-23 DIAGNOSIS — Z1389 Encounter for screening for other disorder: Secondary | ICD-10-CM | POA: Diagnosis not present

## 2017-10-23 DIAGNOSIS — Z130 Encounter for screening for diseases of the blood and blood-forming organs and certain disorders involving the immune mechanism: Secondary | ICD-10-CM | POA: Diagnosis not present

## 2017-10-23 DIAGNOSIS — Z01419 Encounter for gynecological examination (general) (routine) without abnormal findings: Secondary | ICD-10-CM | POA: Diagnosis not present

## 2017-11-02 ENCOUNTER — Other Ambulatory Visit: Payer: Self-pay

## 2017-11-02 MED ORDER — VALACYCLOVIR HCL 500 MG PO TABS: 500.0000 mg | ORAL_TABLET | Freq: Two times a day (BID) | ORAL | 1 refills | Status: AC | PRN

## 2017-11-30 ENCOUNTER — Ambulatory Visit (INDEPENDENT_AMBULATORY_CARE_PROVIDER_SITE_OTHER): Payer: BLUE CROSS/BLUE SHIELD | Admitting: Family Medicine

## 2017-11-30 ENCOUNTER — Encounter: Payer: Self-pay | Admitting: Family Medicine

## 2017-11-30 VITALS — BP 108/62 | HR 76 | Temp 98.7°F | Ht 64.0 in | Wt 185.0 lb

## 2017-11-30 DIAGNOSIS — M797 Fibromyalgia: Secondary | ICD-10-CM | POA: Diagnosis not present

## 2017-11-30 DIAGNOSIS — F1120 Opioid dependence, uncomplicated: Secondary | ICD-10-CM

## 2017-11-30 DIAGNOSIS — Z79891 Long term (current) use of opiate analgesic: Secondary | ICD-10-CM | POA: Insufficient documentation

## 2017-11-30 MED ORDER — OXYCODONE-ACETAMINOPHEN 5-325 MG PO TABS: 1.0000 | ORAL_TABLET | Freq: Every day | ORAL | 0 refills | Status: DC | PRN

## 2017-11-30 NOTE — Progress Notes (Signed)
Subjective:   Patient ID: [name], female    DOB: Dec 30, 1970, 47 y.o.   MRN: [id number]  [name] is a pleasant 47 y.o. year old female who presents to clinic today with Follow-up (Patient is here today for a F/U with her medications.  She feels that she is tired but doing alright.  She currently takes Percodet 5-325mg  1qd prn severe pain for her Fibromyalgia.  She is UTD with UDS and next UDS & CSC due in October.  She will schedule a F/U for 3 months.  She is currently fasting.)  on 11/30/2017  HPI:  Fibromyalgia- Unfortunately symptoms have worsened now that she has gone back into the office full time, driving to and from [place].  Does take prn percocet but tries not to- averages once per week.  Indication for chronic opioid: fibromyalgia/chronic pain Medication and dose: percocet 5/325- 1 tab by mouth daily as needed for severe pain # pills per month: 30 Last UDS date: 05/2017 Opioid Treatment Agreement signed (Y/N): Y Opioid Treatment Agreement last reviewed with patient:  Y NCCSRS reviewed this encounter (include red flags):  Y- no red flags   Current Outpatient Medications on File Prior to Visit  Medication Sig Dispense Refill  . albuterol (PROVENTIL HFA;VENTOLIN HFA) 108 (90 Base) MCG/ACT inhaler Inhale 2 puffs into the lungs every 6 (six) hours as needed for wheezing or shortness of breath. 1 Inhaler 1  . clonazePAM (KLONOPIN) 0.5 MG tablet TAKE 1 TABLET BY MOUTH DAILY IN THE EVENING 30 tablet 4  . cyclobenzaprine (FLEXERIL) 10 MG tablet TAKE 1 TABLET BY MOUTH AT BEDTIME AS NEEDED MUSCLE SPASMS 30 tablet 3  . lansoprazole (PREVACID) 30 MG capsule TAKE 1 CAPSULE (30 MG TOTAL) BY MOUTH DAILY. 90 capsule 2  . sertraline (ZOLOFT) 50 MG tablet Take 1 tablet (50 mg total) by mouth daily. 90 tablet 3  . simvastatin (ZOCOR) 10 MG tablet Take 1 tablet (10 mg total) by mouth at bedtime. 90 tablet 1  . valACYclovir (VALTREX) 500 MG tablet Take 1 tablet (500 mg total) by  mouth 2 (two) times daily as needed. 180 tablet 1  . zolpidem (AMBIEN CR) 12.5 MG CR tablet TAKE 1 TABLET BY MOUTH AT BEDTIME 30 tablet 4   No current facility-administered medications on file prior to visit.     Allergies  Allergen Reactions  . Codeine     REACTION: GI upset  . Doxycycline Nausea And Vomiting  . Neosporin [Neomycin-Bacitracin Zn-Polymyx]     Patient reported    Past Medical History:  Diagnosis Date  . Bulging disc    T7/8-receiving ESI's (Dr. Charlett Blake)  . Bulimia nervosa    history  . Fibromyalgia    Dr. Corliss Skains  . Genital herpes     Past Surgical History:  Procedure Laterality Date  . ABDOMINAL HYSTERECTOMY     endometriosis  . CESAREAN SECTION    . NECK SURGERY    . PAROTIDECTOMY  2009   benign tumor  . ROTATOR CUFF REPAIR      Family History  Problem Relation Age of Onset  . Lung cancer Mother   . Stroke Father   . Breast cancer Maternal Aunt 3    Social History   Socioeconomic History  . Marital status: Divorced    Spouse name: Not on file  . Number of children: 1  . Years of education: Not on file  . Highest education level: Not on file  Occupational History  .  Occupation: Building surveyorT project manager  Social Needs  . Financial resource strain: Not on file  . Food insecurity:    Worry: Not on file    Inability: Not on file  . Transportation needs:    Medical: Not on file    Non-medical: Not on file  Tobacco Use  . Smoking status: Never Smoker  . Smokeless tobacco: Never Used  Substance and Sexual Activity  . Alcohol use: No    Frequency: Never    Comment: occasional  . Drug use: No  . Sexual activity: Not on file  Lifestyle  . Physical activity:    Days per week: Not on file    Minutes per session: Not on file  . Stress: Not on file  Relationships  . Social connections:    Talks on phone: Not on file    Gets together: Not on file    Attends religious service: Not on file    Active member of club or organization: Not on  file    Attends meetings of clubs or organizations: Not on file    Relationship status: Not on file  . Intimate partner violence:    Fear of current or ex partner: Not on file    Emotionally abused: Not on file    Physically abused: Not on file    Forced sexual activity: Not on file  Other Topics Concern  . Not on file  Social History Narrative   Regular exercise-yes      Plays softball regularly      Has 1 daughter       [place]      IT Emergency planning/management officerproject manager for [employer]            The PMH, PSH, Social History, Family History, Medications, and allergies [facility], and have been updated if relevant.   Review of Systems  Constitutional: Positive for fatigue.  Eyes: Negative.   Respiratory: Negative.   Cardiovascular: Negative.   Gastrointestinal: Negative.   Endocrine: Negative.   Musculoskeletal: Positive for arthralgias.  Neurological: Negative.   Hematological: Negative.   Psychiatric/Behavioral: Negative.   All other systems reviewed and are negative.      Objective:    BP 108/62 (BP Location: Left Arm, Patient Position: Sitting, Cuff Size: Normal)   Pulse 76   Temp 98.7 F (37.1 C) (Oral)   Ht 5\' 4"  (1.626 m)   Wt 185 lb (83.9 kg)   SpO2 95%   BMI 31.76 kg/m    Physical Exam  Constitutional: She is oriented to person, place, and time. She appears well-developed and well-nourished. No distress.  HENT:  Head: Normocephalic and atraumatic.  Eyes: EOM are normal.  Cardiovascular: Normal rate.  Pulmonary/Chest: Effort normal.  Musculoskeletal: Normal range of motion.  Neurological: She is alert and oriented to person, place, and time.  Skin: Skin is warm and dry. She is not diaphoretic.  Psychiatric: She has a normal mood and affect. Her behavior is normal. Judgment and thought content normal.  Nursing note and vitals reviewed.         Assessment & Plan:   Fibromyalgia  Opioid type dependence, continuous (HCC) No  follow-ups on file.

## 2017-11-30 NOTE — Patient Instructions (Signed)
Great to see you!   

## 2017-11-30 NOTE — Assessment & Plan Note (Signed)
Indication for chronic opioid: fibromyalgia/chronic pain Medication and dose: percocet 5/325- 1 tab by mouth daily as needed for severe pain # pills per month: 30 Last UDS date: 05/2017 Opioid Treatment Agreement signed (Y/N): Y Opioid Treatment Agreement last reviewed with patient:  Y NCCSRS reviewed this encounter (include red flags):  Y- no red flags  Rxs printed and given to pt today.

## 2017-12-18 ENCOUNTER — Other Ambulatory Visit: Payer: Self-pay | Admitting: Family Medicine

## 2018-02-13 ENCOUNTER — Other Ambulatory Visit: Payer: Self-pay | Admitting: Family Medicine

## 2018-03-01 ENCOUNTER — Ambulatory Visit: Payer: BLUE CROSS/BLUE SHIELD | Admitting: Family Medicine

## 2018-03-16 ENCOUNTER — Other Ambulatory Visit: Payer: Self-pay | Admitting: Family Medicine

## 2018-03-28 ENCOUNTER — Telehealth: Payer: Self-pay | Admitting: Nurse Practitioner

## 2018-03-28 ENCOUNTER — Other Ambulatory Visit: Payer: Self-pay | Admitting: Family Medicine

## 2018-03-28 NOTE — Telephone Encounter (Signed)
Relation to pt: self  Call back number: [phone] [id number] [place] [place] - [address] [phone] (Phone) [phone])     Reason for call:  Patient checking on the status of zolpidem (AMBIEN CR) 12.5 MG CR tablet request, informed patient request was received today from pharmacy, informed please allow 48 to 72 hour turn around time, patient states she's completely out and having difficulty sleeping, please advise

## 2018-03-28 NOTE — Telephone Encounter (Signed)
Spoke with pt and gave the orders. She is aware to get more refills from Dr. [place] [name] when she returns.

## 2018-03-28 NOTE — Telephone Encounter (Signed)
Requesting: Ambien Contract: Yes UDS: UTD Last OV: 4.18.19 Next OV: Due 10.23.18 and PMP ok Last Refill: 7.05.2019   Please advise

## 2018-04-03 ENCOUNTER — Telehealth: Payer: Self-pay | Admitting: Family Medicine

## 2018-04-03 NOTE — Telephone Encounter (Signed)
Copied from CRM [id number]. Topic: Quick Communication - Rx Refill/Question >> Apr 03, 2018  4:40 PM Mcneil, Ja-Kwan wrote: Medication: zolpidem (AMBIEN CR) 12.5 MG CR tablet Pt states a partial rx was sent while Dr Dayton MartesAron was out of the office. Pt request regular Rx refill  Has the patient contacted their pharmacy? no  Preferred Pharmacy (with phone number or street name): [id number] - [address] [phone] (Phone) [phone])  Agent: Please be advised that RX refills may take up to 3 business days. We ask that you follow-up with your pharmacy.

## 2018-04-03 NOTE — Telephone Encounter (Signed)
Ambien CR 12.5 mg refill  Last Refill:03/28/18 # 7 Last OV: 11/30/17 PCP: Dr. [place] MartesAron [id number]

## 2018-04-04 ENCOUNTER — Other Ambulatory Visit: Payer: Self-pay | Admitting: Nurse Practitioner

## 2018-04-04 MED ORDER — ZOLPIDEM TARTRATE ER 12.5 MG PO TBCR: 12.5000 mg | EXTENDED_RELEASE_TABLET | Freq: Every day | ORAL | 0 refills | Status: DC

## 2018-04-04 NOTE — Telephone Encounter (Signed)
[facility] Pt needs to keep an appt with Dr. Dayton MartesAron on 03/2018 for more refills. Rx faxed.

## 2018-04-04 NOTE — Telephone Encounter (Signed)
Already faxed in/thx dmf

## 2018-04-10 ENCOUNTER — Ambulatory Visit (INDEPENDENT_AMBULATORY_CARE_PROVIDER_SITE_OTHER): Payer: BLUE CROSS/BLUE SHIELD | Admitting: Family Medicine

## 2018-04-10 VITALS — BP 138/80 | HR 109 | Temp 98.7°F | Ht 64.0 in | Wt 192.0 lb

## 2018-04-10 DIAGNOSIS — R5383 Other fatigue: Secondary | ICD-10-CM

## 2018-04-10 DIAGNOSIS — F418 Other specified anxiety disorders: Secondary | ICD-10-CM | POA: Insufficient documentation

## 2018-04-10 DIAGNOSIS — F339 Major depressive disorder, recurrent, unspecified: Secondary | ICD-10-CM | POA: Diagnosis not present

## 2018-04-10 DIAGNOSIS — R232 Flushing: Secondary | ICD-10-CM

## 2018-04-10 DIAGNOSIS — M797 Fibromyalgia: Secondary | ICD-10-CM | POA: Diagnosis not present

## 2018-04-10 DIAGNOSIS — R002 Palpitations: Secondary | ICD-10-CM | POA: Insufficient documentation

## 2018-04-10 DIAGNOSIS — F1120 Opioid dependence, uncomplicated: Secondary | ICD-10-CM

## 2018-04-10 DIAGNOSIS — G4700 Insomnia, unspecified: Secondary | ICD-10-CM

## 2018-04-10 LAB — CBC
HCT: 41.4 % (ref 36.0–46.0)
HEMOGLOBIN: 14.2 g/dL (ref 12.0–15.0)
MCHC: 34.4 g/dL (ref 30.0–36.0)
MCV: 90.6 fl (ref 78.0–100.0)
PLATELETS: 419 10*3/uL — AB (ref 150.0–400.0)
RBC: 4.57 Mil/uL (ref 3.87–5.11)
RDW: 12.7 % (ref 11.5–15.5)
WBC: 8.7 10*3/uL (ref 4.0–10.5)

## 2018-04-10 LAB — LUTEINIZING HORMONE: LH: 3.99 m[IU]/mL

## 2018-04-10 LAB — FOLLICLE STIMULATING HORMONE: FSH: 3.6 m[IU]/mL

## 2018-04-10 LAB — VITAMIN D 25 HYDROXY (VIT D DEFICIENCY, FRACTURES): VITD: 24.62 ng/mL — ABNORMAL LOW (ref 30.00–100.00)

## 2018-04-10 LAB — TSH: TSH: 0.71 u[IU]/mL (ref 0.35–4.50)

## 2018-04-10 LAB — VITAMIN B12: Vitamin B-12: 279 pg/mL (ref 211–911)

## 2018-04-10 MED ORDER — ZOLPIDEM TARTRATE ER 12.5 MG PO TBCR: 12.5000 mg | EXTENDED_RELEASE_TABLET | Freq: Every day | ORAL | 0 refills | Status: DC

## 2018-04-10 MED ORDER — SERTRALINE HCL 100 MG PO TABS: 100.0000 mg | ORAL_TABLET | Freq: Every day | ORAL | 3 refills | Status: DC

## 2018-04-10 NOTE — Assessment & Plan Note (Signed)
Deteriorated, along with fatigue, hot flashes and depression. See "Depression" A and P.

## 2018-04-10 NOTE — Patient Instructions (Addendum)
Great to see you. We are increasing your zoloft to 100 mg daily. Please update me in a few eweks.   I will call you with your lab results from today and you can view them online.   We will call you with your cardiology referral.

## 2018-04-10 NOTE — Assessment & Plan Note (Signed)
Indication for chronic opioid: fibromyalgia/chronic pain Medication and dose: percocet 5/325- 1 tab by mouth daily as needed for severe pain # pills per month: 30 Last UDS date: 05/2017 Opioid Treatment Agreement signed (Y/N): Y Opioid Treatment Agreement last reviewed with patient:  Y NCCSRS reviewed this encounter (include red flags):  Y- no red flags  UDS and CCS updated today.

## 2018-04-10 NOTE — Assessment & Plan Note (Signed)
Deteriorated. Increase zoloft to 100 mg daily. She will update me in a few weeks with her symptoms.

## 2018-04-10 NOTE — Assessment & Plan Note (Signed)
Continue current rxs - ambien refilled today.

## 2018-04-10 NOTE — Assessment & Plan Note (Signed)
Intermittent but worsened in frequency over the past several months.  Palpitations and chest pressure do not seem to coincide and neither are aggravated by exertion and relieved by rest.  Palpitations due worsen when she lays on her left side. This may be due to more awareness of the sensation since the heart is closer to her left side than her right.  Given the duration of symptoms, will refer to cardiology for further evaluation- ? Holter monitor. Per pt, neg EKG in [place] recently and these symptoms have been intermittent for 6 months or more.  Will not repeat EKG today.

## 2018-04-10 NOTE — Assessment & Plan Note (Signed)
Deteriorated and likely multifactorial.  This is a common symptom of her fibromyalgia flares but she is also having increased depression, hot flashes. Increase zoloft to treat depression and hot flashes. Will also check labs today to rule out other possible contributing factors. The patient indicates understanding of these issues and agrees with the plan.

## 2018-04-10 NOTE — Progress Notes (Signed)
Subjective:   Patient ID: [name], female    DOB: 1971-06-08, 47 y.o.   MRN: [id number]  [name] is a pleasant [age] y.o. year old female who presents to clinic today with medication follow up (needs refills ambien) and Chest Pain (been having this chest tightness for months, SOB and pain ( but could be fibromyalgia))  on 04/10/2018  HPI:  Fibromyalgia- Unfortunately symptoms have worsened over the past couple of months.  Much more fatigued.    Indication for chronic opioid: fibromyalgia/chronic pain Medication and dose: percocet 5/325- 1 tab by mouth daily as needed for severe pain # pills per month: 30 Last UDS date: 05/2017 Opioid Treatment Agreement signed (Y/N): Y Opioid Treatment Agreement last reviewed with patient:  Y NCCSRS reviewed this encounter (include red flags):  Y- no red flags  Chest pain- Intermittent for several months.  She thinks that it is her fibromyalgia pain.  Had EKG done in [place] and it was normal.  She feels her heart flutter often, usually when she lays on her left side.  She is active- plays softball.  Chest pain and palpitations are not aggravated by exertion or relieved by rest.  Depression- feels this has worsened.  More tearful, more anhedonia.  Having hot flashes, questions if some of this is perimenopause.  Zoloft 50 mg daily was working well for her depression and hot flashes symptoms until the past couple of months. No SI or HI.  Current Outpatient Medications on File Prior to Visit  Medication Sig Dispense Refill  . celecoxib (CELEBREX) 200 MG capsule TAKE 1 CAPSULE BY MOUTH DAILY WITH FOOD    . albuterol (PROVENTIL HFA;VENTOLIN HFA) 108 (90 Base) MCG/ACT inhaler Inhale 2 puffs into the lungs every 6 (six) hours as needed for wheezing or shortness of breath. 1 Inhaler 1  . clonazePAM (KLONOPIN) 0.5 MG tablet TAKE ONE TABLET BY MOUTH IN THE EVENING 30 tablet 0  . cyclobenzaprine (FLEXERIL) 10 MG tablet TAKE 1 TABLET BY MOUTH AT  BEDTIME AS NEEDED MUSCLE SPASMS 30 tablet 0  . doxycycline (VIBRAMYCIN) 100 MG capsule doxycycline hyclate 100 mg capsule    . lansoprazole (PREVACID) 30 MG capsule TAKE 1 CAPSULE (30 MG TOTAL) BY MOUTH DAILY. 90 capsule 2  . meclizine (ANTIVERT) 25 MG tablet meclizine 25 mg tablet    . ondansetron (ZOFRAN) 4 MG tablet ondansetron HCl 4 mg tablet    . oxyCODONE-acetaminophen (PERCOCET) 5-325 MG tablet Take 1 tablet by mouth daily as needed for severe pain. 30 tablet 0  . pantoprazole (PROTONIX) 40 MG tablet pantoprazole 40 mg tablet,delayed release    . simvastatin (ZOCOR) 10 MG tablet Take 1 tablet (10 mg total) by mouth at bedtime. 90 tablet 1  . sulfamethoxazole-trimethoprim (BACTRIM DS,SEPTRA DS) 800-160 MG tablet sulfamethoxazole 800 mg-trimethoprim 160 mg tablet    . valACYclovir (VALTREX) 500 MG tablet Take 1 tablet (500 mg total) by mouth 2 (two) times daily as needed. 180 tablet 1   No current facility-administered medications on file prior to visit.     Allergies  Allergen Reactions  . Codeine     REACTION: GI upset  . Doxycycline Nausea And Vomiting  . Neosporin [Neomycin-Bacitracin Zn-Polymyx]     Patient reported    Past Medical History:  Diagnosis Date  . Bulging disc    T7/8-receiving ESI's (Dr. Charlett BlakeVoytek)  . Bulimia nervosa    history  . Fibromyalgia    Dr. Corliss Skainseveshwar  . Genital herpes  Past Surgical History:  Procedure Laterality Date  . ABDOMINAL HYSTERECTOMY     endometriosis  . CESAREAN SECTION    . NECK SURGERY    . PAROTIDECTOMY  2009   benign tumor  . ROTATOR CUFF REPAIR      Family History  Problem Relation Age of Onset  . Lung cancer Mother   . Stroke Father   . Breast cancer Maternal Aunt 62    Social History   Socioeconomic History  . Marital status: Divorced    Spouse name: Not on file  . Number of children: 1  . Years of education: Not on file  . Highest education level: Not on file  Occupational History  . Occupation: Careers information officer  Social Needs  . Financial resource strain: Not on file  . Food insecurity:    Worry: Not on file    Inability: Not on file  . Transportation needs:    Medical: Not on file    Non-medical: Not on file  Tobacco Use  . Smoking status: Never Smoker  . Smokeless tobacco: Never Used  Substance and Sexual Activity  . Alcohol use: No    Frequency: Never    Comment: occasional  . Drug use: No  . Sexual activity: Not on file  Lifestyle  . Physical activity:    Days per week: Not on file    Minutes per session: Not on file  . Stress: Not on file  Relationships  . Social connections:    Talks on phone: Not on file    Gets together: Not on file    Attends religious service: Not on file    Active member of club or organization: Not on file    Attends meetings of clubs or organizations: Not on file    Relationship status: Not on file  . Intimate partner violence:    Fear of current or ex partner: Not on file    Emotionally abused: Not on file    Physically abused: Not on file    Forced sexual activity: Not on file  Other Topics Concern  . Not on file  Social History Narrative   Regular exercise-yes      Plays softball regularly      Has 1 daughter       Lives in [facility]      IT Emergency planning/management officer for [employer]            The PMH, PSH, Social History, Family History, Medications, and allergies have been reviewed in [facility], and have been updated if relevant.   Review of Systems  Constitutional: Positive for fatigue.  Eyes: Negative.   Respiratory: Negative.   Cardiovascular: Positive for chest pain and palpitations.  Gastrointestinal: Negative.   Endocrine: Negative.   Musculoskeletal: Positive for arthralgias.  Skin: Negative.   Neurological: Negative.   Hematological: Negative.   Psychiatric/Behavioral: Positive for decreased concentration and dysphoric mood. Negative for self-injury and suicidal ideas. The patient is nervous/anxious.   All other  systems reviewed and are negative.      Objective:    BP 138/80 (BP Location: Left Arm, Patient Position: Sitting, Cuff Size: Normal)   Pulse (!) 109   Temp 98.7 F (37.1 C) (Oral)   Ht 5\' 4"  (1.626 m)   Wt 192 lb (87.1 kg)   SpO2 98%   BMI 32.96 kg/m    Physical Exam  Constitutional: She is oriented to person, place, and time. She appears well-developed and well-nourished.  No distress.  HENT:  Head: Normocephalic and atraumatic.  Eyes: EOM are normal.  Cardiovascular: Normal rate.  Pulmonary/Chest: Effort normal.  Musculoskeletal: Normal range of motion.  Neurological: She is alert and oriented to person, place, and time.  Skin: Skin is warm and dry. She is not diaphoretic.  Psychiatric: She has a normal mood and affect. Her behavior is normal. Judgment and thought content normal.  Tearful but appropriate  Nursing note and vitals reviewed.         Assessment & Plan:   Opioid type dependence, continuous (HCC) - Plan: Pain Mgmt, Profile 8 w/Conf, U  Fibromyalgia  INSOMNIA, CHRONIC  Depression, recurrent (HCC)  Other fatigue - Plan: Vitamin D (25 hydroxy), B12, CBC, TSH, LH, FSH  Intermittent palpitations - Plan: Ambulatory referral to Cardiology No follow-ups on file.

## 2018-04-11 LAB — PAIN MGMT, PROFILE 8 W/CONF, U
6 Acetylmorphine: NEGATIVE ng/mL (ref <10)
ALCOHOL METABOLITES: NEGATIVE ng/mL (ref <500)
Amphetamines: NEGATIVE ng/mL (ref <500)
Benzodiazepines: NEGATIVE ng/mL (ref <100)
Buprenorphine, Urine: NEGATIVE ng/mL (ref <5)
Cocaine Metabolite: NEGATIVE ng/mL (ref <150)
Creatinine: 47.4 mg/dL
MDMA: NEGATIVE ng/mL (ref <500)
Marijuana Metabolite: NEGATIVE ng/mL (ref <20)
OXIDANT: NEGATIVE ug/mL (ref <200)
OXYCODONE: NEGATIVE ng/mL (ref <100)
Opiates: NEGATIVE ng/mL (ref <100)
pH: 5.56 (ref 4.5–9.0)

## 2018-04-12 ENCOUNTER — Other Ambulatory Visit: Payer: Self-pay | Admitting: Family Medicine

## 2018-04-13 ENCOUNTER — Other Ambulatory Visit: Payer: Self-pay

## 2018-04-13 DIAGNOSIS — E559 Vitamin D deficiency, unspecified: Secondary | ICD-10-CM

## 2018-04-13 MED ORDER — VITAMIN D (ERGOCALCIFEROL) 1.25 MG (50000 UNIT) PO CAPS: 50000.0000 [IU] | ORAL_CAPSULE | ORAL | 0 refills | Status: DC

## 2018-04-18 ENCOUNTER — Other Ambulatory Visit: Payer: Self-pay | Admitting: Family Medicine

## 2018-04-19 NOTE — Telephone Encounter (Signed)
Requesting: Clonazepam 0.5mg  & Cyclobenzaprine Contract: Yes UDS: UTD Last OV: 8.27.19 Next OV: Not scheduled but will need in Feb 2020 Last Refill: 8.8.19 & 8.2.19   Please advise

## 2018-04-19 NOTE — Telephone Encounter (Signed)
Okay to refill both.  Just tried using my finger print thing from my home computer and it's not working.

## 2018-04-20 ENCOUNTER — Other Ambulatory Visit: Payer: Self-pay | Admitting: Family Medicine

## 2018-04-20 ENCOUNTER — Telehealth: Payer: Self-pay | Admitting: Family Medicine

## 2018-04-20 NOTE — Telephone Encounter (Signed)
Copied from CRM [id number]. Topic: Quick Communication - Rx Refill/Question >> Apr 20, 2018 12:36 PM Burchel, Abbi R wrote: Medication:  clonazePAM (KLONOPIN) 0.5 MG tablet               cyclobenzaprine (FLEXERIL) 10 MG tablet     [phone] [address] [place] [address] Phone: [phone] Fax: [phone]    Pt was advised that RX refills may take up to 3 business days.

## 2018-04-20 NOTE — Telephone Encounter (Signed)
Pt notified that [id number]. Pt voiced understanding.

## 2018-04-20 NOTE — Telephone Encounter (Signed)
This was sent in on 9.5.19/thx dmf 

## 2018-04-24 ENCOUNTER — Other Ambulatory Visit: Payer: Self-pay | Admitting: Family Medicine

## 2018-04-24 ENCOUNTER — Telehealth: Payer: Self-pay | Admitting: Family Medicine

## 2018-04-24 NOTE — Telephone Encounter (Signed)
Medication:      clonazePAM (KLONOPIN) 0.5 MG    Pharmacy states they did not receive refill rx  on 04/19/18 as noted.  LOV 04/10/18 Dr. Dayton Martes   [id number] - [address] [place] [address] Phone: [phone] Fax: [phone]

## 2018-04-24 NOTE — Telephone Encounter (Unsigned)
Copied from CRM [id number]. Topic: Quick Communication - Rx Refill/Question >> Apr 24, 2018 11:05 AM Raquel Sarna wrote: Medication:      clonazePAM (KLONOPIN) 0.5 MG   Pt requested this medication to be filled last week.  Was not sent in for refills. Pt still needing refills asap.  [id number] [address] [place] [address] Phone: [phone] Fax: [phone]

## 2018-04-24 NOTE — Telephone Encounter (Signed)
Refaxed/thx dmf 

## 2018-04-25 NOTE — Telephone Encounter (Signed)
Faxed/spoke with pharmacist who is filling/thx dmf

## 2018-05-03 ENCOUNTER — Other Ambulatory Visit: Payer: Self-pay | Admitting: Family Medicine

## 2018-05-08 DIAGNOSIS — Z8719 Personal history of other diseases of the digestive system: Secondary | ICD-10-CM | POA: Diagnosis not present

## 2018-05-08 DIAGNOSIS — H9201 Otalgia, right ear: Secondary | ICD-10-CM | POA: Diagnosis not present

## 2018-05-08 DIAGNOSIS — M26621 Arthralgia of right temporomandibular joint: Secondary | ICD-10-CM | POA: Diagnosis not present

## 2018-05-08 DIAGNOSIS — J343 Hypertrophy of nasal turbinates: Secondary | ICD-10-CM | POA: Diagnosis not present

## 2018-05-09 ENCOUNTER — Other Ambulatory Visit: Payer: Self-pay

## 2018-05-09 MED ORDER — ZOLPIDEM TARTRATE ER 12.5 MG PO TBCR: 12.5000 mg | EXTENDED_RELEASE_TABLET | Freq: Every day | ORAL | 2 refills | Status: DC

## 2018-05-09 NOTE — Telephone Encounter (Signed)
TA-Pt req refill on Ambien CR/CSC, UDS UTD and PMP ok/thx dmf

## 2018-05-12 ENCOUNTER — Other Ambulatory Visit: Payer: Self-pay | Admitting: Family Medicine

## 2018-05-15 NOTE — Telephone Encounter (Signed)
Pt is taking 100mg /thx dmf

## 2018-05-18 ENCOUNTER — Other Ambulatory Visit: Payer: Self-pay | Admitting: Family Medicine

## 2018-05-18 NOTE — Telephone Encounter (Signed)
Requested medication (s) are due for refill today: Yes  Requested medication (s) are on the active medication list: Yes  Last refill:  11/30/17  Future visit scheduled: No      Requested Prescriptions  Pending Prescriptions Disp Refills   oxyCODONE-acetaminophen (PERCOCET) 5-325 MG tablet 30 tablet 0    Sig: Take 1 tablet by mouth daily as needed for severe pain.     Not Delegated - Analgesics:  Opioid Agonist Combinations Failed - 05/18/2018 11:39 AM      Failed - This refill cannot be delegated      Failed - Urine Drug Screen completed in last 360 days.      Passed - Valid encounter within last 6 months    Recent Outpatient Visits          1 month ago Opioid type dependence, continuous (HCC)   [facility] Quan, [place] M, MD   5 months ago Opioid type dependence, continuous [facility])   [facility] [address] Bryn Gulling, MD   11 months ago Well woman exam   Nature conservation officer at [facility], Bryn Gulling, MD   2 years ago Urinary urgency   Nature conservation officer at [facility], Binnie Rail, FNP   2 years ago Well woman exam   Nature conservation officer at [facility], Bryn Gulling, MD

## 2018-05-18 NOTE — Telephone Encounter (Signed)
Copied from CRM [id number]. Topic: Quick Communication - Rx Refill/Question >> May 18, 2018 11:27 AM Jaquita Rector A wrote: Medication: oxyCODONE-acetaminophen (PERCOCET) 5-325 MG tablet  Has the patient contacted their pharmacy?  No   Preferred Pharmacy (with phone number or street name): [id number] [place], [place] - [address] [phone] (Phone) [phone] (Fax)     Agent: Please be advised that RX refills may take up to 3 business days. We ask that you follow-up with your pharmacy.

## 2018-05-21 MED ORDER — OXYCODONE-ACETAMINOPHEN 5-325 MG PO TABS: 1.0000 | ORAL_TABLET | Freq: Every day | ORAL | 0 refills | Status: DC | PRN

## 2018-05-21 NOTE — Telephone Encounter (Signed)
TA-CSC & UDS were updated on 8.27.19 and PMP ok/plz advise/thx dmf

## 2018-05-29 ENCOUNTER — Encounter: Payer: Self-pay | Admitting: Internal Medicine

## 2018-05-29 ENCOUNTER — Ambulatory Visit (INDEPENDENT_AMBULATORY_CARE_PROVIDER_SITE_OTHER): Payer: BLUE CROSS/BLUE SHIELD | Admitting: Internal Medicine

## 2018-05-29 VITALS — BP 118/80 | HR 90 | Ht 64.0 in | Wt 194.0 lb

## 2018-05-29 DIAGNOSIS — R002 Palpitations: Secondary | ICD-10-CM | POA: Diagnosis not present

## 2018-05-29 DIAGNOSIS — R0789 Other chest pain: Secondary | ICD-10-CM

## 2018-05-29 NOTE — Patient Instructions (Signed)
Medication Instructions:  Continue current medications If you need a refill on your cardiac medications before your next appointment, please call your pharmacy.   Lab work: NONE  Testing/Procedures: Your physician has requested that you have an exercise stress myoview.   Your physician has recommended that you wear an event monitor. Event monitors are medical devices that record the heart's electrical activity. Doctors most often [place] these monitors to diagnose arrhythmias. Arrhythmias are problems with the speed or rhythm of the heartbeat. The monitor is a small, portable device. You can wear one while you do your normal daily activities. This is usually used to diagnose what is causing palpitations/syncope (passing out). -- 2 week ZIO  ** both appointment @ [address] - [address]rd Floor   Follow-Up: At [facility], you and your health needs are our priority.  As part of our continuing mission to provide you with exceptional heart care, we have created designated Provider Care Teams.  These Care Teams include your primary Cardiologist (physician) and Advanced Practice Providers (APPs -  Physician Assistants and Nurse Practitioners) who all work together to provide you with the care you need, when you need it. You will need a follow up appointment in 4-6 weeks.  Please call our office 2 months in advance to schedule this appointment.  You may see Dr. Rennis Golden or one of the following Advanced Practice Providers on your designated Care Team: Azalee Course, [place] . Micah Flesher, PA-C  Any Other Special Instructions Will Be Listed Below (If Applicable).

## 2018-05-29 NOTE — Progress Notes (Signed)
OFFICE CONSULT NOTE  Chief Complaint:  Palpitations  Primary Care Physician: Dianne Dun, MD  HPI:  [name] is a 47 y.o. female who is being seen today for the evaluation of palpitations at the request of Dianne Dun, MD. This is a pleasant 47 year old female was kindly referred for evaluation of palpitations.  She does have a history of bulimia in the past as well as fibromyalgia and palpitations.  She was told she had PVCs at one point and had remotely seen a cardiologist at the [facility] office who she thinks is retired.  She did say she had stress testing at the time but that was more than 10 years ago.  Recently she has been having worsening palpitations.  She says it feels somewhat different.  Feels like her heart is pausing and is worse when laying on her left side.  This is associated with some chest pressure.  She is under significant stress.  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  She denies any worsening shortness of breath, chest pain or associated symptoms with exercise which she does several times a week or activities such as walking her dogs.  PMHx:  Past Medical History:  Diagnosis Date  . Bulging disc    T7/8-receiving ESI's (Dr. Charlett Blake)  . Bulimia nervosa    history  . Fibromyalgia    Dr. Corliss Skains  . Genital herpes     Past Surgical History:  Procedure Laterality Date  . ABDOMINAL HYSTERECTOMY     endometriosis  . CESAREAN SECTION    . NECK SURGERY    . PAROTIDECTOMY  2009   benign tumor  . ROTATOR CUFF REPAIR      FAMHx:  Family History  Problem Relation Age of Onset  . Lung cancer Mother   . Stroke Father   . Breast cancer Maternal Aunt 46    SOCHx:   reports that she has never smoked. She has never used smokeless tobacco. She reports that she does not drink alcohol or use drugs.  ALLERGIES:  Allergies  Allergen Reactions  . Codeine    REACTION: GI upset  . Doxycycline Nausea And Vomiting  . Neosporin [Neomycin-Bacitracin Zn-Polymyx]     Patient reported    ROS: Pertinent items noted in HPI and remainder of comprehensive ROS otherwise negative.  HOME MEDS: Current Outpatient Medications on File Prior to Visit  Medication Sig Dispense Refill  . clonazePAM (KLONOPIN) 0.5 MG tablet TAKE 1 TABLET BY MOUTH IN THE EVENING 30 tablet 4  . cyclobenzaprine (FLEXERIL) 10 MG tablet TAKE 1 TABLET BY MOUTH AT BEDTIME AS NEEDED MUSCLE SPASMS 30 tablet 4  . doxycycline (VIBRAMYCIN) 100 MG capsule doxycycline hyclate 100 mg capsule    . lansoprazole (PREVACID) 30 MG capsule TAKE 1 CAPSULE (30 MG TOTAL) BY MOUTH DAILY. 90 capsule 2  . meclizine (ANTIVERT) 25 MG tablet meclizine 25 mg tablet    . Nutritional Supplements (VITAMIN D BOOSTER PO) Take by mouth once a week.    . ondansetron (ZOFRAN) 4 MG tablet ondansetron HCl 4 mg tablet    . oxyCODONE-acetaminophen (PERCOCET) 5-325 MG tablet Take 1 tablet by mouth daily as needed for severe pain. 30 tablet 0  . simvastatin (ZOCOR) 10 MG tablet TAKE 1 TABLET BY MOUTH AT BEDTIME 90 tablet 0  . sulfamethoxazole-trimethoprim (BACTRIM DS,SEPTRA DS) 800-160 MG tablet sulfamethoxazole 800 mg-trimethoprim 160 mg tablet    .  valACYclovir (VALTREX) 500 MG tablet Take 1 tablet (500 mg total) by mouth 2 (two) times daily as needed. 180 tablet 1  . Vitamin D, Ergocalciferol, (DRISDOL) 50000 units CAPS capsule Take 1 capsule (50,000 Units total) by mouth every 7 (seven) days. Take 1 capsule by mouth every 7 days and then come back to recheck labs 8 capsule 0  . zolpidem (AMBIEN CR) 12.5 MG CR tablet Take 1 tablet (12.5 mg total) by mouth at bedtime. 30 tablet 2   No current facility-administered medications on file prior to visit.     LABS/IMAGING: No results found for this or any previous visit (from the past 48 hour(s)). No results found.  LIPID PANEL:    Component Value Date/Time   CHOL 194  06/12/2017 1428   TRIG 96.0 06/12/2017 1428   HDL 52.60 06/12/2017 1428   CHOLHDL 4 06/12/2017 1428   VLDL 19.2 06/12/2017 1428   LDLCALC 123 (H) 06/12/2017 1428   LDLDIRECT 150.0 04/28/2016 1049    WEIGHTS: Wt Readings from Last 3 Encounters:  05/29/18 194 lb (88 kg)  04/10/18 192 lb (87.1 kg)  11/30/17 185 lb (83.9 kg)    VITALS: BP 118/80   Pulse 90   Ht 5\' 4"  (1.626 m)   Wt 194 lb (88 kg)   BMI 33.30 kg/m   EXAM: General appearance: alert and no distress Neck: no JVD, supple, symmetrical, trachea midline and thyroid not enlarged, symmetric, no tenderness/mass/nodules Lungs: clear to auscultation bilaterally Heart: regular rate and rhythm Abdomen: soft, non-tender; bowel sounds normal; no masses,  no organomegaly Extremities: extremities normal, atraumatic, no cyanosis or edema Pulses: 2+ and symmetric Skin: Skin color, texture, turgor normal. No rashes or lesions Neurologic: Grossly normal Psych: Pleasant  EKG: Normal sinus rhythm at 90- personally reviewed  ASSESSMENT: 1. Palpitations 2. Chest pressure  PLAN: 1.   [name] is reporting more frequent palpitations and associated chest pressure.  Stress may be playing a role in this as it sounds like her job is quite stressful.  She has a history of PVCs in the past.  She underwent stress testing however many years ago.  She does not seem to be symptomatic with exertion however I would like for her to undergo repeat stress testing.  In addition we will place a 2-week ZIO patch monitor to see if we can pick up on the cause of her palpitations.  Plan follow-up with me afterwards.  Thanks again for the kind referral.  Chrystie Nose, MD, [facility]  [place]  [facility]  Medical Director of the Advanced Lipid Disorders &  Cardiovascular Risk Reduction Clinic Diplomate of the American Board of Clinical Lipidology Attending Cardiologist  Direct Dial: [phone]  Fax: [phone]  Website:   [email or web address] 05/29/2018, 1:28 PM

## 2018-06-06 ENCOUNTER — Ambulatory Visit (INDEPENDENT_AMBULATORY_CARE_PROVIDER_SITE_OTHER): Payer: BLUE CROSS/BLUE SHIELD

## 2018-06-06 ENCOUNTER — Encounter: Payer: Self-pay | Admitting: *Deleted

## 2018-06-06 DIAGNOSIS — R0789 Other chest pain: Secondary | ICD-10-CM

## 2018-06-06 DIAGNOSIS — R002 Palpitations: Secondary | ICD-10-CM | POA: Diagnosis not present

## 2018-06-06 NOTE — Progress Notes (Signed)
Thanks for that helpful information.  Dr. Rexene Edison

## 2018-06-06 NOTE — Progress Notes (Signed)
  Patient ID: [name], female   DOB: 02/08/1971, 47 y.o.   MRN: [id number]  [employer]  Patch 14 day long term holter monitor was used instead of a ZIO patch.  Patient is scheduled for a Nuclear stress test 06/13/18.  The monitor will need to be removed during this test.  ZIO patch is a one patch system.  When the monitor falls off or is removed, the test is over.  [employer] provides 3 patches so the patient will be able to remove the patch for her stress test and reapply it when she is finished.  Both products reports will contain the same information.

## 2018-06-07 ENCOUNTER — Encounter (HOSPITAL_COMMUNITY): Payer: BLUE CROSS/BLUE SHIELD

## 2018-06-11 ENCOUNTER — Telehealth (HOSPITAL_COMMUNITY): Payer: Self-pay | Admitting: *Deleted

## 2018-06-11 NOTE — Telephone Encounter (Signed)
Patient given detailed instructions per Myocardial Perfusion Study Information Sheet for the test on 06/13/18 at 1000. Patient notified to arrive 15 minutes early and that it is imperative to arrive on time for appointment to keep from having the test rescheduled.  If you need to cancel or reschedule your appointment, please call the office within 24 hours of your appointment. . Patient verbalized understanding.Chuck Caban, Adelene Idler

## 2018-06-13 ENCOUNTER — Other Ambulatory Visit: Payer: Self-pay | Admitting: Family Medicine

## 2018-06-13 ENCOUNTER — Ambulatory Visit (HOSPITAL_COMMUNITY): Payer: BLUE CROSS/BLUE SHIELD | Attending: Cardiovascular Disease

## 2018-06-13 DIAGNOSIS — R0789 Other chest pain: Secondary | ICD-10-CM | POA: Diagnosis not present

## 2018-06-13 DIAGNOSIS — R002 Palpitations: Secondary | ICD-10-CM | POA: Diagnosis not present

## 2018-06-13 DIAGNOSIS — E559 Vitamin D deficiency, unspecified: Secondary | ICD-10-CM

## 2018-06-13 LAB — MYOCARDIAL PERFUSION IMAGING
CHL CUP NUCLEAR SDS: 1
CHL CUP NUCLEAR SRS: 0
CSEPED: 7 min
CSEPEW: 9.1 METS
CSEPHR: 87 %
CSEPPHR: 151 {beats}/min
Exercise duration (sec): 15 s
LV dias vol: 60 mL (ref 46–106)
LV sys vol: 12 mL
MPHR: 174 {beats}/min
RPE: 18
Rest HR: 71 {beats}/min
SSS: 1
TID: 0.93

## 2018-06-13 MED ORDER — TECHNETIUM TC 99M TETROFOSMIN IV KIT
31.9000 | PACK | Freq: Once | INTRAVENOUS | Status: AC | PRN
  Administered 2018-06-13: 31.9 via INTRAVENOUS
  Filled 2018-06-13: qty 32

## 2018-06-13 MED ORDER — TECHNETIUM TC 99M TETROFOSMIN IV KIT
10.7000 | PACK | Freq: Once | INTRAVENOUS | Status: AC | PRN
  Administered 2018-06-13: 10.7 via INTRAVENOUS
  Filled 2018-06-13: qty 11

## 2018-06-17 ENCOUNTER — Other Ambulatory Visit: Payer: Self-pay | Admitting: Family Medicine

## 2018-06-23 DIAGNOSIS — M940 Chondrocostal junction syndrome [Tietze]: Secondary | ICD-10-CM | POA: Insufficient documentation

## 2018-06-26 ENCOUNTER — Encounter: Payer: Self-pay | Admitting: Internal Medicine

## 2018-06-26 ENCOUNTER — Ambulatory Visit (INDEPENDENT_AMBULATORY_CARE_PROVIDER_SITE_OTHER): Payer: BLUE CROSS/BLUE SHIELD | Admitting: Internal Medicine

## 2018-06-26 VITALS — BP 110/78 | HR 83 | Ht 65.0 in | Wt 193.4 lb

## 2018-06-26 DIAGNOSIS — R0602 Shortness of breath: Secondary | ICD-10-CM | POA: Diagnosis not present

## 2018-06-26 DIAGNOSIS — R002 Palpitations: Secondary | ICD-10-CM

## 2018-06-26 DIAGNOSIS — R0789 Other chest pain: Secondary | ICD-10-CM

## 2018-06-26 NOTE — Patient Instructions (Signed)
Medication Instructions:  Continue current medications If you need a refill on your cardiac medications before your next appointment, please call your pharmacy.   Testing/Procedures: Your physician has requested that you have an echocardiogram. Echocardiography is a painless test that uses sound waves to create images of your heart. It provides your doctor with information about the size and shape of your heart and how well your heart's chambers and valves are working. This procedure takes approximately one hour. There are no restrictions for this procedure. -- done at [address] - [address]  Follow-Up: [facility], you and your health needs are our priority.  As part of our continuing mission to provide you with exceptional heart care, we have created designated Provider Care Teams.  These Care Teams include your primary Cardiologist (physician) and Advanced Practice Providers (APPs -  Physician Assistants and Nurse Practitioners) who all work together to provide you with the care you need, when you need it. You will need a follow up appointment in 1 month.  Please call our office 2 months in advance to schedule this appointment.  You may see Chrystie NoseKenneth C Hilty, MD or one of the following Advanced Practice Providers on your designated Care Team: BelmontHao Meng, [place] JerseyPA-C . Micah FlesherAngela Duke, PA-C  Any Other Special Instructions Will Be Listed Below (If Applicable).

## 2018-06-26 NOTE — Progress Notes (Signed)
OFFICE CONSULT NOTE  Chief Complaint:  Follow-up palpitations, chest pressure  Primary Care Physician: Dianne Dun, MD  HPI:  [name] is a 47 y.o. female who is being seen today for the evaluation of palpitations at the request of Dianne Dun, MD. This is a pleasant 47 year old female was kindly referred for evaluation of palpitations.  She does have a history of bulimia in the past as well as fibromyalgia and palpitations.  She was told she had PVCs at one point and had remotely seen a cardiologist at the [facility] office who she thinks is retired.  She did say she had stress testing at the time but that was more than 10 years ago.  Recently she has been having worsening palpitations.  She says it feels somewhat different.  Feels like her heart is pausing and is worse when laying on her left side.  This is associated with some chest pressure.  She is under significant stress.  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  She denies any worsening shortness of breath, chest pain or associated symptoms with exercise which she does several times a week or activities such as walking her dogs.  06/26/2018  [name] returns today for follow-up.  She reports persistent palpitations and chest pressure with shortness of breath.  She says this comes on with minimal exertion.  She also notes it is somewhat worse when she is talking for a length of time.  She is had some symptoms that were provoked by eating however GI work-up was negative.  Her monitor unfortunately is still pending although she did turn it in about a week ago.  We will continue to follow up on that.  Her stress test was negative for ischemia and showed greater than normal LV function.  She did say that she had done some research and feels that she has symptoms consistent with possible hypertrophic cardia myopathy although there is no history of that  in the family or early sudden cardiac death.  Based on her persistent chest pressure and dyspnea, we could consider further myocardial evaluation with echo.  PMHx:  Past Medical History:  Diagnosis Date  . Bulging disc    T7/8-receiving ESI's (Dr. Charlett Blake)  . Bulimia nervosa    history  . Fibromyalgia    Dr. Corliss Skains  . Genital herpes     Past Surgical History:  Procedure Laterality Date  . ABDOMINAL HYSTERECTOMY     endometriosis  . CESAREAN SECTION    . NECK SURGERY    . PAROTIDECTOMY  2009   benign tumor  . ROTATOR CUFF REPAIR      FAMHx:  Family History  Problem Relation Age of Onset  . Lung cancer Mother   . Stroke Father   . Breast cancer Maternal Aunt 25    SOCHx:   reports that she has never smoked. She has never used smokeless tobacco. She reports that she does not drink alcohol or use drugs.  ALLERGIES:  Allergies  Allergen Reactions  . Codeine     REACTION: GI upset  . Doxycycline Nausea And Vomiting  . Neosporin [Neomycin-Bacitracin Zn-Polymyx]     Patient reported    ROS: Pertinent items noted in HPI and remainder of comprehensive ROS otherwise negative.  HOME MEDS: Current Outpatient Medications on File Prior to Visit  Medication Sig Dispense Refill  . clonazePAM (KLONOPIN) 0.5 MG  tablet TAKE 1 TABLET BY MOUTH IN THE EVENING 30 tablet 4  . cyclobenzaprine (FLEXERIL) 10 MG tablet TAKE 1 TABLET BY MOUTH AT BEDTIME AS NEEDED MUSCLE SPASMS 30 tablet 4  . lansoprazole (PREVACID) 30 MG capsule TAKE 1 CAPSULE BY MOUTH EVERY DAY 90 capsule 2  . meclizine (ANTIVERT) 25 MG tablet meclizine 25 mg tablet    . Nutritional Supplements (VITAMIN D BOOSTER PO) Take by mouth once a week.    . ondansetron (ZOFRAN) 4 MG tablet ondansetron HCl 4 mg tablet    . oxyCODONE-acetaminophen (PERCOCET) 5-325 MG tablet Take 1 tablet by mouth daily as needed for severe pain. 30 tablet 0  . simvastatin (ZOCOR) 10 MG tablet TAKE 1 TABLET BY MOUTH AT BEDTIME 90 tablet 0  .  valACYclovir (VALTREX) 500 MG tablet Take 1 tablet (500 mg total) by mouth 2 (two) times daily as needed. 180 tablet 1  . Vitamin D, Ergocalciferol, (DRISDOL) 50000 units CAPS capsule TAKE ONE CAPSULE BY MOUTH EVERY WEEK 8 capsule 0  . zolpidem (AMBIEN CR) 12.5 MG CR tablet Take 1 tablet (12.5 mg total) by mouth at bedtime. 30 tablet 2   No current facility-administered medications on file prior to visit.     LABS/IMAGING: No results found for this or any previous visit (from the past 48 hour(s)). No results found.  LIPID PANEL:    Component Value Date/Time   CHOL 194 06/12/2017 1428   TRIG 96.0 06/12/2017 1428   HDL 52.60 06/12/2017 1428   CHOLHDL 4 06/12/2017 1428   VLDL 19.2 06/12/2017 1428   LDLCALC 123 (H) 06/12/2017 1428   LDLDIRECT 150.0 04/28/2016 1049    WEIGHTS: Wt Readings from Last 3 Encounters:  06/26/18 193 lb 6.4 oz (87.7 kg)  06/13/18 194 lb (88 kg)  05/29/18 194 lb (88 kg)    VITALS: BP 110/78   Pulse 83   Ht 5\' 5"  (1.651 m)   Wt 193 lb 6.4 oz (87.7 kg)   SpO2 100%   BMI 32.18 kg/m   EXAM: Deferred  EKG: Deferred  ASSESSMENT: 1. Palpitations 2. Chest pressure - low risk myoview, no ischemia, EF 52% (05/2018) 3. Dyspnea  PLAN: 1.   [name] had a low risk Myoview stress test with no reversible ischemia and normal LV function.  She has persistent chest pressure and shortness of breath and is concerned about structural heart disease, specifically hypertrophic cardiomyopathy.  There is no history of this in the family and I feel that it somewhat unlikely however he did have hyperdynamic LV function on nuclear stress testing.  We will go ahead and get an echocardiogram to evaluate for structural abnormalities.  Her monitor unfortunately still pending, but I expect to start low-dose beta-blocker based on her persistent symptoms and the monitor findings.  Plan follow-up in a month to review her echo results.  Chrystie NoseKenneth C. Harsimran Westman, MD, [facility]FACC, FACP  [facility]  [facility]  Medical Director of the Advanced Lipid Disorders &  Cardiovascular Risk Reduction Clinic Diplomate of the American Board of Clinical Lipidology Attending Cardiologist  Direct Dial: [phone]  Fax: [phone]  Website:  [email or web address]  Coby Antrobus C Jamion Carter 06/26/2018, 8:15 AM

## 2018-06-26 NOTE — Addendum Note (Signed)
Addended by: Chrystie NoseHILTY, KENNETH C on: 06/26/2018 08:53 AM   Modules accepted: Level of Service

## 2018-06-27 ENCOUNTER — Telehealth: Payer: Self-pay | Admitting: Internal Medicine

## 2018-06-27 MED ORDER — METOPROLOL SUCCINATE ER 25 MG PO TB24: 12.5000 mg | ORAL_TABLET | Freq: Every day | ORAL | 6 refills | Status: DC

## 2018-06-27 NOTE — Telephone Encounter (Signed)
Spoke to patient result of monitor--she states she was expecting a call from Dr Rennis GoldenHilty regarding results.   RN explained the process , but will send message to Dr Rennis GoldenHilty.  medication toprol xl 12.5 mg daily-- sent to pharmacy

## 2018-06-27 NOTE — Telephone Encounter (Signed)
Called patient and discussed monitor results. Plan echo tomorrow.  Dr. HRexene Edison

## 2018-06-27 NOTE — Telephone Encounter (Signed)
New Message         Patient is calling to get results.

## 2018-06-28 ENCOUNTER — Ambulatory Visit (HOSPITAL_COMMUNITY): Payer: BLUE CROSS/BLUE SHIELD | Attending: Internal Medicine

## 2018-06-28 ENCOUNTER — Other Ambulatory Visit: Payer: Self-pay

## 2018-06-28 DIAGNOSIS — R0602 Shortness of breath: Secondary | ICD-10-CM | POA: Diagnosis not present

## 2018-06-28 DIAGNOSIS — R0789 Other chest pain: Secondary | ICD-10-CM | POA: Diagnosis not present

## 2018-07-11 ENCOUNTER — Other Ambulatory Visit: Payer: Self-pay | Admitting: Family Medicine

## 2018-07-27 ENCOUNTER — Ambulatory Visit (INDEPENDENT_AMBULATORY_CARE_PROVIDER_SITE_OTHER): Payer: BLUE CROSS/BLUE SHIELD | Admitting: Internal Medicine

## 2018-07-27 ENCOUNTER — Encounter: Payer: Self-pay | Admitting: Internal Medicine

## 2018-07-27 VITALS — BP 104/84 | HR 82 | Ht 65.0 in | Wt 194.0 lb

## 2018-07-27 DIAGNOSIS — R002 Palpitations: Secondary | ICD-10-CM | POA: Diagnosis not present

## 2018-07-27 DIAGNOSIS — R0789 Other chest pain: Secondary | ICD-10-CM

## 2018-07-27 DIAGNOSIS — R0602 Shortness of breath: Secondary | ICD-10-CM | POA: Diagnosis not present

## 2018-07-27 NOTE — Progress Notes (Signed)
OFFICE CONSULT NOTE  Chief Complaint:  Follow-up echo  Primary Care Physician: Dianne Dun, MD  HPI:  [name] is a 47 y.o. female who is being seen today for the evaluation of palpitations at the request of Dianne Dun, MD. This is a pleasant 47 year old female was kindly referred for evaluation of palpitations.  She does have a history of bulimia in the past as well as fibromyalgia and palpitations.  She was told she had PVCs at one point and had remotely seen a cardiologist at the [facility] office who she thinks is retired.  She did say she had stress testing at the time but that was more than 10 years ago.  Recently she has been having worsening palpitations.  She says it feels somewhat different.  Feels like her heart is pausing and is worse when laying on her left side.  This is associated with some chest pressure.  She is under significant stress.  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  She denies any worsening shortness of breath, chest pain or associated symptoms with exercise which she does several times a week or activities such as walking her dogs.  06/26/2018  [name] returns today for follow-up.  She reports persistent palpitations and chest pressure with shortness of breath.  She says this comes on with minimal exertion.  She also notes it is somewhat worse when she is talking for a length of time.  She is had some symptoms that were provoked by eating however GI work-up was negative.  Her monitor unfortunately is still pending although she did turn it in about a week ago.  We will continue to follow up on that.  Her stress test was negative for ischemia and showed greater than normal LV function.  She did say that she had done some research and feels that she has symptoms consistent with possible hypertrophic cardia myopathy although there is no history of that in the family or early  sudden cardiac death.  Based on her persistent chest pressure and dyspnea, we could consider further myocardial evaluation with echo.  07/27/2018  [name] is here today for follow-up.  She had an echocardiogram because of persistent shortness of breath and chest pressure.  This was normal and showed normal systolic and diastolic function without any evidence of any hypertrophic cardiomyopathy.  She does report she has had some improvement in her palpitations with Toprol.  Occasionally she gets a low blood pressure from time to time.  She may be able to liberalize salt in her diet although she says she generally avoids it.  PMHx:  Past Medical History:  Diagnosis Date  . Bulging disc    T7/8-receiving ESI's (Dr. Charlett Blake)  . Bulimia nervosa    history  . Fibromyalgia    Dr. Corliss Skains  . Genital herpes     Past Surgical History:  Procedure Laterality Date  . ABDOMINAL HYSTERECTOMY     endometriosis  . CESAREAN SECTION    . NECK SURGERY    . PAROTIDECTOMY  2009   benign tumor  . ROTATOR CUFF REPAIR      FAMHx:  Family History  Problem Relation Age of Onset  . Lung cancer Mother   . Stroke Father   . Breast cancer Maternal Aunt 17    SOCHx:   reports that she has never smoked. She has never used smokeless tobacco.  She reports that she does not drink alcohol or use drugs.  ALLERGIES:  Allergies  Allergen Reactions  . Codeine     REACTION: GI upset  . Doxycycline Nausea And Vomiting  . Neosporin [Neomycin-Bacitracin Zn-Polymyx]     Patient reported    ROS: Pertinent items noted in HPI and remainder of comprehensive ROS otherwise negative.  HOME MEDS: Current Outpatient Medications on File Prior to Visit  Medication Sig Dispense Refill  . clonazePAM (KLONOPIN) 0.5 MG tablet TAKE 1 TABLET BY MOUTH IN THE EVENING 30 tablet 4  . cyclobenzaprine (FLEXERIL) 10 MG tablet TAKE 1 TABLET BY MOUTH AT BEDTIME AS NEEDED MUSCLE SPASMS 30 tablet 4  . lansoprazole (PREVACID) 30 MG  capsule TAKE 1 CAPSULE BY MOUTH EVERY DAY 90 capsule 2  . meclizine (ANTIVERT) 25 MG tablet meclizine 25 mg tablet    . metoprolol succinate (TOPROL XL) 25 MG 24 hr tablet Take 0.5 tablets (12.5 mg total) by mouth daily. 15 tablet 6  . Nutritional Supplements (VITAMIN D BOOSTER PO) Take by mouth once a week.    . ondansetron (ZOFRAN) 4 MG tablet ondansetron HCl 4 mg tablet    . oxyCODONE-acetaminophen (PERCOCET) 5-325 MG tablet Take 1 tablet by mouth daily as needed for severe pain. 30 tablet 0  . simvastatin (ZOCOR) 10 MG tablet TAKE 1 TABLET BY MOUTH AT BEDTIME 90 tablet 0  . valACYclovir (VALTREX) 500 MG tablet Take 1 tablet (500 mg total) by mouth 2 (two) times daily as needed. 180 tablet 1  . Vitamin D, Ergocalciferol, (DRISDOL) 50000 units CAPS capsule TAKE ONE CAPSULE BY MOUTH EVERY WEEK 8 capsule 0  . zolpidem (AMBIEN CR) 12.5 MG CR tablet Take 1 tablet (12.5 mg total) by mouth at bedtime. 30 tablet 2   No current facility-administered medications on file prior to visit.     LABS/IMAGING: No results found for this or any previous visit (from the past 48 hour(s)). No results found.  LIPID PANEL:    Component Value Date/Time   CHOL 194 06/12/2017 1428   TRIG 96.0 06/12/2017 1428   HDL 52.60 06/12/2017 1428   CHOLHDL 4 06/12/2017 1428   VLDL 19.2 06/12/2017 1428   LDLCALC 123 (H) 06/12/2017 1428   LDLDIRECT 150.0 04/28/2016 1049    WEIGHTS: Wt Readings from Last 3 Encounters:  07/27/18 194 lb (88 kg)  06/26/18 193 lb 6.4 oz (87.7 kg)  06/13/18 194 lb (88 kg)    VITALS: BP 104/84   Pulse 82   Ht 5\' 5"  (1.651 m)   Wt 194 lb (88 kg)   BMI 32.28 kg/m   EXAM: Deferred  EKG: Deferred  ASSESSMENT: 1. Palpitations 2. Chest pressure - low risk myoview, no ischemia, EF 52% (05/2018) 3. Dyspnea  PLAN: 1.   [name] had a normal echocardiogram with normal systolic and diastolic function.  She also had a low risk Myoview stress test with no ischemia.  Her  palpitations are improved on low-dose beta-blocker.  I recommend continuing this as long as blood pressure tolerates.  We can plan to see her back annually or sooner as necessary.  Chrystie NoseKenneth C. Kendre Sires, MD, [facility]annahFACC, FACP  [place]  [facility]  Medical Director of the Advanced Lipid Disorders &  Cardiovascular Risk Reduction Clinic Diplomate of the American Board of Clinical Lipidology Attending Cardiologist  Direct Dial: [phone]  Fax: [phone]  Website:  [email or web address]  [name] 07/27/2018, 8:22 AM

## 2018-07-27 NOTE — Patient Instructions (Signed)
Medication Instructions:  Continue current medications If you need a refill on your cardiac medications before your next appointment, please call your pharmacy.   Follow-Up: At [facility], you and your health needs are our priority.  As part of our continuing mission to provide you with exceptional heart care, we have created designated Provider Care Teams.  These Care Teams include your primary Cardiologist (physician) and Advanced Practice Providers (APPs -  Physician Assistants and Nurse Practitioners) who all work together to provide you with the care you need, when you need it. You will need a follow up appointment in 12 months.  Please call our office 2 months in advance to schedule this appointment.  You may see Kenneth C Hilty, MD or one of the following Advanced Practice Providers on your designated Care Team: Hao Meng, PA-C . Angela Duke, PA-C  Any Other Special Instructions Will Be Listed Below (If Applicable).    

## 2018-08-02 ENCOUNTER — Telehealth: Payer: Self-pay | Admitting: Family Medicine

## 2018-08-02 NOTE — Telephone Encounter (Signed)
Copied from CRM [id number]. Topic: Quick Communication - Rx Refill/Question >> Aug 02, 2018  1:55 PM Fanny BienIlderton, Jessica L wrote: Medication: oxyCODONE-acetaminophen (PERCOCET) 5-325 MG tablet [[id number]][[id number]]   Has the patient contacted their pharmacy? No Preferred Pharmacy (with phone number or street name):[id number] [place] - [address] [phone] (Phone) [phone] (Fax)  Agent: Please be advised that RX refills may take up to 3 business days. We ask that you follow-up with your pharmacy.

## 2018-08-03 MED ORDER — OXYCODONE-ACETAMINOPHEN 5-325 MG PO TABS: 1.0000 | ORAL_TABLET | Freq: Every day | ORAL | 0 refills | Status: DC | PRN

## 2018-08-03 NOTE — Telephone Encounter (Signed)
OK to fill

## 2018-08-06 ENCOUNTER — Other Ambulatory Visit: Payer: Self-pay | Admitting: Family Medicine

## 2018-08-07 ENCOUNTER — Other Ambulatory Visit: Payer: Self-pay | Admitting: Family Medicine

## 2018-08-07 MED ORDER — ZOLPIDEM TARTRATE ER 12.5 MG PO TBCR: 12.5000 mg | EXTENDED_RELEASE_TABLET | Freq: Every day | ORAL | 2 refills | Status: DC

## 2018-08-07 NOTE — Telephone Encounter (Signed)
TA-Rec req for Ambien to [facility] UTD/PMP ok no red flags/thx dmf

## 2018-08-07 NOTE — Telephone Encounter (Signed)
Copied from CRM [phone]. Topic: Quick Communication - Rx Refill/Question >> Aug 07, 2018 10:26 AM Herby AbrahamJohnson, Shiquita C wrote: Medication: zolpidem (AMBIEN CR) 12.5 MG CR tablet - pt says that she requested this medication on 08/03/18 w/ pharmacy at the same time as her other request.   Has the patient contacted their pharmacy? Yes  (Agent: If no, request that the patient contact the pharmacy for the refill.) (Agent: If yes, when and what did the pharmacy advise?)  Preferred Pharmacy (with phone number or street name): [id number] [place], [place] - [address]           [phone] (Phone) [phone] (Fax)  Agent: Please be advised that RX refills may take up to 3 business days. We ask that you follow-up with your pharmacy.

## 2018-08-07 NOTE — Telephone Encounter (Signed)
Pt called in to check the status of her Rx. Showing that medication is awaiting MD signature.   Please advise/assist.

## 2018-08-07 NOTE — Telephone Encounter (Signed)
Requested Prescriptions  Pending Prescriptions Disp Refills  . zolpidem (AMBIEN CR) 12.5 MG CR tablet 30 tablet 2    Sig: Take 1 tablet (12.5 mg total) by mouth at bedtime.     There is no refill protocol information for this order    Requested medication (s) are due for refill today: yes  Requested medication (s) are on the active medication list: yes  Last refill:  05/09/18  Future visit scheduled: no  Notes to clinic:  Not delegated

## 2018-08-10 NOTE — Telephone Encounter (Signed)
Rx was filled & sent to pharmacy on 12/20.

## 2018-09-17 ENCOUNTER — Telehealth: Payer: Self-pay | Admitting: Family Medicine

## 2018-09-19 MED ORDER — CLONAZEPAM 0.5 MG PO TABS: 0.5000 mg | ORAL_TABLET | Freq: Every evening | ORAL | 4 refills | Status: DC

## 2018-09-19 MED ORDER — CYCLOBENZAPRINE HCL 10 MG PO TABS: ORAL_TABLET | ORAL | 4 refills | Status: DC

## 2018-09-19 NOTE — Addendum Note (Signed)
Addended by: Dianne Dun on: 09/19/2018 12:29 PM   Modules accepted: Orders

## 2018-09-19 NOTE — Telephone Encounter (Signed)
eRx refills sent. 

## 2018-10-29 ENCOUNTER — Other Ambulatory Visit: Payer: Self-pay | Admitting: Family Medicine

## 2018-10-29 NOTE — Telephone Encounter (Signed)
TA-LOV 8.27.19/UDS & CSC UTD/Per [place] PMP no red flags and pt is compliant/prepared with note for pt to F/U in 3 months (for safety)/plz advise/thx dmf

## 2018-11-14 ENCOUNTER — Other Ambulatory Visit: Payer: Self-pay | Admitting: Family Medicine

## 2018-11-16 MED ORDER — OXYCODONE-ACETAMINOPHEN 5-325 MG PO TABS: 1.0000 | ORAL_TABLET | Freq: Every day | ORAL | 0 refills | Status: DC | PRN

## 2018-12-17 ENCOUNTER — Other Ambulatory Visit: Payer: Self-pay | Admitting: Internal Medicine

## 2019-02-12 ENCOUNTER — Telehealth: Payer: Self-pay | Admitting: Family Medicine

## 2019-02-12 DIAGNOSIS — M797 Fibromyalgia: Secondary | ICD-10-CM

## 2019-02-12 NOTE — Telephone Encounter (Signed)
Referral Request - Has patient seen PCP for this complaint? Yes.   *If NO, is insurance requiring patient see PCP for this issue before PCP can refer them? Referral for which specialty: [facility] Pain management Preferred provider/office: [facility] Ph#  [phone] Reason for referral: Severe Pain

## 2019-02-13 NOTE — Telephone Encounter (Signed)
Okay for referral?

## 2019-02-13 NOTE — Telephone Encounter (Signed)
Referral placed.

## 2019-02-13 NOTE — Telephone Encounter (Signed)
Yes okay to place referral. 

## 2019-02-20 ENCOUNTER — Telehealth: Payer: Self-pay | Admitting: Family Medicine

## 2019-02-20 ENCOUNTER — Encounter: Payer: Self-pay | Admitting: Family Medicine

## 2019-02-20 DIAGNOSIS — F331 Major depressive disorder, recurrent, moderate: Secondary | ICD-10-CM | POA: Diagnosis not present

## 2019-02-20 MED ORDER — OXYCODONE-ACETAMINOPHEN 5-325 MG PO TABS: 1.0000 | ORAL_TABLET | Freq: Every day | ORAL | 0 refills | Status: DC | PRN

## 2019-02-20 MED ORDER — CLONAZEPAM 0.5 MG PO TABS: 0.5000 mg | ORAL_TABLET | Freq: Every evening | ORAL | 4 refills | Status: DC

## 2019-02-20 NOTE — Telephone Encounter (Signed)
Refill request for:  oxyCODONE-acetaminophen (PERCOCET) 5-325 MG tablet  clonazePAM (KLONOPIN) 0.5 MG tablet    [facility]acy[id number] [employer] [employer], [place]

## 2019-02-20 NOTE — Addendum Note (Signed)
Addended by: Lucille Passy on: 02/20/2019 11:27 AM   Modules accepted: Orders

## 2019-02-20 NOTE — Telephone Encounter (Signed)
Dr. Deborra Medina please advise last filled 09/19/2018 #30 refills 4. Last OV 04/10/2018

## 2019-02-25 NOTE — Progress Notes (Addendum)
Subjective:   Patient ID: [name], female    DOB: 1970/09/04, 48 y.o.   MRN: [id number]  [name] is a pleasant 48 y.o. year old female who presents to clinic today with Anxiety (Pt agrees to virtual visit. She is needing to discuss her anxiety.  Per [place] PMP pt is compliant without red flags.  Update of UDS & CSC needed and communication created for completion at lab visit. )  on 02/26/2019       Virtual Visit via Video   Due to the COVID-19 pandemic, this visit was completed with telemedicine (audio/video) technology to reduce patient and provider exposure as well as to preserve personal protective equipment.   I connected with [name] by a video enabled telemedicine application and verified that I am speaking with the correct person using two identifiers. Location patient: Home Location provider: [facility], Office Persons participating in the virtual visit: ELMA SHANDS, Arnette Norris, MD   I discussed the limitations of evaluation and management by telemedicine and the availability of in person appointments. The patient expressed understanding and agreed to proceed.  Care Team   Patient Care Team: Lucille Passy, MD as PCP - General Hilty, Nadean Corwin, MD as PCP - Cardiology (Cardiology)  Subjective:   HPI:   Anxiety-  Currently on zoloft 100 mg daily and Klonipin as needed for anxiety.  In 03/2018, I placed her on zoloft for depression and hot flashes.  From reviewing chart, it appears we increased dose to 100 mg daily and she has not had it refilled since 04/10/18. She feels the zoloft is making her "numb."  She has been more anxious since covid started.  She is talking to her therapist once a week.  GAD 7 : Generalized Anxiety Score 02/26/2019 11/30/2017  Nervous, Anxious, on Edge 3 1  Control/stop worrying 3 1  Worry too much - different things 3 1  Trouble relaxing 3 1  Restless 2 2  Easily annoyed or irritable 1 1  Afraid - awful might happen 1 1   Total GAD 7 Score 16 8  Anxiety Difficulty Very difficult Not difficult at all   Depression screen [facility] 2/9 02/26/2019 11/30/2017 06/12/2017  Decreased Interest 2 1 0  Down, Depressed, Hopeless 0 1 0  PHQ - 2 Score 2 2 0  Altered sleeping 0 2 -  Tired, decreased energy 3 3 -  Change in appetite 3 3 -  Feeling bad or failure about yourself  0 0 -  Trouble concentrating 1 1 -  Moving slowly or fidgety/restless 2 0 -  Suicidal thoughts 0 0 -  PHQ-9 Score 11 11 -  Difficult doing work/chores Very difficult Not difficult at all -  Some recent data might be hidden   Fibromyalgia- Indication for chronic opioid: fibromyalgia/chronic pain Medication and dose: percocet 5/325- 1 tab by mouth daily as needed for severe pain # pills per month: 30 Last UDS date: 04/10/18 Opioid Treatment Agreement signed (Y/N): Y Opioid Treatment Agreement last reviewed with patient:  Y NCCSRS reviewed this encounter (include red flags):  Y- no red flags     Review of Systems  Constitutional: Negative.   Psychiatric/Behavioral: Negative for agitation, behavioral problems, confusion, decreased concentration, dysphoric mood, hallucinations, self-injury, sleep disturbance and suicidal ideas. The patient is nervous/anxious. The patient is not hyperactive.   All other systems reviewed and are negative.     Patient Active Problem List   Diagnosis Date Noted  .  Chronic chest wall pain 03/19/2019  . ESR raised 03/19/2019  . Polyarthralgia 03/19/2019  . S/P cervical spinal fusion 03/19/2019  . Depression with anxiety 04/10/2018  . Fatigue 04/10/2018  . Opioid type dependence, continuous ([place]) 11/30/2017  . Arthritis 07/15/2015  . Hot flashes 07/15/2015  . Shortness of breath 08/20/2013  . Cervical radicular pain 11/24/2011  . Fibromyalgia 04/07/2011  . HLD (hyperlipidemia) 05/05/2010  . GENITAL HERPES 01/22/2010  . INSOMNIA, CHRONIC 01/22/2010  . GERD 01/22/2010  . BULIMIA, HX OF 01/22/2010    Social  History   Tobacco Use  . Smoking status: Never Smoker  . Smokeless tobacco: Never Used  Substance Use Topics  . Alcohol use: No    Frequency: Never    Comment: occasional    Current Outpatient Medications:  .  clonazePAM (KLONOPIN) 0.5 MG tablet, Take 1 tablet (0.5 mg total) by mouth every evening., Disp: 30 tablet, Rfl: 5 .  cyclobenzaprine (FLEXERIL) 10 MG tablet, TAKE 1 TABLET BY MOUTH AT BEDTIME AS NEEDED MUSCLE SPASMS, Disp: 30 tablet, Rfl: 4 .  meclizine (ANTIVERT) 25 MG tablet, meclizine 25 mg tablet, Disp: , Rfl:  .  metoprolol succinate (TOPROL-XL) 25 MG 24 hr tablet, TAKE 1/2 TABLET BY MOUTH EVERY DAY, Disp: 45 tablet, Rfl: 7 .  oxyCODONE-acetaminophen (PERCOCET) 5-325 MG tablet, Take 1 tablet by mouth daily as needed for severe pain., Disp: 30 tablet, Rfl: 0 .  sertraline (ZOLOFT) 100 MG tablet, Take 100 mg by mouth daily., Disp: , Rfl:  .  simvastatin (ZOCOR) 10 MG tablet, TAKE 1 TABLET BY MOUTH AT BEDTIME, Disp: 90 tablet, Rfl: 2 .  valACYclovir (VALTREX) 500 MG tablet, Take 1 tablet (500 mg total) by mouth 2 (two) times daily as needed., Disp: 180 tablet, Rfl: 1 .  zolpidem (AMBIEN CR) 12.5 MG CR tablet, Take 1 tablet (12.5 mg total) by mouth at bedtime., Disp: 30 tablet, Rfl: 5 .  diclofenac sodium (VOLTAREN) 1 % GEL, Apply 4 g topically 4 (four) times daily., Disp: 100 g, Rfl: 1 .  DULoxetine (CYMBALTA) 30 MG capsule, Take 1 capsule (30 mg total) by mouth daily., Disp: 90 capsule, Rfl: 3 .  lansoprazole (PREVACID) 30 MG capsule, TAKE 1 CAPSULE BY MOUTH EVERY DAY, Disp: 90 capsule, Rfl: 2 .  Vitamin D, Ergocalciferol, (DRISDOL) 1.25 MG (50000 UT) CAPS capsule, Take 1 capsule (50,000 Units total) by mouth every 7 (seven) days., Disp: 12 capsule, Rfl: 0  Allergies  Allergen Reactions  . Codeine     REACTION: GI upset  . Doxycycline Nausea And Vomiting  . Neosporin [Neomycin-Bacitracin Zn-Polymyx]     Patient reported    Objective:   VITALS: Per patient if applicable,  see vitals. GENERAL: Alert, appears well and in no acute distress. HEENT: Atraumatic, conjunctiva clear, no obvious abnormalities on inspection of external nose and ears. NECK: Normal movements of the head and neck. CARDIOPULMONARY: No increased WOB. Speaking in clear sentences. I:E ratio WNL.  MS: Moves all visible extremities without noticeable abnormality. PSYCH: Pleasant and cooperative, well-groomed. Speech normal rate and rhythm. Affect is appropriate. Insight and judgement are appropriate. Attention is focused, linear, and appropriate.  NEURO: CN grossly intact. Oriented as arrived to appointment on time with no prompting. Moves both UE equally.  SKIN: No obvious lesions, wounds, erythema, or cyanosis noted on face or hands.  Depression screen [facility] 2/9 03/19/2019 02/26/2019 11/30/2017  Decreased Interest 0 2 1  Down, Depressed, Hopeless 0 0 1  PHQ - 2 Score 0 2  2  Altered sleeping - 0 2  Tired, decreased energy - 3 3  Change in appetite - 3 3  Feeling bad or failure about yourself  - 0 0  Trouble concentrating - 1 1  Moving slowly or fidgety/restless - 2 0  Suicidal thoughts - 0 0  PHQ-9 Score - 11 11  Difficult doing work/chores - Very difficult Not difficult at all  Some recent data might be hidden    Assessment and Plan:   Celestia was seen today for anxiety.  Diagnoses and all orders for this visit:  Depression with anxiety  Opioid type dependence, continuous (HCC) -     Pain Mgmt, Profile 8 w/Conf, U  Fibromyalgia -     Pain Mgmt, Profile 8 w/Conf, U  Other orders -     zolpidem (AMBIEN CR) 12.5 MG CR tablet; Take 1 tablet (12.5 mg total) by mouth at bedtime. -     clonazePAM (KLONOPIN) 0.5 MG tablet; Take 1 tablet (0.5 mg total) by mouth every evening. -     DULoxetine (CYMBALTA) 30 MG capsule; Take 1 capsule (30 mg total) by mouth daily.    Marland Kitchen COVID-19 Education: The signs and symptoms of COVID-19 were discussed with the patient and how to seek care for testing if  needed. The importance of social distancing was discussed today. . Reviewed expectations re: course of current medical issues. . Discussed self-management of symptoms. . Outlined signs and symptoms indicating need for more acute intervention. . Patient verbalized understanding and all questions were answered. Marland Kitchen Health Maintenance issues including appropriate healthy diet, exercise, and smoking avoidance were discussed with patient. . See orders for this visit as documented in the electronic medical record.  Arnette Norris, MD  Records requested if needed. Time spent: 25 minutes, of which >50% was spent in obtaining information about her symptoms, reviewing her previous labs, evaluations, and treatments, counseling her about her condition (please see the discussed topics above), and developing a plan to further investigate it; she had a number of questions which I addressed.    Anxiety-  Currently on zoloft 100 mg daily and Klonipin as needed for anxiety.  In 03/2018, I placed her on zoloft for depression and hot flashes.  From reviewing chart, it appears we increased dose to 100 mg daily and she has not had it refilled since 04/10/18. She feels the zoloft is making her "numb."  She has been more anxious since covid started.  She is talking to her therapist once a week.  GAD 7 : Generalized Anxiety Score 02/26/2019 11/30/2017  Nervous, Anxious, on Edge 3 1  Control/stop worrying 3 1  Worry too much - different things 3 1  Trouble relaxing 3 1  Restless 2 2  Easily annoyed or irritable 1 1  Afraid - awful might happen 1 1  Total GAD 7 Score 16 8  Anxiety Difficulty Very difficult Not difficult at all   Depression screen [facility] 2/9 02/26/2019 11/30/2017 06/12/2017  Decreased Interest 2 1 0  Down, Depressed, Hopeless 0 1 0  PHQ - 2 Score 2 2 0  Altered sleeping 0 2 -  Tired, decreased energy 3 3 -  Change in appetite 3 3 -  Feeling bad or failure about yourself  0 0 -  Trouble concentrating 1 1 -   Moving slowly or fidgety/restless 2 0 -  Suicidal thoughts 0 0 -  PHQ-9 Score 11 11 -  Difficult doing work/chores Very difficult Not difficult at all -  Some recent data might be hidden   Fibromyalgia- Indication for chronic opioid: fibromyalgia/chronic pain Medication and dose: percocet 5/325- 1 tab by mouth daily as needed for severe pain # pills per month: 30 Last UDS date: 04/10/18 Opioid Treatment Agreement signed (Y/N): Y Opioid Treatment Agreement last reviewed with patient:  Y NCCSRS reviewed this encounter (include red flags):  Y- no red flags     Review of Systems  Constitutional: Negative.   Psychiatric/Behavioral: Negative for agitation, behavioral problems, confusion, decreased concentration, dysphoric mood, hallucinations, self-injury, sleep disturbance and suicidal ideas. The patient is nervous/anxious. The patient is not hyperactive.   All other systems reviewed and are negative.      Objective:    There were no vitals taken for this visit.   Physical Exam Vitals signs and nursing note reviewed.  Constitutional:      Appearance: Normal appearance.  HENT:     Head: Normocephalic and atraumatic.     Right Ear: External ear normal.     Left Ear: External ear normal.     Nose: Nose normal.     Mouth/Throat:     Mouth: Mucous membranes are moist.  Eyes:     Extraocular Movements: Extraocular movements intact.  Neck:     Musculoskeletal: Normal range of motion.  Cardiovascular:     Rate and Rhythm: Normal rate.     Pulses: Normal pulses.  Pulmonary:     Effort: Pulmonary effort is normal.  Neurological:     General: No focal deficit present.     Mental Status: She is alert and oriented to person, place, and time.  Psychiatric:        Mood and Affect: Mood normal. Mood is not anxious, depressed or elated. Affect is flat. Affect is not labile, blunt, angry, tearful or inappropriate.        Speech: Speech normal.        Behavior: Behavior normal.         Cognition and Memory: Cognition and memory normal.           Assessment & Plan:   Opioid type dependence, continuous (HCC) - Plan: Pain Mgmt, Profile 8 w/Conf, U,   Fibromyalgia - Plan: Pain Mgmt, Profile 8 w/Conf, U, No follow-ups on file.

## 2019-02-26 ENCOUNTER — Telehealth (INDEPENDENT_AMBULATORY_CARE_PROVIDER_SITE_OTHER): Payer: BC Managed Care – PPO | Admitting: Family Medicine

## 2019-02-26 ENCOUNTER — Encounter: Payer: Self-pay | Admitting: Family Medicine

## 2019-02-26 DIAGNOSIS — F418 Other specified anxiety disorders: Secondary | ICD-10-CM | POA: Diagnosis not present

## 2019-02-26 DIAGNOSIS — F1120 Opioid dependence, uncomplicated: Secondary | ICD-10-CM

## 2019-02-26 DIAGNOSIS — M797 Fibromyalgia: Secondary | ICD-10-CM

## 2019-02-26 MED ORDER — ZOLPIDEM TARTRATE ER 12.5 MG PO TBCR: 12.5000 mg | EXTENDED_RELEASE_TABLET | Freq: Every day | ORAL | 5 refills | Status: DC

## 2019-02-26 MED ORDER — DULOXETINE HCL 30 MG PO CPEP: 30.0000 mg | ORAL_CAPSULE | Freq: Every day | ORAL | 3 refills | Status: AC

## 2019-02-26 MED ORDER — CLONAZEPAM 0.5 MG PO TABS: 0.5000 mg | ORAL_TABLET | Freq: Every evening | ORAL | 5 refills | Status: DC

## 2019-02-26 NOTE — Assessment & Plan Note (Signed)
>  25 minutes spent in face to face time with patient, >50% spent in counselling or coordination of care discussing anxiety, depression, Fibromyalgia.  She is going to continue psychotherapy.  She would like to try to wean off zoloft and try something that perhaps could benefit with all three- anxiety, depression and fibromyalgia.  We agreed on Starting Cymbalta while weaning off zoloft-the following as sent to her via my chart message.  Great to see to see you today.  Start weaning off zoloft- take 50 mg (1/2 tab) every other day for 1 week, then take 25 mg every other day for a week.  Then stop taking zoloft.  Go ahead and start Cymbalta 30 tomorrow and you can take that daily.  Please update me in 2-3 week  The patient indicates understanding of these issues and agrees with the plan.

## 2019-02-26 NOTE — Patient Instructions (Addendum)
Great to see to see you today.  Start weaning off zoloft- take 50 mg (1/2 tab) every other day for 1 week, then take 25 mg every other day for a week.  Then stop taking zoloft.  Go ahead and start Cymbalta 30 tomorrow and you can take that daily.  Please update me in 2-3 weeks.

## 2019-02-27 DIAGNOSIS — F331 Major depressive disorder, recurrent, moderate: Secondary | ICD-10-CM | POA: Diagnosis not present

## 2019-03-04 ENCOUNTER — Other Ambulatory Visit: Payer: Self-pay

## 2019-03-04 MED ORDER — LANSOPRAZOLE 30 MG PO CPDR: DELAYED_RELEASE_CAPSULE | ORAL | 2 refills | Status: DC

## 2019-03-06 ENCOUNTER — Encounter: Payer: Self-pay | Admitting: Family Medicine

## 2019-03-07 DIAGNOSIS — F331 Major depressive disorder, recurrent, moderate: Secondary | ICD-10-CM | POA: Diagnosis not present

## 2019-03-12 ENCOUNTER — Encounter: Payer: Self-pay | Admitting: Family Medicine

## 2019-03-12 ENCOUNTER — Other Ambulatory Visit: Payer: Self-pay

## 2019-03-12 ENCOUNTER — Telehealth (INDEPENDENT_AMBULATORY_CARE_PROVIDER_SITE_OTHER): Payer: BC Managed Care – PPO | Admitting: Family Medicine

## 2019-03-12 VITALS — Wt 188.0 lb

## 2019-03-12 DIAGNOSIS — M1990 Unspecified osteoarthritis, unspecified site: Secondary | ICD-10-CM

## 2019-03-12 DIAGNOSIS — M797 Fibromyalgia: Secondary | ICD-10-CM | POA: Diagnosis not present

## 2019-03-12 NOTE — Assessment & Plan Note (Signed)
>  25 minutes spent in face to face time with patient, >50% spent in counselling or coordination of care discussing arthritis and fibromyalgia. ? Possible psoriatic arthritis.  Will check labs. Orders entered. Advised to cut back on inflammatory foods- sugar, gluten, etc. The patient indicates understanding of these issues and agrees with the plan. Orders Placed This Encounter  Procedures  . Cyclic citrul peptide antibody, IgG ([facility])  . Sedimentation rate  . C-reactive protein  . Rheumatoid Factor

## 2019-03-12 NOTE — Progress Notes (Signed)
Virtual Visit via Video   Due to the COVID-19 pandemic, this visit was completed with telemedicine (audio/video) technology to reduce patient and provider exposure as well as to preserve personal protective equipment.   I connected with [name] by a video enabled telemedicine application and verified that I am speaking with the correct person using two identifiers. Location patient: Home Location provider: [facility], Office Persons participating in the virtual visit: [name], Arnette Norris, MD   I discussed the limitations of evaluation and management by telemedicine and the availability of in person appointments. The patient expressed understanding and agreed to proceed.  Care Team   Patient Care Team: Lucille Passy, MD as PCP - General Debara Pickett, Nadean Corwin, MD as PCP - Cardiology (Cardiology)  Subjective:   HPI:    H/o fibromyalgia and arthritis but lately, her arthritis is worse- hands, shoulders, right elbow and back and neck hurt. This has been going on for about a year and getting worse.  Does not typically get bone pain with fibromyalgia.  She has had rashes- scaly patch on foot on and on scalp.  Hot baths make it better.  Sugar does make it worse.  Feels very different from her fibromyalgia pain.  Also, noticing changes in her nail bed.  She is unsure if anyone had any other types of arthritis than osteoarthritis in her family.  Current Outpatient Medications on File Prior to Visit  Medication Sig Dispense Refill  . clonazePAM (KLONOPIN) 0.5 MG tablet Take 1 tablet (0.5 mg total) by mouth every evening. 30 tablet 5  . cyclobenzaprine (FLEXERIL) 10 MG tablet TAKE 1 TABLET BY MOUTH AT BEDTIME AS NEEDED MUSCLE SPASMS 30 tablet 4  . DULoxetine (CYMBALTA) 30 MG capsule Take 1 capsule (30 mg total) by mouth daily. 90 capsule 3  . lansoprazole (PREVACID) 30 MG capsule TAKE 1 CAPSULE BY MOUTH EVERY DAY 90 capsule 2  . meclizine (ANTIVERT) 25 MG tablet meclizine 25  mg tablet    . metoprolol succinate (TOPROL-XL) 25 MG 24 hr tablet TAKE 1/2 TABLET BY MOUTH EVERY DAY 45 tablet 7  . oxyCODONE-acetaminophen (PERCOCET) 5-325 MG tablet Take 1 tablet by mouth daily as needed for severe pain. 30 tablet 0  . sertraline (ZOLOFT) 100 MG tablet Take 100 mg by mouth daily.    . simvastatin (ZOCOR) 10 MG tablet TAKE 1 TABLET BY MOUTH AT BEDTIME 90 tablet 2  . valACYclovir (VALTREX) 500 MG tablet Take 1 tablet (500 mg total) by mouth 2 (two) times daily as needed. 180 tablet 1  . zolpidem (AMBIEN CR) 12.5 MG CR tablet Take 1 tablet (12.5 mg total) by mouth at bedtime. 30 tablet 5   No current facility-administered medications on file prior to visit.     Allergies  Allergen Reactions  . Codeine     REACTION: GI upset  . Doxycycline Nausea And Vomiting  . Neosporin [Neomycin-Bacitracin Zn-Polymyx]     Patient reported    Past Medical History:  Diagnosis Date  . Bulging disc    T7/8-receiving ESI's (Dr. Lynann Bologna)  . Bulimia nervosa    history  . Fibromyalgia    Dr. Estanislado Pandy  . Genital herpes     Past Surgical History:  Procedure Laterality Date  . ABDOMINAL HYSTERECTOMY     endometriosis  . CESAREAN SECTION    . NECK SURGERY    . PAROTIDECTOMY  2009   benign tumor  . ROTATOR CUFF REPAIR  Family History  Problem Relation Age of Onset  . Lung cancer Mother   . Stroke Father   . Breast cancer Maternal Aunt [id number]    Social History   Socioeconomic History  . Marital status: Divorced    Spouse name: Not on file  . Number of children: 1  . Years of education: Not on file  . Highest education level: Not on file  Occupational History  . Occupation: Building surveyorT project manager  Social Needs  . Financial resource strain: Not on file  . Food insecurity    Worry: Not on file    Inability: Not on file  . Transportation needs    Medical: Not on file    Non-medical: Not on file  Tobacco Use  . Smoking status: Never Smoker  . Smokeless tobacco: Never  Used  Substance and Sexual Activity  . Alcohol use: No    Frequency: Never    Comment: occasional  . Drug use: No  . Sexual activity: Not on file  Lifestyle  . Physical activity    Days per week: Not on file    Minutes per session: Not on file  . Stress: Not on file  Relationships  . Social Musicianconnections    Talks on phone: Not on file    Gets together: Not on file    Attends religious service: Not on file    Active member of club or organization: Not on file    Attends meetings of clubs or organizations: Not on file    Relationship status: Not on file  . Intimate partner violence    Fear of current or ex partner: Not on file    Emotionally abused: Not on file    Physically abused: Not on file    Forced sexual activity: Not on file  Other Topics Concern  . Not on file  Social History Narrative   Regular exercise-yes      Plays softball regularly      Has 1 daughter       [place]      IT Emergency planning/management officerproject manager for [employer]              Review of Systems  Constitutional: Positive for malaise/fatigue.  HENT: Negative.   Gastrointestinal: Negative.   Genitourinary: Negative.   Musculoskeletal: Positive for joint pain and myalgias.  Skin: Positive for rash.  Neurological: Negative.   Endo/Heme/Allergies: Negative.   Psychiatric/Behavioral: Negative.   All other systems reviewed and are negative.    Patient Active Problem List   Diagnosis Date Noted  . Depression with anxiety 04/10/2018  . Fatigue 04/10/2018  . Opioid type dependence, continuous (HCC) 11/30/2017  . Arthritis 07/15/2015  . Hot flashes 07/15/2015  . Shortness of breath 08/20/2013  . Cervical pain (neck) 11/24/2011  . Fibromyalgia 04/07/2011  . HLD (hyperlipidemia) 05/05/2010  . GENITAL HERPES 01/22/2010  . INSOMNIA, CHRONIC 01/22/2010  . GERD 01/22/2010  . BULIMIA, HX OF 01/22/2010    Social History   Tobacco Use  . Smoking status: Never Smoker  . Smokeless tobacco: Never Used   Substance Use Topics  . Alcohol use: No    Frequency: Never    Comment: occasional    Current Outpatient Medications:  .  clonazePAM (KLONOPIN) 0.5 MG tablet, Take 1 tablet (0.5 mg total) by mouth every evening., Disp: 30 tablet, Rfl: 5 .  cyclobenzaprine (FLEXERIL) 10 MG tablet, TAKE 1 TABLET BY MOUTH AT BEDTIME AS NEEDED MUSCLE SPASMS, Disp: 30  tablet, Rfl: 4 .  DULoxetine (CYMBALTA) 30 MG capsule, Take 1 capsule (30 mg total) by mouth daily., Disp: 90 capsule, Rfl: 3 .  lansoprazole (PREVACID) 30 MG capsule, TAKE 1 CAPSULE BY MOUTH EVERY DAY, Disp: 90 capsule, Rfl: 2 .  meclizine (ANTIVERT) 25 MG tablet, meclizine 25 mg tablet, Disp: , Rfl:  .  metoprolol succinate (TOPROL-XL) 25 MG 24 hr tablet, TAKE 1/2 TABLET BY MOUTH EVERY DAY, Disp: 45 tablet, Rfl: 7 .  oxyCODONE-acetaminophen (PERCOCET) 5-325 MG tablet, Take 1 tablet by mouth daily as needed for severe pain., Disp: 30 tablet, Rfl: 0 .  sertraline (ZOLOFT) 100 MG tablet, Take 100 mg by mouth daily., Disp: , Rfl:  .  simvastatin (ZOCOR) 10 MG tablet, TAKE 1 TABLET BY MOUTH AT BEDTIME, Disp: 90 tablet, Rfl: 2 .  valACYclovir (VALTREX) 500 MG tablet, Take 1 tablet (500 mg total) by mouth 2 (two) times daily as needed., Disp: 180 tablet, Rfl: 1 .  zolpidem (AMBIEN CR) 12.5 MG CR tablet, Take 1 tablet (12.5 mg total) by mouth at bedtime., Disp: 30 tablet, Rfl: 5  Allergies  Allergen Reactions  . Codeine     REACTION: GI upset  . Doxycycline Nausea And Vomiting  . Neosporin [Neomycin-Bacitracin Zn-Polymyx]     Patient reported    Objective:  Wt 188 lb (85.3 kg)   BMI 31.28 kg/m   VITALS: Per patient if applicable, see vitals. GENERAL: Alert, appears well and in no acute distress. HEENT: Atraumatic, conjunctiva clear, no obvious abnormalities on inspection of external nose and ears. NECK: Normal movements of the head and neck. CARDIOPULMONARY: No increased WOB. Speaking in clear sentences. I:E ratio WNL.  MS: Moves all  visible extremities without noticeable abnormality. PSYCH: Pleasant and cooperative, well-groomed. Speech normal rate and rhythm. Affect is appropriate. Insight and judgement are appropriate. Attention is focused, linear, and appropriate.  NEURO: CN grossly intact. Oriented as arrived to appointment on time with no prompting. Moves both UE equally.  SKIN: small, dry scaly lesion on anterior aspect of right foot  [facility] 02/26/2019 11/30/2017 06/12/2017  Decreased Interest 2 1 0  Down, Depressed, Hopeless 0 1 0  PHQ - 2 Score 2 2 0  Altered sleeping 0 2 -  Tired, decreased energy 3 3 -  Change in appetite 3 3 -  Feeling bad or failure about yourself  0 0 -  Trouble concentrating 1 1 -  Moving slowly or fidgety/restless 2 0 -  Suicidal thoughts 0 0 -  PHQ-9 Score 11 11 -  Difficult doing work/chores Very difficult Not difficult at all -  Some recent data might be hidden    Assessment and Plan:   There are no diagnoses linked to this encounter.  Marland Kitchen. COVID-19 Education: The signs and symptoms of COVID-19 were discussed with the patient and how to seek care for testing if needed. The importance of social distancing was discussed today. . Reviewed expectations re: course of current medical issues. . Discussed self-management of symptoms. . Outlined signs and symptoms indicating need for more acute intervention. . Patient verbalized understanding and all questions were answered. Marland Kitchen. Health Maintenance issues including appropriate healthy diet, exercise, and smoking avoidance were discussed with patient. . See orders for this visit as documented in the electronic medical record.  Ruthe Mannanalia Aron, MD  Records requested if needed. Time spent: 25 minutes, of which >50% was spent in obtaining information about her symptoms, reviewing her previous labs, evaluations, and treatments, counseling her  about her condition (please see the discussed topics above), and developing a plan to  further investigate it; she had a number of questions which I addressed.

## 2019-03-14 DIAGNOSIS — F331 Major depressive disorder, recurrent, moderate: Secondary | ICD-10-CM | POA: Diagnosis not present

## 2019-03-18 ENCOUNTER — Other Ambulatory Visit: Payer: Self-pay

## 2019-03-18 ENCOUNTER — Other Ambulatory Visit (INDEPENDENT_AMBULATORY_CARE_PROVIDER_SITE_OTHER): Payer: BC Managed Care – PPO

## 2019-03-18 DIAGNOSIS — E559 Vitamin D deficiency, unspecified: Secondary | ICD-10-CM | POA: Diagnosis not present

## 2019-03-18 DIAGNOSIS — M1990 Unspecified osteoarthritis, unspecified site: Secondary | ICD-10-CM

## 2019-03-18 LAB — VITAMIN D 25 HYDROXY (VIT D DEFICIENCY, FRACTURES): VITD: 25.66 ng/mL — ABNORMAL LOW (ref 30.00–100.00)

## 2019-03-18 LAB — C-REACTIVE PROTEIN: CRP: 1.4 mg/dL (ref 0.5–20.0)

## 2019-03-18 LAB — SEDIMENTATION RATE: Sed Rate: 42 mm/hr — ABNORMAL HIGH (ref 0–20)

## 2019-03-18 NOTE — Progress Notes (Signed)
Patient's Name: [name]  MRN: [id number]  Referring Provider: Lucille Passy, MD  DOB: 08-25-1970  PCP: Lucille Passy, MD  DOS: 03/19/2019  Note by: Gillis Santa, MD  Service setting: Ambulatory outpatient  Specialty: Interventional Pain Management  Location: [facility] (AMB) Pain Management Facility  Visit type: Initial Patient Evaluation  Patient type: New Patient   Primary Reason(s) for Visit: Encounter for initial evaluation of one or more chronic problems (new to examiner) potentially causing chronic pain, and posing a threat to normal musculoskeletal function. (Level of risk: High) CC: Chest Pain, Shoulder Pain, Hand Pain, Foot Pain, and Back Pain  HPI  Ms. Scantling is a 48 y.o. year old, female patient, who comes today to see [place] for the first time for an initial evaluation of her chronic pain. She has GENITAL HERPES; HLD (hyperlipidemia); INSOMNIA, CHRONIC; GERD; BULIMIA, HX OF; Fibromyalgia; Cervical radicular pain; Shortness of breath; Arthritis; Hot flashes; Opioid type dependence, continuous ([place]); Depression with anxiety; Fatigue; Chronic chest wall pain; ESR raised; Polyarthralgia; and S/P cervical spinal fusion on their problem list. Today she comes in for evaluation of her Chest Pain, Shoulder Pain, Hand Pain, Foot Pain, and Back Pain  Pain Assessment: Location: Mid Chest Radiating: radiates around to back Onset: More than a month ago Duration: Chronic pain Quality: Sharp, Discomfort, Pressure Severity: 2 /10 (subjective, self-reported pain score)  Note: Reported level is compatible with observation.                         When using our objective Pain Scale, levels between 6 and 10/10 are said to belong in an emergency room, as it progressively worsens from a 6/10, described as severely limiting, requiring emergency care not usually available at an outpatient pain management facility. At a 6/10 level, communication becomes difficult and requires great effort. Assistance to reach the  emergency department may be required. Facial flushing and profuse sweating along with potentially dangerous increases in heart rate and blood pressure will be evident. Effect on ADL: "Limits activities" Timing: Intermittent Modifying factors: lying still BP: 114/86  HR: 84  Onset and Duration: Gradual and Date of onset: 2019 Cause of pain: Unknown Severity: Getting worse, NAS-11 at its worse: 2/10, NAS-11 at its best: 3/10, NAS-11 now: 1/10 and NAS-11 on the average: 3/10 Timing: Not influenced by the time of the day Aggravating Factors: Prolonged sitting, Prolonged standing and weather,stress,food Alleviating Factors: Hot packs, Lying down and Medications Associated Problems: Depression, Fatigue, Numbness, Spasms and Pain that does not allow patient to sleep Quality of Pain: Aching, Annoying, Burning, Constant, Dull, Nagging, Throbbing and Tingling Previous Examinations or Tests: none Previous Treatments: Epidural steroid injections, Narcotic medications and Steroid treatments by mouth  The patient comes into the clinics today for the first time for a chronic pain management evaluation.   48 year old female with a history of cervical degenerative disc disease, status post C3-C4 ACDF, fibromyalgia who presents with a chief complaint of anterior chest wall pain.  This is been present since 2019.  No inciting or traumatic event.  Patient has had an extensive cardiac work-up which was negative.  EKG was unremarkable.  Patient endorses tightness and throbbing dull chest pain.  It is worse with inspiration and with coughing.  Patient denies loss of consciousness, shortness of breath.  We did discuss her dietary habits and she states that they are not good.  She eats a diet that is high in processed foods and dairy.  Patient has had epidural steroid injections done in her neck prior to her cervical spine surgery.    She is currently on Ambien, Klonopin, Percocet prescribed by her primary care  provider.  Historic Controlled Substance Pharmacotherapy Review   02/20/2019  1   02/20/2019  Oxycodone-Acetaminophen 5-325  30.00 30 Ta Aro   [id number]   [place] ([id number])   0  7.50 MME  Comm Ins   [place]    Pharmacodynamics: Desired effects: Analgesia: The patient reports <50% benefit. Reported improvement in function: The patient reports medication allows her to accomplish basic ADLs. Clinically meaningful improvement in function (CMIF): Sustained CMIF goals met Perceived effectiveness: Described as relatively effective, allowing for increase in activities of daily living (ADL) Undesirable effects: Side-effects or Adverse reactions: None reported Historical Monitoring: The patient  reports no history of drug use. List of all UDS Test(s): No results found for: MDMA, COCAINSCRNUR, [place], [place], CANNABQUANT, [place], [place] List of other Serum/Urine Drug Screening Test(s):  No results found for: AMPHSCRSER, BARBSCRSER, BENZOSCRSER, COCAINSCRSER, COCAINSCRNUR, PCPSCRSER, PCPQUANT, THCSCRSER, THCU, CANNABQUANT, OPIATESCRSER, OXYSCRSER, PROPOXSCRSER, ETH Historical Background Evaluation: [place] PMP: PDMP not reviewed this encounter. Six (6) year initial data search conducted.             [place] Department of public safety, offender search: Editor, commissioning Information) Non-contributory Risk Assessment Profile: Aberrant behavior: None observed or detected today Risk factors for fatal opioid overdose: age 66-35 years old, Benzodiazepine use and caucasian Fatal overdose hazard ratio (HR): Calculation deferred Non-fatal overdose hazard ratio (HR): Calculation deferred Risk of opioid abuse or dependence: 0.7-3.0% with doses ? 36 MME/day and 6.1-26% with doses ? 120 MME/day. Substance use disorder (SUD) risk level: See below Personal History of Substance Abuse (SUD-Substance use disorder):  Alcohol: Negative  Illegal Drugs: Negative  Rx Drugs: Negative  ORT Risk Level calculation: Moderate Risk Opioid Risk Tool -  03/19/19 0953      Family History of Substance Abuse   Alcohol  Positive Female    Illegal Drugs  Negative    Rx Drugs  Negative      Personal History of Substance Abuse   Alcohol  Negative    Illegal Drugs  Negative    Rx Drugs  Negative      Age   Age between 59-45 years   No      History of Preadolescent Sexual Abuse   History of Preadolescent Sexual Abuse  Negative or Female      Psychological Disease   Psychological Disease  Positive   ptsd   Depression  Positive      Total Score   Opioid Risk Tool Scoring  4    Opioid Risk Interpretation  Moderate Risk      ORT Scoring interpretation table:  Score <3 = Low Risk for SUD  Score between 4-7 = Moderate Risk for SUD  Score >8 = High Risk for Opioid Abuse   PHQ-2 Depression Scale:  Total score: 0  PHQ-2 Scoring interpretation table: (Score and probability of major depressive disorder)  Score 0 = No depression  Score 1 = 15.4% Probability  Score 2 = 21.1% Probability  Score 3 = 38.4% Probability  Score 4 = 45.5% Probability  Score 5 = 56.4% Probability  Score 6 = 78.6% Probability   PHQ-9 Depression Scale:  Total score: 0  PHQ-9 Scoring interpretation table:  Score 0-4 = No depression  Score 5-9 = Mild depression  Score 10-14 = Moderate depression  Score 15-19 = Moderately severe depression  Score 20-27 = Severe depression (2.4 times higher risk of SUD and 2.89 times higher risk of overuse)   Pharmacologic Plan: Non-opioid analgesic therapy offered.            Initial impression: Continue medication management with PCP  Meds   Current Outpatient Medications:  .  clonazePAM (KLONOPIN) 0.5 MG tablet, Take 1 tablet (0.5 mg total) by mouth every evening., Disp: 30 tablet, Rfl: 5 .  cyclobenzaprine (FLEXERIL) 10 MG tablet, TAKE 1 TABLET BY MOUTH AT BEDTIME AS NEEDED MUSCLE SPASMS, Disp: 30 tablet, Rfl: 4 .  DULoxetine (CYMBALTA) 30 MG capsule, Take 1 capsule (30 mg total) by mouth daily., Disp: 90 capsule, Rfl:  3 .  lansoprazole (PREVACID) 30 MG capsule, TAKE 1 CAPSULE BY MOUTH EVERY DAY, Disp: 90 capsule, Rfl: 2 .  metoprolol succinate (TOPROL-XL) 25 MG 24 hr tablet, TAKE 1/2 TABLET BY MOUTH EVERY DAY, Disp: 45 tablet, Rfl: 7 .  oxyCODONE-acetaminophen (PERCOCET) 5-325 MG tablet, Take 1 tablet by mouth daily as needed for severe pain., Disp: 30 tablet, Rfl: 0 .  simvastatin (ZOCOR) 10 MG tablet, TAKE 1 TABLET BY MOUTH AT BEDTIME, Disp: 90 tablet, Rfl: 2 .  valACYclovir (VALTREX) 500 MG tablet, Take 1 tablet (500 mg total) by mouth 2 (two) times daily as needed., Disp: 180 tablet, Rfl: 1 .  zolpidem (AMBIEN CR) 12.5 MG CR tablet, Take 1 tablet (12.5 mg total) by mouth at bedtime., Disp: 30 tablet, Rfl: 5 .  diclofenac sodium (VOLTAREN) 1 % GEL, Apply 4 g topically 4 (four) times daily., Disp: 100 g, Rfl: 1 .  meclizine (ANTIVERT) 25 MG tablet, meclizine 25 mg tablet, Disp: , Rfl:  .  sertraline (ZOLOFT) 100 MG tablet, Take 100 mg by mouth daily., Disp: , Rfl:   Imaging Review  Cervical Imaging: Cervical MR wo contrast:  Results for orders placed in visit on 12/14/11  MR Cervical Spine Wo Contrast    Results for orders placed during the hospital encounter of 02/26/10  DG Epidurography   Narrative Clinical Data:  Spondylosis without myelopathy.  Moderate improvement from the initial injections but with some persistent pain in the right scapular region.   Fluoroscopy Time: 53 seconds   Thoracic EPIDURAL INJECTION   The overlying skin was scrubbed with Betadine and draped in sterile fashion.  Skin anesthesia was carried out using 1% Lidocaine. An interlaminar approach was performed on the right at T7-8. A 20 gauge Crawford epidural needle was advanced using loss-of- resistance technique.   DIAGNOSTIC EPIDURAL INJECTION   Injection of Omnipaque 300 shows a good epidural pattern with spread above and below the level of needle placement, primarily on the right. No vascular opacification is  seen.   THERAPEUTIC EPIDURAL INJECTION   1.5 cc of Kenalog 40 mixed with 1 cc of 1% Lidocaine and 2 cc of normal saline were then instilled.  The procedure was well- tolerated, and the patient was discharged thirty minutes following the injection in good condition.   IMPRESSION: Technically successful third epidural injection on the right at T7- 8  Provider: Dawayne Cirri    Ankle Imaging: Ankle-R DG Complete:  Results for orders placed during the hospital encounter of 10/27/12  DG Ankle Complete Right   Narrative *RADIOLOGY REPORT*  Clinical Data: Fall.  Lateral ankle pain and swelling.  RIGHT ANKLE - COMPLETE 3+ VIEW  Comparison: None.  Findings: Moderate soft tissue swelling is seen overlying the lateral malleolus.  No evidence of fracture or dislocation.  No evidence  of ankle joint effusion.  No other significant bone abnormality identified.  IMPRESSION: Lateral soft tissue swelling.  No evidence of fracture.   Original Report Authenticated By: Earle Gell, M.D.     Foot-L DG Complete:  Results for orders placed during the hospital encounter of 07/24/17  DG Foot Complete Left   Narrative CLINICAL DATA:  Spontaneous lateral foot pain.  EXAM: LEFT FOOT - COMPLETE 3+ VIEW  COMPARISON:  12/22/2015  FINDINGS: Plantar calcaneal spur with a vertical linear lucency concerning for nondisplaced fracture through the enthesophyte which is new compared with 12/22/2015. No other acute fracture or dislocation. No aggressive osseous lesion.  IMPRESSION: 1. Plantar calcaneal spur with a vertical linear lucency concerning for nondisplaced fracture through the enthesophyte which is new compared with 12/22/2015.   Electronically Signed   By: Kathreen Devoid   On: 07/24/2017 13:59    Wrist-L DG Complete:  Results for orders placed during the hospital encounter of 03/02/10  DG Wrist Complete Left   Narrative Clinical Data: Wrist injury and pain.   LEFT WRIST -  COMPLETE 3+ VIEW   Comparison:  None.   Findings:  There is no evidence of fracture or dislocation.  There is no evidence of arthropathy or other focal bone abnormality. Soft tissues are unremarkable.   IMPRESSION: Negative.  Provider: Felipa Eth   Complexity Note: Imaging results reviewed. Results shared with Ms. Beagle, using Layman's terms.                         ROS  Cardiovascular: Abnormal heart rhythm and Chest pain Pulmonary or Respiratory: Snoring  Neurological: No reported neurological signs or symptoms such as seizures, abnormal skin sensations, urinary and/or fecal incontinence, being born with an abnormal open spine and/or a tethered spinal cord Review of Past Neurological Studies: No results found for this or any previous visit. Psychological-Psychiatric: Anxiousness, Depressed and Prone to panicking Gastrointestinal: Reflux or heatburn Genitourinary: No reported renal or genitourinary signs or symptoms such as difficulty voiding or producing urine, peeing blood, non-functioning kidney, kidney stones, difficulty emptying the bladder, difficulty controlling the flow of urine, or chronic kidney disease Hematological: No reported hematological signs or symptoms such as prolonged bleeding, low or poor functioning platelets, bruising or bleeding easily, hereditary bleeding problems, low energy levels due to low hemoglobin or being anemic Endocrine: No reported endocrine signs or symptoms such as high or low blood sugar, rapid heart rate due to high thyroid levels, obesity or weight gain due to slow thyroid or thyroid disease Rheumatologic: Rheumatoid arthritis and Generalized muscle aches (Fibromyalgia) Musculoskeletal: Negative for myasthenia gravis, muscular dystrophy, multiple sclerosis or malignant hyperthermia Work History: Working full time  Allergies  Ms. Martinek is allergic to codeine; doxycycline; and neosporin [neomycin-bacitracin zn-polymyx].  Laboratory  Chemistry Profile   Screening Lab Results  Component Value Date   STAPHAUREUS  05/26/2010    NEGATIVE        The Xpert SA Assay (FDA approved for NASAL specimens only), is one component of a comprehensive surveillance program.  It is not intended to diagnose infection nor to guide or monitor treatment.   MRSAPCR NEGATIVE 05/26/2010    Inflammation (CRP: Acute Phase) (ESR: Chronic Phase) Lab Results  Component Value Date   CRP 1.4 03/18/2019   ESRSEDRATE 42 (H) 03/18/2019                         Rheumatology Lab Results  Component  Value Date   RF <14 03/18/2019                        Renal Lab Results  Component Value Date   BUN 11 06/12/2017   CREATININE 0.74 06/12/2017   GFRAA >60 10/24/2011   GFRNONAA >60 10/24/2011                             Hepatic Lab Results  Component Value Date   AST 20 06/12/2017   ALT 16 06/12/2017   ALBUMIN 4.4 06/12/2017   ALKPHOS 61 06/12/2017                        Electrolytes Lab Results  Component Value Date   NA 139 06/12/2017   K 4.1 06/12/2017   CL 102 06/12/2017   CALCIUM 9.2 06/12/2017                        Neuropathy Lab Results  Component Value Date   PYKDXIPJ82 505 04/10/2018   HGBA1C 6.0 05/10/2010                        CNS No results found for: COLORCSF, APPEARCSF, RBCCOUNTCSF, WBCCSF, POLYSCSF, LYMPHSCSF, EOSCSF, PROTEINCSF, GLUCCSF, JCVIRUS, CSFOLI, IGGCSF, LABACHR, ACETBL                      Bone Lab Results  Component Value Date   VD25OH 25.66 (L) 03/18/2019                         Coagulation Lab Results  Component Value Date   PLT 419.0 (H) 04/10/2018                        Cardiovascular Lab Results  Component Value Date   HGB 14.2 04/10/2018   HCT 41.4 04/10/2018                         ID Lab Results  Component Value Date   STAPHAUREUS  05/26/2010    NEGATIVE        The Xpert SA Assay (FDA approved for NASAL specimens only), is one component of a comprehensive  surveillance program.  It is not intended to diagnose infection nor to guide or monitor treatment.   MRSAPCR NEGATIVE 05/26/2010    Cancer No results found for: CEA, CA125, LABCA2                      Endocrine Lab Results  Component Value Date   TSH 0.71 04/10/2018   FREET4 0.74 04/07/2011                        Note: Lab results reviewed.  PFSH  Drug: Ms. Sonnier  reports no history of drug use. Alcohol:  reports no history of alcohol use. Tobacco:  reports that she has never smoked. She has never used smokeless tobacco. Medical:  has a past medical history of Bulging disc, Bulimia nervosa, Fibromyalgia, and Genital herpes. Family: family history includes Breast cancer (age of onset: 70) in her maternal aunt; Lung cancer in her mother; Stroke in her father.  Past Surgical History:  Procedure Laterality Date  .  ABDOMINAL HYSTERECTOMY     endometriosis  . CESAREAN SECTION    . NECK SURGERY    . PAROTIDECTOMY  2009   benign tumor  . ROTATOR CUFF REPAIR     Active Ambulatory Problems    Diagnosis Date Noted  . GENITAL HERPES 01/22/2010  . HLD (hyperlipidemia) 05/05/2010  . INSOMNIA, CHRONIC 01/22/2010  . GERD 01/22/2010  . BULIMIA, HX OF 01/22/2010  . Fibromyalgia 04/07/2011  . Cervical radicular pain 11/24/2011  . Shortness of breath 08/20/2013  . Arthritis 07/15/2015  . Hot flashes 07/15/2015  . Opioid type dependence, continuous ([place]) 11/30/2017  . Depression with anxiety 04/10/2018  . Fatigue 04/10/2018  . Chronic chest wall pain 03/19/2019  . ESR raised 03/19/2019  . Polyarthralgia 03/19/2019  . S/P cervical spinal fusion 03/19/2019   Resolved Ambulatory Problems    Diagnosis Date Noted  . STREP THROAT 02/09/2010  . HERNIATED LUMBOSACRAL DISC 01/22/2010  . SYNCOPE 02/09/2010  . FATIGUE 01/22/2010  . Fatigue 04/07/2011  . Difficulty concentrating 06/07/2011  . Pyelonephritis 09/20/2011  . Dysuria 11/24/2011  . Neck pain 11/24/2011  . Dysentery  09/06/2012  . Vertigo 09/21/2012  . Acute sinusitis 01/18/2013  . Tick bite 01/18/2013  . Finger injury 05/10/2013  . Benign paroxysmal positional vertigo 07/15/2015  . Pain of left heel 12/22/2015  . Well woman exam 04/28/2016  . Palpitations 04/10/2018  . Chest pressure 05/29/2018   Past Medical History:  Diagnosis Date  . Bulging disc   . Bulimia nervosa   . Genital herpes    Constitutional Exam  General appearance: Well nourished, well developed, and well hydrated. In no apparent acute distress Vitals:   03/19/19 0944  BP: 114/86  Pulse: 84  Resp: 18  Temp: 98.3 F (36.8 C)  SpO2: 98%  Weight: 190 lb (86.2 kg)  Height: _0  (1.651 m)   BMI Assessment: Estimated body mass index is 31.62 kg/m as calculated from the following:   Height as of this encounter: _1  (1.651 m).   Weight as of this encounter: 190 lb (86.2 kg).  BMI interpretation table: BMI level Category Range association with higher incidence of chronic pain  <18 kg/m2 Underweight   18.5-24.9 kg/m2 Ideal body weight   25-29.9 kg/m2 Overweight Increased incidence by 20%  30-34.9 kg/m2 Obese (Class I) Increased incidence by 68%  35-39.9 kg/m2 Severe obesity (Class II) Increased incidence by 136%  >40 kg/m2 Extreme obesity (Class III) Increased incidence by 254%   Patient's current BMI Ideal Body weight  Body mass index is 31.62 kg/m. Ideal body weight: 57 kg (125 lb 10.6 oz) Adjusted ideal body weight: 68.7 kg (151 lb 6.4 oz)   BMI Readings from Last 4 Encounters:  03/19/19 31.62 kg/m  03/12/19 31.28 kg/m  07/27/18 32.28 kg/m  06/26/18 32.18 kg/m   Wt Readings from Last 4 Encounters:  03/19/19 190 lb (86.2 kg)  03/12/19 188 lb (85.3 kg)  07/27/18 194 lb (88 kg)  06/26/18 193 lb 6.4 oz (87.7 kg)  Psych/Mental status: Alert, oriented x 3 (person, place, & time)       Eyes: PERLA Respiratory: No evidence of acute respiratory distress  Cervical Spine Area Exam  Skin & Axial Inspection:  Well healed scar from previous spine surgery detected Alignment: Symmetrical Functional ROM: Unrestricted ROM      Stability: No instability detected Muscle Tone/Strength: Functionally intact. No obvious neuro-muscular anomalies detected. Sensory (Neurological): Dermatomal pain pattern and MSK Palpation: No palpable anomalies  Upper Extremity (UE) Exam    Side: Right upper extremity  Side: Left upper extremity  Skin & Extremity Inspection: Skin color, temperature, and hair growth are WNL. No peripheral edema or cyanosis. No masses, redness, swelling, asymmetry, or associated skin lesions. No contractures.  Skin & Extremity Inspection: Skin color, temperature, and hair growth are WNL. No peripheral edema or cyanosis. No masses, redness, swelling, asymmetry, or associated skin lesions. No contractures.  Functional ROM: Unrestricted ROM          Functional ROM: Unrestricted ROM          Muscle Tone/Strength: Functionally intact. No obvious neuro-muscular anomalies detected.  Muscle Tone/Strength: Functionally intact. No obvious neuro-muscular anomalies detected.  Sensory (Neurological): Unimpaired          Sensory (Neurological): Unimpaired          Palpation: No palpable anomalies              Palpation: No palpable anomalies              Provocative Test(s):  Phalen's test: deferred Tinel's test: deferred Apley's scratch test (touch opposite shoulder):  Action 1 (Across chest): deferred Action 2 (Overhead): deferred Action 3 (LB reach): deferred   Provocative Test(s):  Phalen's test: deferred Tinel's test: deferred Apley's scratch test (touch opposite shoulder):  Action 1 (Across chest): deferred Action 2 (Overhead): deferred Action 3 (LB reach): deferred    Thoracic Spine Area Exam  Skin & Axial Inspection: No masses, redness, or swelling Alignment: Symmetrical Functional ROM: Unrestricted ROM Stability: No instability detected Muscle Tone/Strength: Functionally  intact. No obvious neuro-muscular anomalies detected. Sensory (Neurological): Unimpaired Muscle strength & Tone: No palpable anomalies  Lumbar Spine Area Exam  Skin & Axial Inspection: No masses, redness, or swelling Alignment: Symmetrical Functional ROM: Unrestricted ROM       Stability: No instability detected Muscle Tone/Strength: Functionally intact. No obvious neuro-muscular anomalies detected. Sensory (Neurological): Unimpaired Palpation: No palpable anomalies       Provocative Tests: Hyperextension/rotation test: deferred today       Lumbar quadrant test (Kemp's test): deferred today       Lateral bending test: deferred today       Patrick's Maneuver: deferred today                   FABER* test: deferred today                   S-I anterior distraction/compression test: deferred today         S-I lateral compression test: deferred today         S-I Thigh-thrust test: deferred today         S-I Gaenslen's test: deferred today         *(Flexion, ABduction and External Rotation)  Gait & Posture Assessment  Ambulation: Unassisted Gait: Relatively normal for age and body habitus Posture: WNL   Lower Extremity Exam    Side: Right lower extremity  Side: Left lower extremity  Stability: No instability observed          Stability: No instability observed          Skin & Extremity Inspection: Skin color, temperature, and hair growth are WNL. No peripheral edema or cyanosis. No masses, redness, swelling, asymmetry, or associated skin lesions. No contractures.  Skin & Extremity Inspection: Skin color, temperature, and hair growth are WNL. No peripheral edema or cyanosis. No masses, redness, swelling, asymmetry, or associated skin lesions. No contractures.  Functional ROM: Unrestricted ROM                  Functional ROM: Unrestricted ROM                  Muscle Tone/Strength: Functionally intact. No obvious neuro-muscular anomalies detected.  Muscle Tone/Strength: Functionally intact.  No obvious neuro-muscular anomalies detected.  Sensory (Neurological): Unimpaired        Sensory (Neurological): Unimpaired        DTR: Patellar: deferred today Achilles: deferred today Plantar: deferred today  DTR: Patellar: deferred today Achilles: deferred today Plantar: deferred today  Palpation: No palpable anomalies  Palpation: No palpable anomalies   Assessment  Primary Diagnosis & Pertinent Problem List: The primary encounter diagnosis was Fibromyalgia. Diagnoses of Chronic chest wall pain, ESR raised, Polyarthralgia, Cervical radicular pain, and S/P cervical spinal fusion were also pertinent to this visit.  Visit Diagnosis (New problems to examiner): 1. Fibromyalgia   2. Chronic chest wall pain   3. ESR raised   4. Polyarthralgia   5. Cervical radicular pain   6. S/P cervical spinal fusion    General Recommendations: The pain condition that the patient suffers from is best treated with a multidisciplinary approach that involves an increase in physical activity to prevent de-conditioning and worsening of the pain cycle, as well as psychological counseling (formal and/or informal) to address the co-morbid psychological affects of pain. Treatment will often involve judicious use of pain medications and interventional procedures to decrease the pain, allowing the patient to participate in the physical activity that will ultimately produce long-lasting pain reductions. The goal of the multidisciplinary approach is to return the patient to a higher level of overall function and to restore their ability to perform activities of daily living.  Plan of Care (Initial workup plan)  I had extensive discussion with the patient regarding her presentation and plan of care.  In regards to the patient's chest pain, she has had an extensive work-up performed which is unremarkable from a cardiac standpoint from a cancer standpoint.  This is reassuring.  Patient's chest wall pain could be related to  fibromyalgia.  Patient does carry this diagnosis and has polyarthralgias as well.  We discussed dietary modification has the patient eats foods that are high in carbs, does intake quite a bit of dairy products.  We had a discussion about dietary modification including trying dairy free diet as well as removing gluten from her diet to see if this helps out with her symptoms.  Upon lab work that was completed 2 days ago at the patient's PCP, there is elevated ESR as well as low vitamin D levels.  I instructed the patient to supplement with vitamin D.  In regards to elevated ESR in the context of polyarthralgias, I will place referral for rheumatology.  In regards to her neck pain with radiation into bilateral upper extremities in the context of cervical spinal fusion, will obtain cervical MRI to evaluate for foraminal/canal stenosis and any disc pathology that could be contributing to her symptoms.  Medication management to continue with PCP.  I informed the patient that she would not be a candidate for chronic opioid therapy given her concomitant use of Klonopin and Ambien here and that we will focus primarily on interventional pain management.  Patient endorsed understanding.  Imaging Orders     MR CERVICAL SPINE WO CONTRAST  Referral Orders     Ambulatory referral to Rheumatology  Pharmacotherapy (current): Medications ordered:  Meds ordered  this encounter  Medications  . diclofenac sodium (VOLTAREN) 1 % GEL    Sig: Apply 4 g topically 4 (four) times daily.    Dispense:  100 g    Refill:  1   Medications administered during this visit: Cleola Perryman. Gu[place] had no medications administered during this visit.   Pharmacological management options: Per PCP  Interventional management options: Ms. Carby was informed that there is no guarantee that she would be a candidate for interventional therapies. The decision will be based on the results of diagnostic studies, as well as Ms. Sar's risk  profile.  Procedure(s) under consideration:  Cervical epidural for injection pending cervical MRI Diagnostic cervical facet medial branch nerve blocks   Provider-requested follow-up: Return for After Imaging.  Future Appointments  Date Time Provider [place]  04/12/2019  9:00 AM [facility]    Primary Care Physician: Lucille Passy, MD Location: [facility] Note by: Gillis Santa, MD Date: 03/19/2019; Time: 3:15 PM  Note: This dictation was prepared with Dragon dictation. Any transcriptional errors that may result from this process are unintentional.

## 2019-03-19 ENCOUNTER — Ambulatory Visit
Payer: BC Managed Care – PPO | Attending: Student in an Organized Health Care Education/Training Program | Admitting: Student in an Organized Health Care Education/Training Program

## 2019-03-19 ENCOUNTER — Encounter: Payer: Self-pay | Admitting: Student in an Organized Health Care Education/Training Program

## 2019-03-19 VITALS — BP 114/86 | HR 84 | Temp 98.3°F | Resp 18 | Ht 65.0 in | Wt 190.0 lb

## 2019-03-19 DIAGNOSIS — R0789 Other chest pain: Secondary | ICD-10-CM | POA: Insufficient documentation

## 2019-03-19 DIAGNOSIS — Z981 Arthrodesis status: Secondary | ICD-10-CM | POA: Diagnosis not present

## 2019-03-19 DIAGNOSIS — M797 Fibromyalgia: Secondary | ICD-10-CM

## 2019-03-19 DIAGNOSIS — R700 Elevated erythrocyte sedimentation rate: Secondary | ICD-10-CM | POA: Diagnosis not present

## 2019-03-19 DIAGNOSIS — G8929 Other chronic pain: Secondary | ICD-10-CM | POA: Diagnosis not present

## 2019-03-19 DIAGNOSIS — M2550 Pain in unspecified joint: Secondary | ICD-10-CM | POA: Insufficient documentation

## 2019-03-19 DIAGNOSIS — M5412 Radiculopathy, cervical region: Secondary | ICD-10-CM | POA: Diagnosis not present

## 2019-03-19 DIAGNOSIS — Z9889 Other specified postprocedural states: Secondary | ICD-10-CM | POA: Insufficient documentation

## 2019-03-19 LAB — CYCLIC CITRUL PEPTIDE ANTIBODY, IGG: Cyclic Citrullin Peptide Ab: <16 UNITS

## 2019-03-19 LAB — RHEUMATOID FACTOR: Rheumatoid fact SerPl-aCnc: <14 IU/mL (ref <14)

## 2019-03-19 MED ORDER — DICLOFENAC SODIUM 1 % TD GEL: 4.0000 g | Freq: Four times a day (QID) | TRANSDERMAL | 1 refills | Status: AC

## 2019-03-19 NOTE — Progress Notes (Signed)
Safety precautions to be maintained throughout the outpatient stay will include: orient to surroundings, keep bed in low position, maintain call bell within reach at all times, provide assistance with transfer out of bed and ambulation.  

## 2019-03-20 ENCOUNTER — Other Ambulatory Visit: Payer: Self-pay

## 2019-03-20 DIAGNOSIS — E559 Vitamin D deficiency, unspecified: Secondary | ICD-10-CM

## 2019-03-20 MED ORDER — VITAMIN D (ERGOCALCIFEROL) 1.25 MG (50000 UNIT) PO CAPS: 50000.0000 [IU] | ORAL_CAPSULE | ORAL | 0 refills | Status: DC

## 2019-03-26 DIAGNOSIS — F331 Major depressive disorder, recurrent, moderate: Secondary | ICD-10-CM | POA: Diagnosis not present

## 2019-03-28 ENCOUNTER — Telehealth: Payer: Self-pay | Admitting: *Deleted

## 2019-03-28 DIAGNOSIS — M5412 Radiculopathy, cervical region: Secondary | ICD-10-CM

## 2019-03-28 DIAGNOSIS — Z981 Arthrodesis status: Secondary | ICD-10-CM

## 2019-04-03 DIAGNOSIS — F331 Major depressive disorder, recurrent, moderate: Secondary | ICD-10-CM | POA: Diagnosis not present

## 2019-04-09 DIAGNOSIS — F331 Major depressive disorder, recurrent, moderate: Secondary | ICD-10-CM | POA: Diagnosis not present

## 2019-04-11 ENCOUNTER — Telehealth: Payer: Self-pay

## 2019-04-11 NOTE — Telephone Encounter (Signed)

## 2019-04-12 ENCOUNTER — Other Ambulatory Visit (INDEPENDENT_AMBULATORY_CARE_PROVIDER_SITE_OTHER): Payer: BC Managed Care – PPO

## 2019-04-12 ENCOUNTER — Other Ambulatory Visit: Payer: Self-pay

## 2019-04-12 DIAGNOSIS — E559 Vitamin D deficiency, unspecified: Secondary | ICD-10-CM | POA: Diagnosis not present

## 2019-04-12 LAB — VITAMIN D 25 HYDROXY (VIT D DEFICIENCY, FRACTURES): VITD: 30.48 ng/mL (ref 30.00–100.00)

## 2019-04-17 DIAGNOSIS — E538 Deficiency of other specified B group vitamins: Secondary | ICD-10-CM | POA: Diagnosis not present

## 2019-04-17 DIAGNOSIS — G894 Chronic pain syndrome: Secondary | ICD-10-CM | POA: Diagnosis not present

## 2019-04-17 DIAGNOSIS — R700 Elevated erythrocyte sedimentation rate: Secondary | ICD-10-CM | POA: Diagnosis not present

## 2019-04-17 DIAGNOSIS — D473 Essential (hemorrhagic) thrombocythemia: Secondary | ICD-10-CM | POA: Diagnosis not present

## 2019-04-17 DIAGNOSIS — M2550 Pain in unspecified joint: Secondary | ICD-10-CM | POA: Diagnosis not present

## 2019-04-17 DIAGNOSIS — D75839 Thrombocytosis, unspecified: Secondary | ICD-10-CM | POA: Insufficient documentation

## 2019-04-19 DIAGNOSIS — M67911 Unspecified disorder of synovium and tendon, right shoulder: Secondary | ICD-10-CM | POA: Diagnosis not present

## 2019-04-19 DIAGNOSIS — F331 Major depressive disorder, recurrent, moderate: Secondary | ICD-10-CM | POA: Diagnosis not present

## 2019-04-25 DIAGNOSIS — F331 Major depressive disorder, recurrent, moderate: Secondary | ICD-10-CM | POA: Diagnosis not present

## 2019-05-03 DIAGNOSIS — F331 Major depressive disorder, recurrent, moderate: Secondary | ICD-10-CM | POA: Diagnosis not present

## 2019-05-04 ENCOUNTER — Other Ambulatory Visit: Payer: Self-pay | Admitting: Student in an Organized Health Care Education/Training Program

## 2019-05-04 DIAGNOSIS — M2550 Pain in unspecified joint: Secondary | ICD-10-CM

## 2019-05-07 DIAGNOSIS — F331 Major depressive disorder, recurrent, moderate: Secondary | ICD-10-CM | POA: Diagnosis not present

## 2019-05-13 DIAGNOSIS — M75111 Incomplete rotator cuff tear or rupture of right shoulder, not specified as traumatic: Secondary | ICD-10-CM | POA: Diagnosis not present

## 2019-05-13 DIAGNOSIS — S43431A Superior glenoid labrum lesion of right shoulder, initial encounter: Secondary | ICD-10-CM | POA: Diagnosis not present

## 2019-05-13 DIAGNOSIS — M7551 Bursitis of right shoulder: Secondary | ICD-10-CM | POA: Diagnosis not present

## 2019-05-13 DIAGNOSIS — M7521 Bicipital tendinitis, right shoulder: Secondary | ICD-10-CM | POA: Diagnosis not present

## 2019-05-13 DIAGNOSIS — G8918 Other acute postprocedural pain: Secondary | ICD-10-CM | POA: Diagnosis not present

## 2019-05-13 DIAGNOSIS — M7541 Impingement syndrome of right shoulder: Secondary | ICD-10-CM | POA: Diagnosis not present

## 2019-05-17 ENCOUNTER — Other Ambulatory Visit: Payer: Self-pay | Admitting: Family Medicine

## 2019-05-28 ENCOUNTER — Telehealth: Payer: Self-pay

## 2019-05-28 NOTE — Telephone Encounter (Signed)

## 2019-05-29 ENCOUNTER — Other Ambulatory Visit: Payer: BC Managed Care – PPO

## 2019-06-07 DIAGNOSIS — F331 Major depressive disorder, recurrent, moderate: Secondary | ICD-10-CM | POA: Diagnosis not present

## 2019-06-19 DIAGNOSIS — F331 Major depressive disorder, recurrent, moderate: Secondary | ICD-10-CM | POA: Diagnosis not present

## 2019-06-27 DIAGNOSIS — F331 Major depressive disorder, recurrent, moderate: Secondary | ICD-10-CM | POA: Diagnosis not present

## 2019-07-08 DIAGNOSIS — F331 Major depressive disorder, recurrent, moderate: Secondary | ICD-10-CM | POA: Diagnosis not present

## 2019-07-23 ENCOUNTER — Other Ambulatory Visit: Payer: Self-pay

## 2019-07-23 DIAGNOSIS — F331 Major depressive disorder, recurrent, moderate: Secondary | ICD-10-CM | POA: Diagnosis not present

## 2019-07-23 MED ORDER — CYCLOBENZAPRINE HCL 10 MG PO TABS: ORAL_TABLET | ORAL | 4 refills | Status: DC

## 2019-07-23 NOTE — Telephone Encounter (Signed)
Last VV 03/12/19 Last fill 09/19/18  #30/4

## 2019-08-15 DIAGNOSIS — F331 Major depressive disorder, recurrent, moderate: Secondary | ICD-10-CM | POA: Diagnosis not present

## 2019-08-22 DIAGNOSIS — F331 Major depressive disorder, recurrent, moderate: Secondary | ICD-10-CM | POA: Diagnosis not present

## 2019-08-27 DIAGNOSIS — F331 Major depressive disorder, recurrent, moderate: Secondary | ICD-10-CM | POA: Diagnosis not present

## 2019-09-18 ENCOUNTER — Other Ambulatory Visit: Payer: Self-pay

## 2019-09-18 MED ORDER — SIMVASTATIN 10 MG PO TABS: 10.0000 mg | ORAL_TABLET | Freq: Every day | ORAL | 3 refills | Status: DC

## 2019-09-18 MED ORDER — LANSOPRAZOLE 30 MG PO CPDR: DELAYED_RELEASE_CAPSULE | ORAL | 3 refills | Status: AC

## 2019-09-23 ENCOUNTER — Encounter: Payer: Self-pay | Admitting: Family Medicine

## 2019-09-23 ENCOUNTER — Other Ambulatory Visit: Payer: Self-pay

## 2019-09-23 ENCOUNTER — Other Ambulatory Visit: Payer: Self-pay | Admitting: Family Medicine

## 2019-09-23 MED ORDER — ZOLPIDEM TARTRATE ER 12.5 MG PO TBCR: 12.5000 mg | EXTENDED_RELEASE_TABLET | Freq: Every day | ORAL | 5 refills | Status: AC

## 2019-09-23 MED ORDER — CLONAZEPAM 0.5 MG PO TABS: 0.5000 mg | ORAL_TABLET | Freq: Every evening | ORAL | 5 refills | Status: AC

## 2019-09-23 NOTE — Telephone Encounter (Signed)
TA-Plz see refill req for Clonazepam & Zopidem/per [place] PMP pt is compliant and without red flags/last filled 1.3.21 & 1.18.21/pended for your approval/thx dmf

## 2019-09-25 NOTE — Telephone Encounter (Signed)
TA-Plz see refill req for Percocet/per [place] PMP pt is compliant and without red flags/pended for your approval/thx dmf

## 2019-09-26 ENCOUNTER — Other Ambulatory Visit: Payer: Self-pay | Admitting: Family Medicine

## 2019-09-26 DIAGNOSIS — F331 Major depressive disorder, recurrent, moderate: Secondary | ICD-10-CM | POA: Diagnosis not present

## 2019-09-26 MED ORDER — OXYCODONE-ACETAMINOPHEN 5-325 MG PO TABS: 1.0000 | ORAL_TABLET | Freq: Every day | ORAL | 0 refills | Status: AC | PRN

## 2019-09-26 MED ORDER — OXYCODONE-ACETAMINOPHEN 5-325 MG PO TABS: 1.0000 | ORAL_TABLET | Freq: Every day | ORAL | 0 refills | Status: DC | PRN

## 2019-09-26 NOTE — Telephone Encounter (Signed)
TA-It won't let me refuse it/you have already sent this in/thx dmf

## 2019-10-07 DIAGNOSIS — G2581 Restless legs syndrome: Secondary | ICD-10-CM | POA: Diagnosis not present

## 2019-10-07 DIAGNOSIS — M797 Fibromyalgia: Secondary | ICD-10-CM | POA: Diagnosis not present

## 2019-10-07 DIAGNOSIS — G4700 Insomnia, unspecified: Secondary | ICD-10-CM | POA: Diagnosis not present

## 2019-10-07 DIAGNOSIS — F331 Major depressive disorder, recurrent, moderate: Secondary | ICD-10-CM | POA: Diagnosis not present

## 2019-10-07 DIAGNOSIS — F418 Other specified anxiety disorders: Secondary | ICD-10-CM | POA: Diagnosis not present

## 2019-10-07 DIAGNOSIS — M2550 Pain in unspecified joint: Secondary | ICD-10-CM | POA: Diagnosis not present

## 2019-10-07 DIAGNOSIS — Z79899 Other long term (current) drug therapy: Secondary | ICD-10-CM | POA: Diagnosis not present

## 2019-10-23 ENCOUNTER — Encounter: Payer: Self-pay | Admitting: Family Medicine

## 2019-10-23 ENCOUNTER — Ambulatory Visit: Payer: BC Managed Care – PPO | Admitting: Family Medicine

## 2019-10-23 ENCOUNTER — Other Ambulatory Visit: Payer: Self-pay

## 2019-10-23 VITALS — BP 104/78 | HR 92 | Temp 98.1°F | Ht 64.0 in | Wt 197.5 lb

## 2019-10-23 DIAGNOSIS — M797 Fibromyalgia: Secondary | ICD-10-CM | POA: Diagnosis not present

## 2019-10-23 DIAGNOSIS — Z79899 Other long term (current) drug therapy: Secondary | ICD-10-CM | POA: Insufficient documentation

## 2019-10-23 DIAGNOSIS — F1120 Opioid dependence, uncomplicated: Secondary | ICD-10-CM

## 2019-10-23 DIAGNOSIS — F331 Major depressive disorder, recurrent, moderate: Secondary | ICD-10-CM | POA: Diagnosis not present

## 2019-10-23 MED ORDER — CYCLOBENZAPRINE HCL 10 MG PO TABS: ORAL_TABLET | ORAL | 4 refills | Status: AC

## 2019-10-23 NOTE — Patient Instructions (Signed)
If you cannot get your Clonazepam refilled call back.

## 2019-10-23 NOTE — Assessment & Plan Note (Signed)
Pt using oxycodone prn for pain, flexeril daily. Cannot tolerate NSAIDs. Encouraged integrative approaches for pain control.

## 2019-10-23 NOTE — Assessment & Plan Note (Signed)
Reports using daily for restless legs syndrome. Unclear complete history. Given ambien, oxycodone, and clonazepam use discussed that if she were to continue care with me she would need to stop some of her sedating medications or significantly decrease use. She decided she would pursue another primary care. Previous PCP did send clonazepam with 5 refills, but as she was with a provider from our practice if she has issues getting the refill I agreed to provide a 1 month bridge of Clonazepam.

## 2019-10-23 NOTE — Progress Notes (Signed)
   Subjective:     [name] is a 49 y.o. female presenting for Medication Refill     HPI   #Fibromylagia - never ending battle of what will hurt first - use to go to integrative therapies - has not been back in a couple of years - insurance covered for a certain number of session - uses flexeril for tension headaches  Avoids NSAIDs - due to heartburn  Review of Systems   Social History   Tobacco Use  Smoking Status Never Smoker  Smokeless Tobacco Never Used        Objective:    BP Readings from Last 3 Encounters:  10/23/19 104/78  03/19/19 114/86  07/27/18 104/84   Wt Readings from Last 3 Encounters:  10/23/19 197 lb 8 oz (89.6 kg)  03/19/19 190 lb (86.2 kg)  03/12/19 188 lb (85.3 kg)    BP 104/78   Pulse 92   Temp 98.1 F (36.7 C)   Ht 5\' 4"  (1.626 m)   Wt 197 lb 8 oz (89.6 kg)   SpO2 97%   BMI 33.90 kg/m    Physical Exam Constitutional:      General: She is not in acute distress.    Appearance: She is well-developed. She is not diaphoretic.  HENT:     Right Ear: External ear normal.     Left Ear: External ear normal.     Nose: Nose normal.  Eyes:     Conjunctiva/sclera: Conjunctivae normal.  Cardiovascular:     Rate and Rhythm: Normal rate.  Pulmonary:     Effort: Pulmonary effort is normal.  Musculoskeletal:     Cervical back: Neck supple.  Skin:    General: Skin is warm and dry.     Capillary Refill: Capillary refill takes less than 2 seconds.  Neurological:     Mental Status: She is alert. Mental status is at baseline.  Psychiatric:        Mood and Affect: Mood normal.        Behavior: Behavior normal.           Assessment & Plan:   Problem List Items Addressed This Visit      Other   Fibromyalgia - Primary    Pt using oxycodone prn for pain, flexeril daily. Cannot tolerate NSAIDs. Encouraged integrative approaches for pain control.       Relevant Medications   cyclobenzaprine (FLEXERIL) 10 MG tablet   Opioid  type dependence, continuous (HCC)   Chronic prescription benzodiazepine use    Reports using daily for restless legs syndrome. Unclear complete history. Given ambien, oxycodone, and clonazepam use discussed that if she were to continue care with me she would need to stop some of her sedating medications or significantly decrease use. She decided she would pursue another primary care. Previous PCP did send clonazepam with 5 refills, but as she was with a provider from our practice if she has issues getting the refill I agreed to provide a 1 month bridge of Clonazepam.           Return if symptoms worsen or fail to improve.  , MD

## 2019-11-01 DIAGNOSIS — G2581 Restless legs syndrome: Secondary | ICD-10-CM | POA: Insufficient documentation

## 2019-11-05 DIAGNOSIS — M25441 Effusion, right hand: Secondary | ICD-10-CM | POA: Diagnosis not present

## 2019-11-05 DIAGNOSIS — Z8781 Personal history of (healed) traumatic fracture: Secondary | ICD-10-CM | POA: Diagnosis not present

## 2019-11-18 DIAGNOSIS — G2581 Restless legs syndrome: Secondary | ICD-10-CM | POA: Diagnosis not present

## 2019-11-21 DIAGNOSIS — F331 Major depressive disorder, recurrent, moderate: Secondary | ICD-10-CM | POA: Diagnosis not present

## 2019-11-21 DIAGNOSIS — E538 Deficiency of other specified B group vitamins: Secondary | ICD-10-CM | POA: Diagnosis not present

## 2019-11-21 DIAGNOSIS — F418 Other specified anxiety disorders: Secondary | ICD-10-CM | POA: Diagnosis not present

## 2019-11-21 DIAGNOSIS — G2581 Restless legs syndrome: Secondary | ICD-10-CM | POA: Diagnosis not present

## 2019-11-21 DIAGNOSIS — G4700 Insomnia, unspecified: Secondary | ICD-10-CM | POA: Diagnosis not present

## 2019-11-21 DIAGNOSIS — R5383 Other fatigue: Secondary | ICD-10-CM | POA: Diagnosis not present

## 2019-11-21 DIAGNOSIS — E559 Vitamin D deficiency, unspecified: Secondary | ICD-10-CM | POA: Diagnosis not present

## 2019-12-02 DIAGNOSIS — G2581 Restless legs syndrome: Secondary | ICD-10-CM | POA: Diagnosis not present

## 2019-12-02 DIAGNOSIS — E538 Deficiency of other specified B group vitamins: Secondary | ICD-10-CM | POA: Insufficient documentation

## 2019-12-02 DIAGNOSIS — E559 Vitamin D deficiency, unspecified: Secondary | ICD-10-CM | POA: Diagnosis not present

## 2019-12-02 DIAGNOSIS — R5383 Other fatigue: Secondary | ICD-10-CM | POA: Diagnosis not present

## 2019-12-09 ENCOUNTER — Encounter: Payer: BC Managed Care – PPO | Admitting: Family Medicine

## 2019-12-16 DIAGNOSIS — R5383 Other fatigue: Secondary | ICD-10-CM | POA: Diagnosis not present

## 2019-12-16 DIAGNOSIS — G4700 Insomnia, unspecified: Secondary | ICD-10-CM | POA: Diagnosis not present

## 2019-12-16 DIAGNOSIS — F418 Other specified anxiety disorders: Secondary | ICD-10-CM | POA: Diagnosis not present

## 2019-12-16 DIAGNOSIS — E538 Deficiency of other specified B group vitamins: Secondary | ICD-10-CM | POA: Diagnosis not present

## 2020-01-08 DIAGNOSIS — F331 Major depressive disorder, recurrent, moderate: Secondary | ICD-10-CM | POA: Diagnosis not present

## 2020-01-09 DIAGNOSIS — M5412 Radiculopathy, cervical region: Secondary | ICD-10-CM | POA: Diagnosis not present

## 2020-01-09 DIAGNOSIS — M2550 Pain in unspecified joint: Secondary | ICD-10-CM | POA: Diagnosis not present

## 2020-01-09 DIAGNOSIS — G4700 Insomnia, unspecified: Secondary | ICD-10-CM | POA: Diagnosis not present

## 2020-01-09 DIAGNOSIS — M797 Fibromyalgia: Secondary | ICD-10-CM | POA: Diagnosis not present

## 2020-01-14 DIAGNOSIS — Z6832 Body mass index (BMI) 32.0-32.9, adult: Secondary | ICD-10-CM | POA: Diagnosis not present

## 2020-01-14 DIAGNOSIS — Z01419 Encounter for gynecological examination (general) (routine) without abnormal findings: Secondary | ICD-10-CM | POA: Diagnosis not present

## 2020-01-14 DIAGNOSIS — Z1231 Encounter for screening mammogram for malignant neoplasm of breast: Secondary | ICD-10-CM | POA: Diagnosis not present

## 2020-01-14 DIAGNOSIS — Z130 Encounter for screening for diseases of the blood and blood-forming organs and certain disorders involving the immune mechanism: Secondary | ICD-10-CM | POA: Diagnosis not present

## 2020-01-14 DIAGNOSIS — Z1389 Encounter for screening for other disorder: Secondary | ICD-10-CM | POA: Diagnosis not present

## 2020-01-22 DIAGNOSIS — F331 Major depressive disorder, recurrent, moderate: Secondary | ICD-10-CM | POA: Diagnosis not present

## 2020-03-23 ENCOUNTER — Other Ambulatory Visit: Payer: Self-pay | Admitting: *Deleted

## 2020-03-23 NOTE — Telephone Encounter (Signed)
Received refill request for clonazepam. Looks like patient has changed providers to [facility] with [facility].  Will fax refill request to her office.  Dr. Selena Batten removed as PCP.

## 2020-05-25 ENCOUNTER — Other Ambulatory Visit: Payer: Self-pay | Admitting: Family Medicine

## 2020-06-30 ENCOUNTER — Other Ambulatory Visit: Payer: Self-pay | Admitting: Neurology

## 2020-06-30 DIAGNOSIS — G43019 Migraine without aura, intractable, without status migrainosus: Secondary | ICD-10-CM

## 2020-07-12 ENCOUNTER — Ambulatory Visit
Admission: RE | Admit: 2020-07-12 | Discharge: 2020-07-12 | Disposition: A | Discharge status: Home / Self Care | Payer: BC Managed Care – PPO | Source: Ambulatory Visit | Attending: Neurology | Admitting: Neurology

## 2020-07-12 ENCOUNTER — Other Ambulatory Visit: Payer: Self-pay

## 2020-07-12 DIAGNOSIS — G43019 Migraine without aura, intractable, without status migrainosus: Secondary | ICD-10-CM | POA: Insufficient documentation

## 2020-07-12 MED ORDER — GADOBUTROL 1 MMOL/ML IV SOLN
9.0000 mL | Freq: Once | INTRAVENOUS | Status: AC | PRN
  Administered 2020-07-12: 9 mL via INTRAVENOUS

## 2020-08-21 ENCOUNTER — Other Ambulatory Visit: Payer: Self-pay | Admitting: Family Medicine

## 2021-04-08 LAB — HM MAMMOGRAPHY

## 2021-06-24 DIAGNOSIS — Z6833 Body mass index (BMI) 33.0-33.9, adult: Secondary | ICD-10-CM | POA: Insufficient documentation

## 2021-06-24 DIAGNOSIS — G43809 Other migraine, not intractable, without status migrainosus: Secondary | ICD-10-CM | POA: Insufficient documentation

## 2021-06-30 ENCOUNTER — Encounter: Payer: Self-pay | Admitting: Physician Assistant

## 2021-09-01 ENCOUNTER — Encounter: Payer: Self-pay | Admitting: Internal Medicine

## 2021-09-16 ENCOUNTER — Ambulatory Visit (AMBULATORY_SURGERY_CENTER): Payer: BC Managed Care – PPO | Admitting: *Deleted

## 2021-09-16 ENCOUNTER — Other Ambulatory Visit: Payer: Self-pay

## 2021-09-16 VITALS — Ht 65.0 in | Wt 192.0 lb

## 2021-09-16 DIAGNOSIS — Z1211 Encounter for screening for malignant neoplasm of colon: Secondary | ICD-10-CM

## 2021-09-16 MED ORDER — NA SULFATE-K SULFATE-MG SULF 17.5-3.13-1.6 GM/177ML PO SOLN: 1.0000 | Freq: Once | ORAL | 0 refills | Status: AC

## 2021-09-16 NOTE — Progress Notes (Signed)
No egg or soy allergy known to patient  °No issues known to pt with past sedation with any surgeries or procedures °Patient denies ever being told they had issues or difficulty with intubation  °No FH of Malignant Hyperthermia °Pt is not on diet pills °Pt is not on  home 02  °Pt is not on blood thinners  °Pt denies issues with constipation  °No A fib or A flutter ° °Pt is fully vaccinated  for Covid  ° ° NO PA's for preps discussed with pt In PV today  °Discussed with pt there will be an out-of-pocket cost for prep and that varies from $0 to 70 +  dollars - pt verbalized understanding  ° °Due to the COVID-19 pandemic we are asking patients to follow certain guidelines in PV and the [facility]   °Pt aware of COVID protocols and [facility] guidelines  ° °PV completed over the phone. Pt verified name, DOB, address and insurance during PV today.  °Pt mailed instruction packet with copy of consent form to read and not return, and instructions.  °Pt encouraged to call with questions or issues.  °If pt has My chart, procedure instructions sent via My Chart  ° °

## 2021-09-28 ENCOUNTER — Encounter: Payer: Self-pay | Admitting: Internal Medicine

## 2021-09-29 ENCOUNTER — Encounter: Payer: Self-pay | Admitting: Internal Medicine

## 2021-09-29 ENCOUNTER — Ambulatory Visit (AMBULATORY_SURGERY_CENTER): Payer: BC Managed Care – PPO | Admitting: Internal Medicine

## 2021-09-29 VITALS — BP 111/56 | HR 83 | Temp 98.0°F | Resp 11 | Ht 64.0 in | Wt 192.0 lb

## 2021-09-29 DIAGNOSIS — Z1211 Encounter for screening for malignant neoplasm of colon: Secondary | ICD-10-CM | POA: Diagnosis present

## 2021-09-29 MED ORDER — SODIUM CHLORIDE 0.9 % IV SOLN: 500.0000 mL | Freq: Once | INTRAVENOUS | Status: DC

## 2021-09-29 NOTE — Progress Notes (Signed)
VS  DT ? ?Pt's states no medical or surgical changes since previsit or office visit. ? ?

## 2021-09-29 NOTE — Progress Notes (Signed)
1338 Patient experiencing nausea and vomiting.  MD updated and Zofran 4 mg IV given, vss

## 2021-09-29 NOTE — Patient Instructions (Signed)
Handouts on diverticulosis and hemorrhoids given to you today    YOU HAD AN ENDOSCOPIC PROCEDURE TODAY AT THE [facility]:   Refer to the procedure report that was given to you for any specific questions about what was found during the examination.  If the procedure report does not answer your questions, please call your gastroenterologist to clarify.  If you requested that your care partner not be given the details of your procedure findings, then the procedure report has been included in a sealed envelope for you to review at your convenience later.  YOU SHOULD EXPECT: Some feelings of bloating in the abdomen. Passage of more gas than usual.  Walking can help get rid of the air that was put into your GI tract during the procedure and reduce the bloating. If you had a lower endoscopy (such as a colonoscopy or flexible sigmoidoscopy) you may notice spotting of blood in your stool or on the toilet paper. If you underwent a bowel prep for your procedure, you may not have a normal bowel movement for a few days.  Please Note:  You might notice some irritation and congestion in your nose or some drainage.  This is from the oxygen used during your procedure.  There is no need for concern and it should clear up in a day or so.  SYMPTOMS TO REPORT IMMEDIATELY:   Following lower endoscopy (colonoscopy or flexible sigmoidoscopy):  Excessive amounts of blood in the stool  Significant tenderness or worsening of abdominal pains  Swelling of the abdomen that is new, acute  Fever of 100F or higher  For urgent or emergent issues, a gastroenterologist can be reached at any hour by calling [phone]. Do not use MyChart messaging for urgent concerns.    DIET:  We do recommend a small meal at first, but then you may proceed to your regular diet.  Drink plenty of fluids but you should avoid alcoholic beverages for 24 hours.  ACTIVITY:  You should plan to take it easy for the rest of today and  you should NOT DRIVE or use heavy machinery until tomorrow (because of the sedation medicines used during the test).    FOLLOW UP: Our staff will call the number listed on your records 48-72 hours following your procedure to check on you and address any questions or concerns that you may have regarding the information given to you following your procedure. If we do not reach you, we will leave a message.  We will attempt to reach you two times.  During this call, we will ask if you have developed any symptoms of COVID 19. If you develop any symptoms (ie: fever, flu-like symptoms, shortness of breath, cough etc.) before then, please call [phone].  If you test positive for Covid 19 in the 2 weeks post procedure, please call and report this information to us.    If any biopsies were taken you will be contacted by phone or by letter within the next 1-3 weeks.  Please call us at [phone] if you have not heard about the biopsies in 3 weeks.    SIGNATURES/CONFIDENTIALITY: You and/or your care partner have signed paperwork which will be entered into your electronic medical record.  These signatures attest to the fact that that the information above on your After Visit Summary has been reviewed and is understood.  Full responsibility of the confidentiality of this discharge information lies with you and/or your care-partner. 

## 2021-09-29 NOTE — Progress Notes (Signed)
GASTROENTEROLOGY PROCEDURE H&P NOTE   Primary Care Physician: Porfirio Oar, PA    Reason for Procedure:   Colon  cancer screening  Plan:    colonoscopy  Patient is appropriate for endoscopic procedure(s) in the ambulatory ([facility]) setting.  The nature of the procedure, as well as the risks, benefits, and alternatives were carefully and thoroughly reviewed with the patient. Ample time for discussion and questions allowed. The patient understood, was satisfied, and agreed to proceed.     HPI: [name] is a 51 y.o. female who presents for colonoscopy for colon cancer screening. Denies blood in stools, changes in bowel habits, weight loss. Uncle had colon cancer.  Past Medical History:  Diagnosis Date   Anemia    with pregnancy   Bulging disc    T7/8-receiving ESI's (Dr. Charlett Blake)   Bulimia nervosa    history   Fibromyalgia    Dr. Corliss Skains   Genital herpes    GERD (gastroesophageal reflux disease)    Hyperlipidemia    PONV (postoperative nausea and vomiting)    and has the shakes after sedation as well    Past Surgical History:  Procedure Laterality Date   ABDOMINAL HYSTERECTOMY     endometriosis   CESAREAN SECTION     x1   NECK SURGERY     PAROTIDECTOMY  08/16/2007   benign tumor   ROTATOR CUFF REPAIR     wsidom tooth     x 1- age 38    Prior to Admission medications   Medication Sig Start Date End Date Taking? Authorizing Provider  clonazePAM (KLONOPIN) 0.5 MG tablet Take 1 tablet (0.5 mg total) by mouth every evening. 09/23/19  Yes Dianne Dun, MD  cyclobenzaprine (FLEXERIL) 10 MG tablet TAKE 1 TABLET BY MOUTH AT BEDTIME AS NEEDED MUSCLE SPASMS 10/23/19  Yes Lynnda Child, MD  DULoxetine (CYMBALTA) 30 MG capsule Take 1 capsule (30 mg total) by mouth daily. 02/26/19  Yes Dianne Dun, MD  EMGALITY 120 MG/ML SOAJ SMARTSIG:1 Milliliter(s) SUB-Q Every 4 Weeks 08/20/21  Yes [provider]  lansoprazole (PREVACID) 30 MG capsule TAKE 1 CAPSULE BY  MOUTH EVERY DAY 09/18/19  Yes Dianne Dun, MD  simvastatin (ZOCOR) 20 MG tablet SMARTSIG:1 Tablet(s) By Mouth Every Evening 08/22/21  Yes [provider]  zolpidem (AMBIEN CR) 12.5 MG CR tablet Take 1 tablet (12.5 mg total) by mouth at bedtime. 09/23/19  Yes Dianne Dun, MD  meclizine (ANTIVERT) 25 MG tablet meclizine 25 mg tablet    [provider]  ondansetron (ZOFRAN-ODT) 8 MG disintegrating tablet Take by mouth. 06/02/20   [provider]  oxyCODONE-acetaminophen (PERCOCET) 5-325 MG tablet Take 1 tablet by mouth daily as needed for severe pain. 09/26/19   Dianne Dun, MD  valACYclovir (VALTREX) 500 MG tablet Take 1 tablet (500 mg total) by mouth 2 (two) times daily as needed. 11/02/17   Dianne Dun, MD    Current Outpatient Medications  Medication Sig Dispense Refill   clonazePAM (KLONOPIN) 0.5 MG tablet Take 1 tablet (0.5 mg total) by mouth every evening. 30 tablet 5   cyclobenzaprine (FLEXERIL) 10 MG tablet TAKE 1 TABLET BY MOUTH AT BEDTIME AS NEEDED MUSCLE SPASMS 30 tablet 4   DULoxetine (CYMBALTA) 30 MG capsule Take 1 capsule (30 mg total) by mouth daily. 90 capsule 3   EMGALITY 120 MG/ML SOAJ SMARTSIG:1 Milliliter(s) SUB-Q Every 4 Weeks     lansoprazole (PREVACID) 30 MG capsule TAKE 1 CAPSULE  BY MOUTH EVERY DAY 90 capsule 3   simvastatin (ZOCOR) 20 MG tablet SMARTSIG:1 Tablet(s) By Mouth Every Evening     zolpidem (AMBIEN CR) 12.5 MG CR tablet Take 1 tablet (12.5 mg total) by mouth at bedtime. 30 tablet 5   meclizine (ANTIVERT) 25 MG tablet meclizine 25 mg tablet     ondansetron (ZOFRAN-ODT) 8 MG disintegrating tablet Take by mouth.     oxyCODONE-acetaminophen (PERCOCET) 5-325 MG tablet Take 1 tablet by mouth daily as needed for severe pain. 30 tablet 0   valACYclovir (VALTREX) 500 MG tablet Take 1 tablet (500 mg total) by mouth 2 (two) times daily as needed. 180 tablet 1   Current Facility-Administered Medications  Medication Dose Route Frequency Provider  Last Rate Last Admin   0.9 %  sodium chloride infusion  500 mL Intravenous Once Imogene Burn, MD        Allergies as of 09/29/2021 - Review Complete 09/29/2021  Allergen Reaction Noted   Codeine  01/22/2010   Doxycycline Nausea And Vomiting 01/28/2013   Neosporin [neomycin-bacitracin zn-polymyx]  07/27/2015    Family History  Problem Relation Age of Onset   Lung cancer Mother    Stroke Father    Melanoma Sister    Breast cancer Maternal Aunt 54   Lung cancer Maternal Uncle    Stomach cancer Paternal Uncle    Prostate cancer Paternal Uncle    Colon cancer Neg Hx    Colon polyps Neg Hx    Esophageal cancer Neg Hx    Rectal cancer Neg Hx     Social History   Socioeconomic History   Marital status: Divorced    Spouse name: Not on file   Number of children: 1   Years of education: Not on file   Highest education level: Not on file  Occupational History   Occupation: Building surveyor  Tobacco Use   Smoking status: Never   Smokeless tobacco: Never  Substance and Sexual Activity   Alcohol use: No    Comment: occasional   Drug use: No   Sexual activity: Not on file  Other Topics Concern   Not on file  Social History Narrative   Regular exercise-yes      Plays softball regularly      Has 1 daughter       Lives in [place]      IT Emergency planning/management officer for [employer]            Social Determinants of Health   Financial Resource Strain: Not on file  Food Insecurity: Not on file  Transportation Needs: Not on file  Physical Activity: Not on file  Stress: Not on file  Social Connections: Not on file  Intimate Partner Violence: Not on file    Physical Exam: Vital signs in last 24 hours: BP 108/68    Pulse 98    Temp 98 F (36.7 C)    Ht 5\' 4"  (1.626 m)    Wt 192 lb (87.1 kg)    SpO2 95%    BMI 32.96 kg/m  GEN: NAD EYE: Sclerae anicteric ENT: MMM CV: Non-tachycardic Pulm: No increased work of breathing GI: Soft, NT/ND NEURO:  Alert &  Oriented   , MD [facility]  09/29/2021 12:54 PM

## 2021-09-29 NOTE — Op Note (Signed)
[facility] Patient Name: [name] Procedure Date: 09/29/2021 1:13 PM MRN: [id number] Endoscopist: Nicole Kindred "[name] ,  Age: 51 Referring MD:  Date of Birth: 08-28-1970 Gender: Female Account #: [id number] Procedure:                Colonoscopy Indications:              Screening for colorectal malignant neoplasm Medicines:                Monitored Anesthesia Care Procedure:                Pre-Anesthesia Assessment:                           - Prior to the procedure, a History and Physical                            was performed, and patient medications and                            allergies were reviewed. The patient's tolerance of                            previous anesthesia was also reviewed. The risks                            and benefits of the procedure and the sedation                            options and risks were discussed with the patient.                            All questions were answered, and informed consent                            was obtained. Prior Anticoagulants: The patient has                            taken no previous anticoagulant or antiplatelet                            agents except for aspirin. ASA Grade Assessment: II                            - A patient with mild systemic disease. After                            reviewing the risks and benefits, the patient was                            deemed in satisfactory condition to undergo the                            procedure.  After obtaining informed consent, the colonoscope                            was passed under direct vision. Throughout the                            procedure, the patient's blood pressure, pulse, and                            oxygen saturations were monitored continuously. The                            Olympus CF-HQ190L ([id number]) Colonoscope was                            introduced through the anus and advanced to the  the                            terminal ileum. The colonoscopy was performed                            without difficulty. The patient tolerated the                            procedure well. The quality of the bowel                            preparation was good. The terminal ileum, ileocecal                            valve, appendiceal orifice, and rectum were                            photographed. Scope In: 1:38:54 PM Scope Out: 1:51:54 PM Scope Withdrawal Time: 0 hours 10 minutes 24 seconds  Total Procedure Duration: 0 hours 13 minutes 0 seconds  Findings:                 The terminal ileum appeared normal.                           A single localized angioectasia without bleeding                            was found in the cecum.                           Multiple diverticula were found in the sigmoid                            colon.                           Non-bleeding internal hemorrhoids were found during  retroflexion. Complications:            No immediate complications. Estimated Blood Loss:     Estimated blood loss: none. Impression:               - The examined portion of the ileum was normal.                           - A single non-bleeding colonic angioectasia.                           - Diverticulosis in the sigmoid colon.                           - Non-bleeding internal hemorrhoids.                           - No specimens collected. Recommendation:           - Discharge patient to home (with escort).                           - Repeat colonoscopy in 10 years for screening                            purposes.                           - The findings and recommendations were discussed                            with the patient. Nicole Kindred "[name],  09/29/2021 2:00:05 PM

## 2021-10-01 ENCOUNTER — Telehealth: Payer: Self-pay | Admitting: *Deleted

## 2021-10-01 NOTE — Telephone Encounter (Signed)
°  Follow up Call-  Call back number 09/29/2021  Post procedure Call Back phone  # [phone]  Permission to leave phone message Yes  Some recent data might be hidden     Patient questions:  Do you have a fever, pain , or abdominal swelling? No. Pain Score  0 *  Have you tolerated food without any problems? Yes.    Have you been able to return to your normal activities? Yes.    Do you have any questions about your discharge instructions: Diet   No. Medications  No. Follow up visit  No.  Do you have questions or concerns about your Care? No.  Actions: * If pain score is 4 or above: No action needed, pain <4.

## 2021-10-11 ENCOUNTER — Encounter: Payer: Self-pay | Admitting: Dermatology

## 2021-10-11 ENCOUNTER — Ambulatory Visit: Payer: BC Managed Care – PPO | Admitting: Dermatology

## 2021-10-11 ENCOUNTER — Other Ambulatory Visit: Payer: Self-pay

## 2021-10-11 DIAGNOSIS — S90211A Contusion of right great toe with damage to nail, initial encounter: Secondary | ICD-10-CM

## 2021-10-11 NOTE — Patient Instructions (Signed)

## 2021-10-11 NOTE — Progress Notes (Signed)
° °  New Patient Visit  Subjective  [name] is a 51 y.o. female who presents for the following: Skin Problem (C/O lesion under right great toenail. Dur: noticed since 06/2021. Denies pain, denies bleeding. Denies prior injury to toenail. Getting larger). The patient has spots, moles and lesions to be evaluated, some may be new or changing and the patient has concerns that these could be cancer.  Review of Systems: No other skin or systemic complaints except as noted in HPI or Assessment and Plan.  Objective  Well appearing patient in no apparent distress; mood and affect are within normal limits.  A focused examination was performed including face, right foot/toenails. Relevant physical exam findings are noted in the Assessment and Plan.  Right Hallux Toe Nail Plate 0.8 cm brown coloration 0.2cm distal to proximal nail fold. No Hutchinson sign on clinical exam.        Assessment & Plan  Subungual hematoma of great toe of right foot, initial encounter See exam. No Hutchinson's sign. Not consistent with melanocytic lesion at this time. Right Hallux Toe Nail Plate Benign-appearing.  Observation.  Call clinic for new or changing lesions.  Recommend daily use of broad spectrum spf 30+ sunscreen to sun-exposed areas.   Advised patient lesion should continue to grow out with nail growth. Great toenails are slower to clear than other nails on the feet. Recommend observation at this time. Discussed nail biopsy, nail may not regrow normally or at all after a biopsy.  Return in about 3 months (around 01/08/2022) for Toenail Recheck.  I, Lawson Radar, CMA, am acting as scribe for Armida Sans, MD. Documentation: I have reviewed the above documentation for accuracy and completeness, and I agree with the above.  Armida Sans, MD

## 2021-10-12 ENCOUNTER — Encounter: Payer: Self-pay | Admitting: Dermatology

## 2021-10-31 ENCOUNTER — Encounter: Payer: Self-pay | Admitting: Emergency Medicine

## 2021-10-31 ENCOUNTER — Ambulatory Visit
Admission: EM | Admit: 2021-10-31 | Discharge: 2021-10-31 | Disposition: A | Discharge status: Home / Self Care | Payer: BC Managed Care – PPO | Attending: Family Medicine | Admitting: Family Medicine

## 2021-10-31 ENCOUNTER — Other Ambulatory Visit: Payer: Self-pay

## 2021-10-31 DIAGNOSIS — A084 Viral intestinal infection, unspecified: Secondary | ICD-10-CM | POA: Diagnosis not present

## 2021-10-31 MED ORDER — DIPHENOXYLATE-ATROPINE 2.5-0.025 MG PO TABS
1.0000 | ORAL_TABLET | Freq: Four times a day (QID) | ORAL | 0 refills | Status: DC | PRN
Start: 2021-10-31 — End: 2022-01-19

## 2021-10-31 MED ORDER — ONDANSETRON HCL 4 MG PO TABS: 4.0000 mg | ORAL_TABLET | Freq: Four times a day (QID) | ORAL | 0 refills | Status: AC

## 2021-10-31 NOTE — ED Provider Notes (Signed)
?[facility] ? ? ? ?CSN: [id number] ?Arrival date & time: 10/31/21  1357 ? ? ?  ? ?History   ?Chief Complaint ?Chief Complaint  ?Patient presents with  ? Diarrhea  ? ? ?HPI ?[name] is a 51 y.o. female.  ? ?HPI ?Patient presents today with a 4-day history of recurrent diarrhea, fatigue and poor appetite.  Patient also reports 2 days of low-grade fever.  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  She is uncertain if she has been exposed to any particular virus.  She denies any upper respiratory symptoms.  She has not taken any medication for symptoms.  Denies any severe abdominal pain, shortness of breath, or chest pain. ? ?Past Medical History:  ?Diagnosis Date  ? Anemia   ? with pregnancy  ? Bulging disc   ? T7/8-receiving ESI's (Dr. Lynann Bologna)  ? Bulimia nervosa   ? history  ? Fibromyalgia   ? Dr. Estanislado Pandy  ? Genital herpes   ? GERD (gastroesophageal reflux disease)   ? Hyperlipidemia   ? PONV (postoperative nausea and vomiting)   ? and has the shakes after sedation as well  ? ? ?Patient Active Problem List  ? Diagnosis Date Noted  ? Chronic prescription benzodiazepine use 10/23/2019  ? Chronic chest wall pain 03/19/2019  ? ESR raised 03/19/2019  ? Polyarthralgia 03/19/2019  ? S/P cervical spinal fusion 03/19/2019  ? Depression with anxiety 04/10/2018  ? Fatigue 04/10/2018  ? Opioid type dependence, continuous ([place]) 11/30/2017  ? Arthritis 07/15/2015  ? Hot flashes 07/15/2015  ? Shortness of breath 08/20/2013  ? Cervical radicular pain 11/24/2011  ? Fibromyalgia 04/07/2011  ? HLD (hyperlipidemia) 05/05/2010  ? GENITAL HERPES 01/22/2010  ? INSOMNIA, CHRONIC 01/22/2010  ? GERD 01/22/2010  ? BULIMIA, HX OF 01/22/2010  ? ? ?Past Surgical History:  ?Procedure Laterality Date  ? ABDOMINAL HYSTERECTOMY    ? endometriosis  ? CESAREAN SECTION    ? x1  ? NECK SURGERY    ? PAROTIDECTOMY  08/16/2007  ? benign tumor  ? ROTATOR CUFF REPAIR    ? wsidom tooth    ? x 1- age 11  ? ? ?OB History   ?No  obstetric history on file. ?  ? ? ? ?Home Medications   ? ?Prior to Admission medications   ?Medication Sig Start Date End Date Taking? Authorizing Provider  ?diphenoxylate-atropine (LOMOTIL) 2.5-0.025 MG tablet Take 1-2 tablets by mouth 4 (four) times daily as needed for diarrhea or loose stools. 10/31/21  Yes Scot Jun, FNP  ?ondansetron (ZOFRAN) 4 MG tablet Take 1 tablet (4 mg total) by mouth every 6 (six) hours. 10/31/21  Yes Scot Jun, FNP  ?clonazePAM (KLONOPIN) 0.5 MG tablet Take 1 tablet (0.5 mg total) by mouth every evening. 09/23/19   Lucille Passy, MD  ?cyclobenzaprine (FLEXERIL) 10 MG tablet TAKE 1 TABLET BY MOUTH AT BEDTIME AS NEEDED MUSCLE SPASMS 10/23/19   Lesleigh Noe, MD  ?DULoxetine (CYMBALTA) 30 MG capsule Take 1 capsule (30 mg total) by mouth daily. 02/26/19   Lucille Passy, MD  ?Verdell Face 120 MG/ML SOAJ SMARTSIG:1 Milliliter(s) SUB-Q Every 4 Weeks 08/20/21   [provider]  ?lansoprazole (PREVACID) 30 MG capsule TAKE 1 CAPSULE BY MOUTH EVERY DAY 09/18/19   Lucille Passy, MD  ?meclizine (ANTIVERT) 25 MG tablet meclizine 25 mg tablet    [provider]  ?ondansetron (ZOFRAN-ODT) 8 MG disintegrating tablet Take by mouth. 06/02/20  [provider]  ?oxyCODONE-acetaminophen (PERCOCET) 5-325 MG tablet Take 1 tablet by mouth daily as needed for severe pain. 09/26/19   Lucille Passy, MD  ?simvastatin (ZOCOR) 20 MG tablet SMARTSIG:1 Tablet(s) By Mouth Every Evening 08/22/21   [provider]  ?valACYclovir (VALTREX) 500 MG tablet Take 1 tablet (500 mg total) by mouth 2 (two) times daily as needed. 11/02/17   Lucille Passy, MD  ?zolpidem (AMBIEN CR) 12.5 MG CR tablet Take 1 tablet (12.5 mg total) by mouth at bedtime. 09/23/19   Lucille Passy, MD  ? ? ?Family History ?Family History  ?Problem Relation Age of Onset  ? Lung cancer Mother   ? Stroke Father   ? Melanoma Sister   ? Breast cancer Maternal Aunt 45  ? Lung cancer Maternal Uncle   ? Stomach cancer Paternal  Uncle   ? Prostate cancer Paternal Uncle   ? Colon cancer Neg Hx   ? Colon polyps Neg Hx   ? Esophageal cancer Neg Hx   ? Rectal cancer Neg Hx   ? ? ?Social History ?Social History  ? ?Tobacco Use  ? Smoking status: Never  ? Smokeless tobacco: Never  ?Vaping Use  ? Vaping Use: Never used  ?Substance Use Topics  ? Alcohol use: No  ?  Comment: occasional  ? Drug use: No  ? ? ? ?Allergies   ?Codeine, Doxycycline, and Neosporin [neomycin-bacitracin zn-polymyx] ? ? ?Review of Systems ?Review of Systems ?Pertinent negatives listed in HPI  ? ?Physical Exam ?Triage Vital Signs ?ED Triage Vitals [10/31/21 1417]  ?Enc Vitals Group  ?   BP 118/77  ?   Pulse Rate 96  ?   Resp 18  ?   Temp 98.8 ?F (37.1 ?C)  ?   Temp Source Oral  ?   SpO2 97 %  ?   Weight   ?   Height   ?   Head Circumference   ?   Peak Flow   ?   Pain Score 4  ?   Pain Loc   ?   Pain Edu?   ?   Excl. in [place]?   ? ?No data found. ? ?Updated Vital Signs ?BP 118/77 (BP Location: Left Arm)   Pulse 96   Temp 98.8 ?F (37.1 ?C) (Oral)   Resp 18   SpO2 97%  ? ?Visual Acuity ?Right Eye Distance:   ?Left Eye Distance:   ?Bilateral Distance:   ? ?Right Eye Near:   ?Left Eye Near:    ?Bilateral Near:    ? ?Physical Exam ?Constitutional:   ?   Appearance: Normal appearance.  ?HENT:  ?   Head: Normocephalic and atraumatic.  ?   Nose: Nose normal.  ?Eyes:  ?   Extraocular Movements: Extraocular movements intact.  ?   Pupils: Pupils are equal, round, and reactive to light.  ?Cardiovascular:  ?   Rate and Rhythm: Normal rate and regular rhythm.  ?Pulmonary:  ?   Effort: Pulmonary effort is normal.  ?   Breath sounds: Normal breath sounds.  ?Abdominal:  ?   General: Bowel sounds are normal.  ?Skin: ?   General: Skin is warm.  ?Neurological:  ?   General: No focal deficit present.  ?   Mental Status: She is alert and oriented to person, place, and time.  ?Psychiatric:     ?   Mood and Affect: Mood normal.     ?   Behavior: Behavior normal.     ?  Thought Content: Thought  content normal.     ?   Judgment: Judgment normal.  ? ? ? ?UC Treatments / Results  ?Labs ?(all labs ordered are listed, but only abnormal results are displayed) ?Labs Reviewed - No data to display ? ?EKG ? ? ?Radiology ?No results found. ? ?Procedures ?Procedures (including critical care time) ? ?Medications Ordered in UC ?Medications - No data to display ? ?Initial Impression / Assessment and Plan / UC Course  ?I have reviewed the triage vital signs and the nursing notes. ? ?Pertinent labs & imaging results that were available during my care of the patient were reviewed by me and considered in my medical decision making (see chart for details). ? ?  ?Viral Gastroenteritis ?Treatment with Lomotil and Zofran ?Hydrate well with fluids. ?Return as needed. ?Final Clinical Impressions(s) / UC Diagnoses  ? ?Final diagnoses:  ?Viral gastroenteritis  ? ?Discharge Instructions   ?None ?  ? ?ED Prescriptions   ? ? Medication Sig Dispense Auth. Provider  ? diphenoxylate-atropine (LOMOTIL) 2.5-0.025 MG tablet Take 1-2 tablets by mouth 4 (four) times daily as needed for diarrhea or loose stools. 20 tablet Scot Jun, FNP  ? ondansetron (ZOFRAN) 4 MG tablet Take 1 tablet (4 mg total) by mouth every 6 (six) hours. 12 tablet Scot Jun, FNP  ? ?  ? ?PDMP not reviewed this encounter. ?  ?Scot Jun, FNP ?11/04/21 2045 ? ?

## 2021-10-31 NOTE — ED Triage Notes (Signed)
Pt c/o diarrhea and loss of appetite x 4 days.  ?

## 2021-12-31 DIAGNOSIS — R102 Pelvic and perineal pain: Secondary | ICD-10-CM | POA: Insufficient documentation

## 2022-01-12 ENCOUNTER — Ambulatory Visit: Payer: BC Managed Care – PPO | Admitting: Dermatology

## 2022-01-18 NOTE — Progress Notes (Unsigned)
Patient: [name]  Service Category: E/M  Provider: Gaspar Cola, MD  DOB: 09/18/1970  DOS: 01/19/2022  Referring Provider: Vladimir Crofts, MD  MRN: [id number]  Setting: Ambulatory outpatient  PCP: Harrison Mons, PA  Type: New Patient  Specialty: Interventional Pain Management    Location: Office  Delivery: Face-to-face     Primary Reason(s) for Visit: Encounter for initial evaluation of one or more chronic problems (new to examiner) potentially causing chronic pain, and posing a threat to normal musculoskeletal function. (Level of risk: High) CC: Shoulder Pain (both)  HPI  Ms. Rudge is a 51 y.o. year old, female patient, who comes for the first time to our practice referred by Vladimir Crofts, MD for our initial evaluation of her chronic pain. She has STREP THROAT; GENITAL HERPES; HLD (hyperlipidemia); Persistent insomnia; GERD; BULIMIA, HX OF; Fibromyalgia; Chronic neck pain with history of cervical spinal surgery; Cervical radicular pain; Shortness of breath; Arthritis; Hot flashes; Opioid type dependence, continuous (Opal); Depression with anxiety; Fatigue; Chronic chest wall pain (rib pain) (4th area of Pain) (Bilateral) (R>L); ESR raised; Polyarthralgia; S/P cervical spinal fusion; Chronic prescription benzodiazepine use; BMI 33.0-33.9,adult; Chronic prescription opiate use; Costochondritis; History of bulimia nervosa; Pain in pelvis; Referred otalgia of right ear; Restless legs syndrome (RLS); Thrombocytosis; Vestibular migraine; Vitamin B 12 deficiency; Pain syndrome, chronic; Genital herpes; Arthralgia of right temporomandibular joint; Chronic pain syndrome; Pharmacologic therapy; Disorder of skeletal system; Problems influencing health status; Abnormal MRI, cervical spine (12/26/2009); Cervical facet arthropathy; Grade 1 Anterolisthesis of cervical spine (1 mm) (C3/C4); Thoracic spondylosis without myelopathy; Chronic shoulder pain (Bilateral) (R>L); Chronic upper extremity pain  (Bilateral) (R>L); Cervicalgia; Chronic low back pain (3ry area of Pain) (Bilateral) w/o sciatica; Chronic upper back pain (1ry area of Pain) (Bilateral) (R>L); Chronic thoracic back pain (Bilateral) (R>L); Chronic neck pain (2ry area of Pain) (posterior) (Bilateral) (R>L); Chronic thigh pain (5th area of Pain) (Bilateral) (L>R); Chronic knee pain (6th area of Pain) (Bilateral) (L>R); and Chronic generalized pain on their problem list. Today she comes in for evaluation of her Shoulder Pain (both)  Pain Assessment: Location: Right, Left Shoulder (neck and back) Radiating: pain radiaties everywhere Onset: More than a month ago Duration: Chronic pain Quality: Tingling, Aching, Burning, Nagging, Sharp, Constant Severity: 3 /10 (subjective, self-reported pain score)  Effect on ADL: limits my daily activities Timing: Constant Modifying factors: repositioning, resting, meds, BP: (!) 135/91  HR: 93  Onset and Duration: Gradual and Present longer than 3 months Cause of pain:  fibromyalgia Severity: No change since onset, NAS-11 at its worse: 8/10, NAS-11 at its best: 2/10, NAS-11 now: 4/10, and NAS-11 on the average: 3/10 Timing: Morning, Night, and After a period of immobility Aggravating Factors: Bending, Kneeling, Prolonged sitting, and Prolonged standing Alleviating Factors: Acupuncture, Biofeedback, Stretching, Medications, Resting, Sitting, Sleeping, Standing, Relaxation therapy, Warm showers or baths, and Walking Associated Problems: Night-time cramps, Depression, Dizziness, Fatigue, Inability to concentrate, Nausea, Numbness, Personality changes, Sweating, Temperature changes, Tingling, Weakness, and Pain that wakes patient up Quality of Pain: Aching, Annoying, Burning, Constant, Exhausting, Feeling of weight, Heavy, Horrible, Hot, Nagging, Punishing, Shooting, Stabbing, Tender, Tiring, and Uncomfortable Previous Examinations or Tests: Endoscopy, MRI scan, Nerve conduction test, Orthopedic  evaluation, and Psychiatric evaluation Previous Treatments: Biofeedback, Chiropractic manipulations, Epidural steroid injections, Physical Therapy, Pool exercises, Stretching exercises, TENS, and Trigger point injections  According to the patient the primary area of pain is that of the upper back (upper thoracic region) (Bilateral) (R>L).  The patient denies  any prior surgeries in that area, any recent x-rays, or nerve blocks.  She does indicate having had some physical therapy in the form of massage more than 3 years ago which did seem to help.  The patient also indicated that around that time she also had some dry needling but that one did not help.  According to my review of the medical records, the patient has had some thoracic epidural steroid injections done by Almedia Balls, MD, around 2011.  The patient's secondary area pain is that of the neck (posterior) (Bilateral) (R>L).  The patient does indicate having had surgery in the cervical spine around 2014 which consisted of a C2-3 fusion.  At that time she was experiencing significant decreased range of motion with a pinching sensation when attempting to move the neck.  She describes that the surgery completely eliminated that and she is extremely happy with the results.  The patient denies any physical therapy, x-rays, or nerve blocks in the cervical region.  The patient does have a cervical MRI on record that apparently was done on 12/26/2009 in [facility] radiology.  There is a report indicates the patient to have a 1 mm anterolisthesis at C3/C4 with left-sided facet arthropathy that could be irritating the left C4 nerve root.  The patient's third area pain is that of the mid to lower back (Bilateral) (L>R).  She denies any prior surgeries, recent x-rays, physical therapy, or having had any nerve blocks.  The patient's fourth area pain is that of the chest wall and rib cage (Bilateral) (R>L).  She describes this pain to be intermittent and usually  associated with her anxiety.  She denies any surgeries, recent x-rays, physical therapy, or any nerve blocks for that pain.  The patient's fifth area pain is that of the anterior thighs (Bilateral) (L>R).  The patient denies any surgeries in that region, she also denies any physical therapy, recent x-rays, or any nerve blocks or any other type of injections for that pain.  The patient's sixth area pain is that of the knees (Bilateral) (L>R).  The patient indicates having had 3 knee surgeries with 2 of them being on the left side and one on the right.  The last surgery that she had done on the knees was around 2021.  She denies any recent x-rays, but she does admit having had physical therapy after each one of the surgeries for approximately 3 months.  She also indicates that this physical therapy did help the knee pain.  She also admits having had some steroid injections prior to the surgeries.  She describes having had at least 1 steroid injection in the right knee and 3 on the left knee.  She also indicates that the intra-articular steroid knee injections did help for approximately 3 months at a time.  The patient's seventh area pain is that of the upper extremities where she actually has been experiencing more numbness and tingling than pain.  This upper extremity pain is bilateral (R>L).  The pain is also constant and she indicates having experienced some weakness in the right upper extremity.  She is right-handed.  The patient indicates having had a nerve conduction test (NCT) done at [facility] around 2015 after she had her cervical surgery and she was told that she had some positive findings where they indicated that she was having some type of a problem that was causing some of the upper extremity symptoms.  The patient's eighth area of pain is that of vestibular  headaches which are currently being treated by Dr. Jennings Books at the [facility] neurology department.  In addition to the  above, the patient also indicates having generalized pain and discomfort secondary to fibromyalgia which was diagnosed in 2006.  She refers that physical therapy tends to help with that pain, but it wears her down.  She also describes having PTSD, insomnia, fatigue, and memory problems.  Pharmacotherapy: The patient indicates primarily relying on Advil for the headaches.  She also describes using Percocet (oxycodone/APAP) (5/325) with an average use of 2 tablets/month.  She also indicates due in the fall they change in the weather usually causes her to use more medicine and when that happens she averages approximately 5 pills/month.  In addition to that, the patient describes taking Flexeril 10 mg p.o. at bedtime, Klonopin, and Ambien every night to help her sleep.  She also indicates that she is being evaluated for sleep problems such as sleep apnea.  She admits to snoring.  In addition to the above medication the patient also admits to taking Cymbalta 30 mg 1 tablet p.o. daily in AM, vitamin D, magnesium, and multivitamins.  The patient also describes having tried membrane stabilizers in the form of Lyrica but she had to stop it secondary to palpitations.  Asides from medications, she also regularly uses massage and a hot tub to treat her pain.  Today I took the time to provide the patient with information regarding my pain practice. The patient was informed that my practice is divided into two sections: an interventional pain management section, as well as a completely separate and distinct medication management section. I explained that I have procedure days for my interventional therapies, and evaluation days for follow-ups and medication management. Because of the amount of documentation required during both, they are kept separated. This means that there is the possibility that she may be scheduled for a procedure on one day, and medication management the next. I have also informed her that because of  staffing and facility limitations, I no longer take patients for medication management only. To illustrate the reasons for this, I gave the patient the example of surgeons, and how inappropriate it would be to refer a patient to his/her care, just to write for the post-surgical antibiotics on a surgery done by a different surgeon.   Because interventional pain management is my board-certified specialty, the patient was informed that joining my practice means that they are open to any and all interventional therapies. I made it clear that this does not mean that they will be forced to have any procedures done. What this means is that I believe interventional therapies to be essential part of the diagnosis and proper management of chronic pain conditions. Therefore, patients not interested in these interventional alternatives will be better served under the care of a different practitioner.  The patient was also made aware of my Comprehensive Pain Management Safety Guidelines where by joining my practice, they limit all of their nerve blocks and joint injections to those done by our practice, for as long as we are retained to manage their care.   Historic Controlled Substance Pharmacotherapy Review  PMP and historical list of controlled substances: Clonazepam 0.5 mg tablet 1 tab p.o. daily; zolpidem ER 12.5 mg tablet, 1 tab p.o. at bedtime; oxycodone/APAP 5/325 1 tab p.o. daily Current opioid analgesics:   None MME/day: 0 mg/day  Historical Monitoring: The patient  reports no history of drug use. List of all UDS Test(s):  No results found for: MDMA, COCAINSCRNUR, PCPSCRNUR, PCPQUANT, CANNABQUANT, THCU, [place] List of other Serum/Urine Drug Screening Test(s):  No results found for: AMPHSCRSER, BARBSCRSER, BENZOSCRSER, COCAINSCRSER, COCAINSCRNUR, PCPSCRSER, PCPQUANT, THCSCRSER, THCU, CANNABQUANT, OPIATESCRSER, OXYSCRSER, PROPOXSCRSER, ETH Historical Background Evaluation: [place] PMP: PDMP reviewed during this  encounter. Online review of the past 66-month period conducted.             PMP NARX Score Report:  Narcotic: 300 Sedative: 380 Stimulant: 000 [place] Department of public safety, offender search: Editor, commissioning Information) Non-contributory Risk Assessment Profile: Aberrant behavior: None observed or detected today Risk factors for fatal opioid overdose: None identified today PMP NARX Overdose Risk Score: 180 Fatal overdose hazard ratio (HR): Calculation deferred Non-fatal overdose hazard ratio (HR): Calculation deferred Risk of opioid abuse or dependence: 0.7-3.0% with doses ? 36 MME/day and 6.1-26% with doses ? 120 MME/day. Substance use disorder (SUD) risk level: See below Personal History of Substance Abuse (SUD-Substance use disorder):  Alcohol:    Illegal Drugs:    Rx Drugs:    ORT Risk Level calculation:    ORT Scoring interpretation table:  Score <3 = Low Risk for SUD  Score between 4-7 = Moderate Risk for SUD  Score >8 = High Risk for Opioid Abuse   PHQ-2 Depression Scale:  Total score:    PHQ-2 Scoring interpretation table: (Score and probability of major depressive disorder)  Score 0 = No depression  Score 1 = 15.4% Probability  Score 2 = 21.1% Probability  Score 3 = 38.4% Probability  Score 4 = 45.5% Probability  Score 5 = 56.4% Probability  Score 6 = 78.6% Probability   PHQ-9 Depression Scale:  Total score:    PHQ-9 Scoring interpretation table:  Score 0-4 = No depression  Score 5-9 = Mild depression  Score 10-14 = Moderate depression  Score 15-19 = Moderately severe depression  Score 20-27 = Severe depression (2.4 times higher risk of SUD and 2.89 times higher risk of overuse)   Pharmacologic Plan: As per protocol, I have not taken over any controlled substance management, pending the results of ordered tests and/or consults.            Initial impression: Pending review of available data and ordered tests.  Meds   Current Outpatient Medications:    clonazePAM  (KLONOPIN) 0.5 MG tablet, Take 1 tablet (0.5 mg total) by mouth every evening., Disp: 30 tablet, Rfl: 5   cyclobenzaprine (FLEXERIL) 10 MG tablet, TAKE 1 TABLET BY MOUTH AT BEDTIME AS NEEDED MUSCLE SPASMS, Disp: 30 tablet, Rfl: 4   DULoxetine (CYMBALTA) 30 MG capsule, Take 1 capsule (30 mg total) by mouth daily., Disp: 90 capsule, Rfl: 3   EMGALITY 120 MG/ML SOAJ, SMARTSIG:1 Milliliter(s) SUB-Q Every 4 Weeks, Disp: , Rfl:    lansoprazole (PREVACID) 30 MG capsule, TAKE 1 CAPSULE BY MOUTH EVERY DAY, Disp: 90 capsule, Rfl: 3   meclizine (ANTIVERT) 25 MG tablet, meclizine 25 mg tablet, Disp: , Rfl:    ondansetron (ZOFRAN) 4 MG tablet, Take 1 tablet (4 mg total) by mouth every 6 (six) hours., Disp: 12 tablet, Rfl: 0   ondansetron (ZOFRAN-ODT) 8 MG disintegrating tablet, Take by mouth., Disp: , Rfl:    oxyCODONE-acetaminophen (PERCOCET) 5-325 MG tablet, Take 1 tablet by mouth daily as needed for severe pain., Disp: 30 tablet, Rfl: 0   simvastatin (ZOCOR) 20 MG tablet, SMARTSIG:1 Tablet(s) By Mouth Every Evening, Disp: , Rfl:    valACYclovir (VALTREX) 500 MG tablet, Take 1 tablet (500 mg total)  by mouth 2 (two) times daily as needed., Disp: 180 tablet, Rfl: 1   zolpidem (AMBIEN CR) 12.5 MG CR tablet, Take 1 tablet (12.5 mg total) by mouth at bedtime., Disp: 30 tablet, Rfl: 5  Imaging Review  Lumbosacral Imaging: Lumbar DG Epidurogram OP: Results for orders placed during the hospital encounter of 02/26/10 DG Epidurography  Narrative Clinical Data:  Spondylosis without myelopathy.  Moderate improvement from the initial injections but with some persistent pain in the right scapular region.  Fluoroscopy Time: 53 seconds  Thoracic EPIDURAL INJECTION  The overlying skin was scrubbed with Betadine and draped in sterile fashion.  Skin anesthesia was carried out using 1% Lidocaine. An interlaminar approach was performed on the right at T7-8. A 20 gauge Crawford epidural needle was advanced using  loss-of- resistance technique.  DIAGNOSTIC EPIDURAL INJECTION  Injection of Omnipaque 300 shows a good epidural pattern with spread above and below the level of needle placement, primarily on the right. No vascular opacification is seen.  THERAPEUTIC EPIDURAL INJECTION  1.5 cc of Kenalog 40 mixed with 1 cc of 1% Lidocaine and 2 cc of normal saline were then instilled.  The procedure was well- tolerated, and the patient was discharged thirty minutes following the injection in good condition.  IMPRESSION: Technically successful third epidural injection on the right at T7- 8  Provider: Dawayne Cirri  Ankle Imaging: Ankle-R DG Complete: Results for orders placed during the hospital encounter of 10/27/12 DG Ankle Complete Right  Narrative *RADIOLOGY REPORT*  Clinical Data: Fall.  Lateral ankle pain and swelling.  RIGHT ANKLE - COMPLETE 3+ VIEW  Comparison: None.  Findings: Moderate soft tissue swelling is seen overlying the lateral malleolus.  No evidence of fracture or dislocation.  No evidence of ankle joint effusion.  No other significant bone abnormality identified.  IMPRESSION: Lateral soft tissue swelling.  No evidence of fracture.   Original Report Authenticated By: Earle Gell, M.D.  Foot Imaging: Foot-L DG Complete: Results for orders placed during the hospital encounter of 07/24/17 DG Foot Complete Left  Narrative CLINICAL DATA:  Spontaneous lateral foot pain.  EXAM: LEFT FOOT - COMPLETE 3+ VIEW  COMPARISON:  12/22/2015  FINDINGS: Plantar calcaneal spur with a vertical linear lucency concerning for nondisplaced fracture through the enthesophyte which is new compared with 12/22/2015. No other acute fracture or dislocation. No aggressive osseous lesion.  IMPRESSION: 1. Plantar calcaneal spur with a vertical linear lucency concerning for nondisplaced fracture through the enthesophyte which is new compared with 12/22/2015.   Electronically  Signed By: Kathreen Devoid On: 07/24/2017 13:59  Wrist Imaging: Wrist-L DG Complete: Results for orders placed during the hospital encounter of 03/02/10 DG Wrist Complete Left  Narrative Clinical Data: Wrist injury and pain.  LEFT WRIST - COMPLETE 3+ VIEW  Comparison:  None.  Findings:  There is no evidence of fracture or dislocation.  There is no evidence of arthropathy or other focal bone abnormality. Soft tissues are unremarkable.  IMPRESSION: Negative.  Provider: Felipa Eth  Complexity Note: Imaging results reviewed. Results shared with Ms. Wasilewski, using Layman's terms.                        ROS  Cardiovascular: Abnormal heart rhythm, Chest pain, and Needs antibiotics prior to dental procedures Pulmonary or Respiratory: Snoring  Neurological: No reported neurological signs or symptoms such as seizures, abnormal skin sensations, urinary and/or fecal incontinence, being born with an abnormal open spine and/or a tethered spinal cord Psychological-Psychiatric:  Anxiousness, Depressed, and Difficulty sleeping and or falling asleep Gastrointestinal: Reflux or heatburn Genitourinary: Peeing blood Hematological: No reported hematological signs or symptoms such as prolonged bleeding, low or poor functioning platelets, bruising or bleeding easily, hereditary bleeding problems, low energy levels due to low hemoglobin or being anemic Endocrine: No reported endocrine signs or symptoms such as high or low blood sugar, rapid heart rate due to high thyroid levels, obesity or weight gain due to slow thyroid or thyroid disease Rheumatologic: Generalized muscle aches (Fibromyalgia) and Constant unexplained fatigue (Chronic Fatigue Syndrome) Musculoskeletal: Negative for myasthenia gravis, muscular dystrophy, multiple sclerosis or malignant hyperthermia Work History: Working full time  Allergies  Ms. Roper is allergic to codeine, doxycycline, and neosporin [neomycin-bacitracin  zn-polymyx].  Laboratory Chemistry Profile   Renal Lab Results  Component Value Date   BUN 11 06/12/2017   CREATININE 0.74 06/12/2017   GFR 89.79 06/12/2017   GFRAA >60 10/24/2011   GFRNONAA >60 10/24/2011   SPECGRAV 1.025 05/13/2016   PHUR 6.0 05/13/2016   PROTEINUR Negative 05/13/2016     Electrolytes Lab Results  Component Value Date   NA 139 06/12/2017   K 4.1 06/12/2017   CL 102 06/12/2017   CALCIUM 9.2 06/12/2017     Hepatic Lab Results  Component Value Date   AST 20 06/12/2017   ALT 16 06/12/2017   ALBUMIN 4.4 06/12/2017   ALKPHOS 61 06/12/2017     ID Lab Results  Component Value Date   STAPHAUREUS  05/26/2010    NEGATIVE        The Xpert SA Assay (FDA approved for NASAL specimens only), is one component of a comprehensive surveillance program.  It is not intended to diagnose infection nor to guide or monitor treatment.   MRSAPCR NEGATIVE 05/26/2010     Bone Lab Results  Component Value Date   VD25OH 30.48 04/12/2019     Endocrine Lab Results  Component Value Date   GLUCOSE 85 06/12/2017   GLUCOSEU Negative 09/11/2011   HGBA1C 6.0 05/10/2010   TSH 0.71 04/10/2018   FREET4 0.74 04/07/2011     Neuropathy Lab Results  Component Value Date   VITAMINB12 279 04/10/2018   HGBA1C 6.0 05/10/2010     CNS No results found for: COLORCSF, APPEARCSF, RBCCOUNTCSF, WBCCSF, POLYSCSF, LYMPHSCSF, EOSCSF, PROTEINCSF, GLUCCSF, JCVIRUS, CSFOLI, IGGCSF, LABACHR, ACETBL, LABACHR, ACETBL   Inflammation (CRP: Acute  ESR: Chronic) Lab Results  Component Value Date   CRP 1.4 03/18/2019   ESRSEDRATE 42 (H) 03/18/2019     Rheumatology Lab Results  Component Value Date   RF <14 03/18/2019     Coagulation Lab Results  Component Value Date   PLT 419.0 (H) 04/10/2018     Cardiovascular Lab Results  Component Value Date   HGB 14.2 04/10/2018   HCT 41.4 04/10/2018     Screening Lab Results  Component Value Date   STAPHAUREUS  05/26/2010     NEGATIVE        The Xpert SA Assay (FDA approved for NASAL specimens only), is one component of a comprehensive surveillance program.  It is not intended to diagnose infection nor to guide or monitor treatment.   MRSAPCR NEGATIVE 05/26/2010     Cancer No results found for: CEA, CA125, LABCA2   Allergens No results found for: ALMOND, APPLE, ASPARAGUS, AVOCADO, BANANA, BARLEY, BASIL, BAYLEAF, GREENBEAN, LIMABEAN, WHITEBEAN, BEEFIGE, REDBEET, BLUEBERRY, BROCCOLI, CABBAGE, MELON, CARROT, CASEIN, CASHEWNUT, CAULIFLOWER, CELERY     Note: Lab results reviewed.  PFSH  Drug:  Ms. Franzoni  reports no history of drug use. Alcohol:  reports no history of alcohol use. Tobacco:  reports that she has never smoked. She has never used smokeless tobacco. Medical:  has a past medical history of Anemia, Bulging disc, Bulimia nervosa, Fibromyalgia, Genital herpes, GERD (gastroesophageal reflux disease), Hyperlipidemia, and PONV (postoperative nausea and vomiting). Family: family history includes Breast cancer (age of onset: 76) in her maternal aunt; Lung cancer in her maternal uncle and mother; Melanoma in her sister; Prostate cancer in her paternal uncle; Stomach cancer in her paternal uncle; Stroke in her father.  Past Surgical History:  Procedure Laterality Date   ABDOMINAL HYSTERECTOMY     endometriosis   CESAREAN SECTION     x1   NECK SURGERY     PAROTIDECTOMY  08/16/2007   benign tumor   ROTATOR CUFF REPAIR     ws[place] tooth     x 1- age 36   Active Ambulatory Problems    Diagnosis Date Noted   STREP THROAT 02/09/2010   GENITAL HERPES 01/22/2010   HLD (hyperlipidemia) 05/05/2010   Persistent insomnia 01/22/2010   GERD 01/22/2010   BULIMIA, HX OF 01/22/2010   Fibromyalgia 04/07/2011   Chronic neck pain with history of cervical spinal surgery 11/24/2011   Cervical radicular pain 11/24/2011   Shortness of breath 08/20/2013   Arthritis 07/15/2015   Hot flashes 07/15/2015   Opioid type  dependence, continuous (HCC) 11/30/2017   Depression with anxiety 04/10/2018   Fatigue 04/10/2018   Chronic chest wall pain (rib pain) (4th area of Pain) (Bilateral) (R>L) 03/19/2019   ESR raised 03/19/2019   Polyarthralgia 03/19/2019   S/P cervical spinal fusion 03/19/2019   Chronic prescription benzodiazepine use 10/23/2019   BMI 33.0-33.9,adult 06/24/2021   Chronic prescription opiate use 11/30/2017   Costochondritis 06/23/2018   History of bulimia nervosa 01/22/2010   Pain in pelvis 12/31/2021   Referred otalgia of right ear 05/08/2018   Restless legs syndrome (RLS) 11/01/2019   Thrombocytosis 04/17/2019   Vestibular migraine 06/24/2021   Vitamin B 12 deficiency 12/02/2019   Pain syndrome, chronic 04/17/2019   Genital herpes 01/22/2010   Arthralgia of right temporomandibular joint 05/08/2018   Chronic pain syndrome 01/19/2022   Pharmacologic therapy 01/19/2022   Disorder of skeletal system 01/19/2022   Problems influencing health status 01/19/2022   Abnormal MRI, cervical spine (12/26/2009) 01/19/2022   Cervical facet arthropathy 01/19/2022   Grade 1 Anterolisthesis of cervical spine (1 mm) (C3/C4) 01/19/2022   Thoracic spondylosis without myelopathy 01/19/2022   Chronic shoulder pain (Bilateral) (R>L) 01/19/2022   Chronic upper extremity pain (Bilateral) (R>L) 01/19/2022   Cervicalgia 01/19/2022   Chronic low back pain (3ry area of Pain) (Bilateral) w/o sciatica 01/19/2022   Chronic upper back pain (1ry area of Pain) (Bilateral) (R>L) 01/19/2022   Chronic thoracic back pain (Bilateral) (R>L) 01/19/2022   Chronic neck pain (2ry area of Pain) (posterior) (Bilateral) (R>L) 01/19/2022   Chronic thigh pain (5th area of Pain) (Bilateral) (L>R) 01/19/2022   Chronic knee pain (6th area of Pain) (Bilateral) (L>R) 01/19/2022   Chronic generalized pain 01/19/2022   Resolved Ambulatory Problems    Diagnosis Date Noted   HERNIATED LUMBOSACRAL DISC 01/22/2010   SYNCOPE 02/09/2010    FATIGUE 01/22/2010   Fatigue 04/07/2011   Difficulty concentrating 06/07/2011   Pyelonephritis 09/20/2011   Dysuria 11/24/2011   Dysentery 09/06/2012   Vertigo 09/21/2012   Acute sinusitis 01/18/2013   Tick bite 01/18/2013   Finger injury 05/10/2013  Benign paroxysmal positional vertigo 07/15/2015   Pain of left heel 12/22/2015   Well woman exam 04/28/2016   Palpitations 04/10/2018   Chest pressure 05/29/2018   Past Medical History:  Diagnosis Date   Anemia    Bulging disc    Bulimia nervosa    Genital herpes    GERD (gastroesophageal reflux disease)    Hyperlipidemia    PONV (postoperative nausea and vomiting)    Constitutional Exam  General appearance: Well nourished, well developed, and well hydrated. In no apparent acute distress Vitals:   01/19/22 1031  BP: (!) 135/91  Pulse: 93  Temp: (!) 97.1 F (36.2 C)  SpO2: 100%  Weight: 196 lb (88.9 kg)  Height: $Remove'5\' 5"'yOwWWjl$  (1.651 m)   BMI Assessment: Estimated body mass index is 32.62 kg/m as calculated from the following:   Height as of this encounter: $RemoveBeforeD'5\' 5"'tNtXLJsqbazZzR$  (1.651 m).   Weight as of this encounter: 196 lb (88.9 kg).  BMI interpretation table: BMI level Category Range association with higher incidence of chronic pain  <18 kg/m2 Underweight   18.5-24.9 kg/m2 Ideal body weight   25-29.9 kg/m2 Overweight Increased incidence by 20%  30-34.9 kg/m2 Obese (Class I) Increased incidence by 68%  35-39.9 kg/m2 Severe obesity (Class II) Increased incidence by 136%  >40 kg/m2 Extreme obesity (Class III) Increased incidence by 254%   Patient's current BMI Ideal Body weight  Body mass index is 32.62 kg/m. Ideal body weight: 57 kg (125 lb 10.6 oz) Adjusted ideal body weight: 69.8 kg (153 lb 12.8 oz)   BMI Readings from Last 4 Encounters:  01/19/22 32.62 kg/m  09/29/21 32.96 kg/m  09/16/21 31.95 kg/m  10/23/19 33.90 kg/m   Wt Readings from Last 4 Encounters:  01/19/22 196 lb (88.9 kg)  09/29/21 192 lb (87.1 kg)  09/16/21  192 lb (87.1 kg)  10/23/19 197 lb 8 oz (89.6 kg)    Psych/Mental status: Alert, oriented x 3 (person, place, & time)       Eyes: PERLA Respiratory: No evidence of acute respiratory distress  Assessment  Primary Diagnosis & Pertinent Problem List: The primary encounter diagnosis was Chronic pain syndrome. Diagnoses of Chronic upper back pain (1ry area of Pain) (Bilateral) (R>L), Chronic neck pain (2ry area of Pain) (posterior) (Bilateral) (R>L), Chronic low back pain (3ry area of Pain) (Bilateral) w/o sciatica, Chronic chest wall pain (rib pain) (4th area of Pain) (Bilateral) (R>L), Chronic thigh pain (5th area of Pain) (Bilateral) (L>R), Chronic knee pain (6th area of Pain) (Bilateral) (L>R), Cervical facet arthropathy, Grade 1 Anterolisthesis of cervical spine (1 mm) (C3/C4), Thoracic spondylosis without myelopathy, Chronic shoulder pain (Bilateral), Chronic upper extremity pain (Bilateral) (R>L), Chronic neck pain with history of cervical spinal surgery, Cervicalgia, Chronic thoracic back pain (Bilateral) (R>L), Fibromyalgia, Chronic generalized pain, Abnormal MRI, cervical spine (12/26/2009), Pharmacologic therapy, Disorder of skeletal system, and Problems influencing health status were also pertinent to this visit.  Visit Diagnosis (New problems to examiner): 1. Chronic pain syndrome   2. Chronic upper back pain (1ry area of Pain) (Bilateral) (R>L)   3. Chronic neck pain (2ry area of Pain) (posterior) (Bilateral) (R>L)   4. Chronic low back pain (3ry area of Pain) (Bilateral) w/o sciatica   5. Chronic chest wall pain (rib pain) (4th area of Pain) (Bilateral) (R>L)   6. Chronic thigh pain (5th area of Pain) (Bilateral) (L>R)   7. Chronic knee pain (6th area of Pain) (Bilateral) (L>R)   8. Cervical facet arthropathy   9. Grade  1 Anterolisthesis of cervical spine (1 mm) (C3/C4)   10. Thoracic spondylosis without myelopathy   11. Chronic shoulder pain (Bilateral)   12. Chronic upper extremity  pain (Bilateral) (R>L)   13. Chronic neck pain with history of cervical spinal surgery   14. Cervicalgia   15. Chronic thoracic back pain (Bilateral) (R>L)   16. Fibromyalgia   17. Chronic generalized pain   18. Abnormal MRI, cervical spine (12/26/2009)   19. Pharmacologic therapy   20. Disorder of skeletal system   21. Problems influencing health status    Plan of Care (Initial workup plan)  Note: Ms. Lampton was reminded that as per protocol, today's visit has been an evaluation only. We have not taken over the patient's controlled substance management.  Problem-specific plan: No problem-specific Assessment & Plan notes found for this encounter.  Lab Orders         ToxASSURE Select 13 (MW), Urine         Comp. Metabolic Panel (12)         Magnesium         Vitamin B12         Sedimentation rate         25-Hydroxy vitamin D Lcms D2+D3         C-reactive protein     Imaging Orders         DG Cervical Spine With Flex & Extend         DG Shoulder Right         DG Shoulder Left         DG Lumbar Spine Complete W/Bend         DG Knee Complete 4 Views Right         DG Knee Complete 4 Views Left     Referral Orders  No referral(s) requested today   Procedure Orders    No procedure(s) ordered today   Pharmacotherapy (current): Medications ordered:  No orders of the defined types were placed in this encounter.  Medications administered during this visit: Rome Schlauch. Girardin had no medications administered during this visit.   Pharmacological management options:  Opioid Analgesics: The patient was informed that there is no guarantee that she would be a candidate for opioid analgesics. The decision will be made following CDC guidelines. This decision will be based on the results of diagnostic studies, as well as Ms. Knieriem's risk profile.   Membrane stabilizer: To be determined at a later time  Muscle relaxant: To be determined at a later time  NSAID: To be determined at a later  time  Other analgesic(s): To be determined at a later time   Interventional management options: Ms. Martello was informed that there is no guarantee that she would be a candidate for interventional therapies. The decision will be based on the results of diagnostic studies, as well as Ms. Dome's risk profile.  Procedure(s) under consideration:  Pending results of ordered studies      Interventional Therapies  Risk  Complexity Considerations:   Estimated body mass index is 32.62 kg/m as calculated from the following:   Height as of this encounter: $RemoveBeforeD'5\' 5"'dCldxIwywpHUBQ$  (1.651 m).   Weight as of this encounter: 196 lb (88.9 kg). WNL   Planned  Pending:   Pending further evaluation   Under consideration:   Diagnostic right cervical ESI #1    Completed:   None at this time   Completed by other providers:   Diagnostic/therapeutic right T8-9 thoracic ESI  x1 (12/04/2009) by Almedia Balls, MD  Diagnostic/therapeutic right T7-8 thoracic ESI x2 (01/15/2010 & 02/26/2010) by Almedia Balls, MD    Therapeutic  Palliative (PRN) options:   None established    Provider-requested follow-up: Return for (28min), Eval-day (M,W), (F2F), 2nd Visit, for review of ordered tests.  Future Appointments  Date Time Provider [place]  02/09/2022  2:40 PM Milinda Pointer, MD [facility] None     Note by: Gaspar Cola, MD Date: 01/19/2022; Time: 12:42 PM

## 2022-01-19 ENCOUNTER — Ambulatory Visit
Admission: RE | Admit: 2022-01-19 | Discharge: 2022-01-19 | Disposition: A | Discharge status: Home / Self Care | Payer: BC Managed Care – PPO | Source: Ambulatory Visit | Attending: Pain Medicine | Admitting: Pain Medicine

## 2022-01-19 ENCOUNTER — Ambulatory Visit
Admission: RE | Admit: 2022-01-19 | Discharge: 2022-01-19 | Disposition: A | Discharge status: Home / Self Care | Payer: BC Managed Care – PPO | Attending: Pain Medicine | Admitting: Pain Medicine

## 2022-01-19 ENCOUNTER — Encounter: Payer: Self-pay | Admitting: Pain Medicine

## 2022-01-19 ENCOUNTER — Other Ambulatory Visit: Payer: Self-pay | Admitting: Pain Medicine

## 2022-01-19 ENCOUNTER — Ambulatory Visit: Payer: BC Managed Care – PPO | Admitting: Pain Medicine

## 2022-01-19 VITALS — BP 135/91 | HR 93 | Temp 97.1°F | Ht 65.0 in | Wt 196.0 lb

## 2022-01-19 DIAGNOSIS — M25512 Pain in left shoulder: Secondary | ICD-10-CM

## 2022-01-19 DIAGNOSIS — R52 Pain, unspecified: Secondary | ICD-10-CM | POA: Insufficient documentation

## 2022-01-19 DIAGNOSIS — M79601 Pain in right arm: Secondary | ICD-10-CM | POA: Insufficient documentation

## 2022-01-19 DIAGNOSIS — M47814 Spondylosis without myelopathy or radiculopathy, thoracic region: Secondary | ICD-10-CM

## 2022-01-19 DIAGNOSIS — M4312 Spondylolisthesis, cervical region: Secondary | ICD-10-CM | POA: Insufficient documentation

## 2022-01-19 DIAGNOSIS — M79652 Pain in left thigh: Secondary | ICD-10-CM

## 2022-01-19 DIAGNOSIS — R0789 Other chest pain: Secondary | ICD-10-CM | POA: Insufficient documentation

## 2022-01-19 DIAGNOSIS — M47812 Spondylosis without myelopathy or radiculopathy, cervical region: Secondary | ICD-10-CM | POA: Insufficient documentation

## 2022-01-19 DIAGNOSIS — M25561 Pain in right knee: Secondary | ICD-10-CM | POA: Insufficient documentation

## 2022-01-19 DIAGNOSIS — Z9889 Other specified postprocedural states: Secondary | ICD-10-CM

## 2022-01-19 DIAGNOSIS — M545 Low back pain, unspecified: Secondary | ICD-10-CM | POA: Insufficient documentation

## 2022-01-19 DIAGNOSIS — R937 Abnormal findings on diagnostic imaging of other parts of musculoskeletal system: Secondary | ICD-10-CM | POA: Insufficient documentation

## 2022-01-19 DIAGNOSIS — G894 Chronic pain syndrome: Secondary | ICD-10-CM

## 2022-01-19 DIAGNOSIS — M79651 Pain in right thigh: Secondary | ICD-10-CM | POA: Insufficient documentation

## 2022-01-19 DIAGNOSIS — M549 Dorsalgia, unspecified: Secondary | ICD-10-CM | POA: Insufficient documentation

## 2022-01-19 DIAGNOSIS — Z789 Other specified health status: Secondary | ICD-10-CM | POA: Insufficient documentation

## 2022-01-19 DIAGNOSIS — M542 Cervicalgia: Secondary | ICD-10-CM | POA: Insufficient documentation

## 2022-01-19 DIAGNOSIS — M25562 Pain in left knee: Secondary | ICD-10-CM | POA: Insufficient documentation

## 2022-01-19 DIAGNOSIS — G8929 Other chronic pain: Secondary | ICD-10-CM | POA: Insufficient documentation

## 2022-01-19 DIAGNOSIS — G8928 Other chronic postprocedural pain: Secondary | ICD-10-CM

## 2022-01-19 DIAGNOSIS — Z79899 Other long term (current) drug therapy: Secondary | ICD-10-CM

## 2022-01-19 DIAGNOSIS — M79602 Pain in left arm: Secondary | ICD-10-CM | POA: Insufficient documentation

## 2022-01-19 DIAGNOSIS — M797 Fibromyalgia: Secondary | ICD-10-CM | POA: Insufficient documentation

## 2022-01-19 DIAGNOSIS — M899 Disorder of bone, unspecified: Secondary | ICD-10-CM

## 2022-01-19 DIAGNOSIS — M25511 Pain in right shoulder: Secondary | ICD-10-CM

## 2022-01-19 DIAGNOSIS — M546 Pain in thoracic spine: Secondary | ICD-10-CM | POA: Insufficient documentation

## 2022-01-19 NOTE — Patient Instructions (Signed)

## 2022-01-19 NOTE — Progress Notes (Signed)
Safety precautions to be maintained throughout the outpatient stay will include: orient to surroundings, keep bed in low position, maintain call bell within reach at all times, provide assistance with transfer out of bed and ambulation.  

## 2022-01-22 LAB — TOXASSURE SELECT 13 (MW), URINE

## 2022-01-24 LAB — MAGNESIUM: Magnesium: 2.2 mg/dL (ref 1.6–2.3)

## 2022-01-24 LAB — SEDIMENTATION RATE: Sed Rate: 51 mm/hr — ABNORMAL HIGH (ref 0–40)

## 2022-01-24 LAB — COMP. METABOLIC PANEL (12)
AST: 20 IU/L (ref 0–40)
Albumin/Globulin Ratio: 1.6 (ref 1.2–2.2)
Albumin: 4.7 g/dL (ref 3.8–4.8)
Alkaline Phosphatase: 105 IU/L (ref 44–121)
BUN/Creatinine Ratio: 17 (ref 9–23)
BUN: 15 mg/dL (ref 6–24)
Bilirubin Total: 0.2 mg/dL (ref 0.0–1.2)
Calcium: 10 mg/dL (ref 8.7–10.2)
Chloride: 101 mmol/L (ref 96–106)
Creatinine, Ser: 0.88 mg/dL (ref 0.57–1.00)
Globulin, Total: 3 g/dL (ref 1.5–4.5)
Glucose: 70 mg/dL (ref 70–99)
Potassium: 4.5 mmol/L (ref 3.5–5.2)
Sodium: 142 mmol/L (ref 134–144)
Total Protein: 7.7 g/dL (ref 6.0–8.5)
eGFR: 80 mL/min/{1.73_m2} (ref >59)

## 2022-01-24 LAB — C-REACTIVE PROTEIN: CRP: 11 mg/L — ABNORMAL HIGH (ref 0–10)

## 2022-01-24 LAB — VITAMIN B12: Vitamin B-12: 416 pg/mL (ref 232–1245)

## 2022-01-24 LAB — 25-HYDROXY VITAMIN D LCMS D2+D3
25-Hydroxy, Vitamin D-2: 8 ng/mL
25-Hydroxy, Vitamin D-3: 34 ng/mL
25-Hydroxy, Vitamin D: 42 ng/mL

## 2022-02-07 NOTE — Progress Notes (Signed)
PROVIDER NOTE: Information contained herein reflects review and annotations entered in association with encounter. Interpretation of such information and data should be left to medically-trained personnel. Information provided to patient can be located elsewhere in the medical record under "Patient Instructions". Document created using STT-dictation technology, any transcriptional errors that may result from process are unintentional.    Patient: [name]  Service Category: E/M  Provider: Gaspar Cola, MD  DOB: June 10, 1971  DOS: 02/09/2022  Specialty: Interventional Pain Management  MRN: [id number]  Setting: Ambulatory outpatient  PCP: Harrison Mons, PA  Type: Established Patient    Referring Provider: Harrison Mons, PA  Location: Office  Delivery: Face-to-face     Primary Reason(s) for Visit: Encounter for evaluation before starting new chronic pain management plan of care (Level of risk: moderate) CC: Neck Pain  HPI  [name] is a 51 y.o. year old, female patient, who comes today for a follow-up evaluation to review the test results and decide on a treatment plan. She has STREP THROAT; GENITAL HERPES; HLD (hyperlipidemia); Persistent insomnia; GERD; BULIMIA, HX OF; Fibromyalgia; Chronic neck pain with history of cervical spinal surgery; Cervical radicular pain; Shortness of breath; Arthritis; Hot flashes; Opioid type dependence, continuous ([place]); Depression with anxiety; Fatigue; Chronic chest wall pain (rib pain) (4th area of Pain) (Bilateral) (R>L); Elevated sed rate; Polyarthralgia; History of cervical spinal surgery (ACDF C3-4); Chronic prescription benzodiazepine use; BMI 33.0-33.9,adult; Chronic prescription opiate use; Costochondritis; History of bulimia nervosa; Pain in pelvis; Referred otalgia of right ear; Restless legs syndrome (RLS); Thrombocytosis; Vestibular migraine; Vitamin B 12 deficiency; Pain syndrome, chronic; Genital herpes; Arthralgia of right temporomandibular  joint; Chronic pain syndrome; Pharmacologic therapy; Disorder of skeletal system; Problems influencing health status; Abnormal MRI, cervical spine (12/26/2009); Cervical facet arthropathy; Grade 1 Anterolisthesis of cervical spine (1 mm) (C3/C4); Thoracic spondylosis without myelopathy; Chronic shoulder pain (Bilateral) (R>L); Chronic upper extremity pain (Bilateral) (R>L); Cervicalgia; Chronic low back pain (3ry area of Pain) (Bilateral) w/o sciatica; Chronic upper back pain (1ry area of Pain) (Bilateral) (R>L); Chronic thoracic back pain (Bilateral) (R>L); Chronic neck pain (2ry area of Pain) (posterior) (Bilateral) (R>L); Chronic thigh pain (5th area of Pain) (Bilateral) (L>R); Chronic knee pain (6th area of Pain) (Bilateral) (L>R); Chronic generalized pain; Elevated C-reactive protein (CRP); Osteoarthritis of shoulder (Right); Osteoarthritis of shoulder (Left); Osteoarthritis of shoulders (Bilateral); DDD (degenerative disc disease), thoracic; DDD (degenerative disc disease), cervical; DDD (degenerative disc disease), lumbosacral; Lumbosacral facet arthropathy (L5-S1); Lumbosacral facet syndrome; and Plantar calcaneal spur of foot (Left) on their problem list. Her primarily concern today is the Neck Pain  Pain Assessment: Location: Left, Right Neck Radiating: pain radiaties toward both shoulder and upper back Onset: More than a month ago Duration: Chronic pain Quality: Constant, Burning Severity: 1 /10 (subjective, self-reported pain score)  Effect on ADL: limits my daily activities Timing: Constant Modifying factors: laying down, BP: 121/81  HR: 84  [name] comes in today for a follow-up visit after her initial evaluation on 01/19/2022. Today we went over the results of her tests. These were explained in "Layman's terms". During today's appointment we went over my diagnostic impression, as well as the proposed treatment plan.  Review of initial evaluation (01/19/2022): "According to the patient  the primary area of pain is that of the upper back (upper thoracic region) (Bilateral) (R>L).  The patient denies any prior surgeries in that area, any recent x-rays, or nerve blocks.  She does indicate having had some physical therapy in the form of  massage more than 3 years ago which did seem to help.  The patient also indicated that around that time she also had some dry needling but that one did not help.  According to my review of the medical records, the patient has had some thoracic epidural steroid injections done by Almedia Balls, MD, around 2011.  The patient's secondary area pain is that of the neck (posterior) (Bilateral) (R>L).  The patient does indicate having had surgery in the cervical spine around 2014 which consisted of a C2-3 fusion.  At that time she was experiencing significant decreased range of motion with a pinching sensation when attempting to move the neck.  She describes that the surgery completely eliminated that and she is extremely happy with the results.  The patient denies any physical therapy, x-rays, or nerve blocks in the cervical region.  The patient does have a cervical MRI on record that apparently was done on 12/26/2009 in [facility] radiology.  There is a report indicates the patient to have a 1 mm anterolisthesis at C3/C4 with left-sided facet arthropathy that could be irritating the left C4 nerve root.  The patient's third area pain is that of the mid to lower back (Bilateral) (L>R).  She denies any prior surgeries, recent x-rays, physical therapy, or having had any nerve blocks.  The patient's fourth area pain is that of the chest wall and rib cage (Bilateral) (R>L).  She describes this pain to be intermittent and usually associated with her anxiety.  She denies any surgeries, recent x-rays, physical therapy, or any nerve blocks for that pain.  The patient's fifth area pain is that of the anterior thighs (Bilateral) (L>R).  The patient denies any surgeries in that  region, she also denies any physical therapy, recent x-rays, or any nerve blocks or any other type of injections for that pain.  The patient's sixth area pain is that of the knees (Bilateral) (L>R).  The patient indicates having had 3 knee surgeries with 2 of them being on the left side and one on the right.  The last surgery that she had done on the knees was around 2021.  She denies any recent x-rays, but she does admit having had physical therapy after each one of the surgeries for approximately 3 months.  She also indicates that this physical therapy did help the knee pain.  She also admits having had some steroid injections prior to the surgeries.  She describes having had at least 1 steroid injection in the right knee and 3 on the left knee.  She also indicates that the intra-articular steroid knee injections did help for approximately 3 months at a time.  The patient's seventh area pain is that of the upper extremities where she actually has been experiencing more numbness and tingling than pain.  This upper extremity pain is bilateral (R>L).  The pain is also constant and she indicates having experienced some weakness in the right upper extremity.  She is right-handed.  The patient indicates having had a nerve conduction test (NCT) done at [facility] around 2015 after she had her cervical surgery and she was told that she had some positive findings where they indicated that she was having some type of a problem that was causing some of the upper extremity symptoms.  The patient's eighth area of pain is that of vestibular headaches which are currently being treated by Dr. Jennings Books at the [facility] neurology department.  In addition to the above, the patient also  indicates having generalized pain and discomfort secondary to fibromyalgia which was diagnosed in 2006.  She refers that physical therapy tends to help with that pain, but it wears her down.  She also describes having PTSD,  insomnia, fatigue, and memory problems.  Pharmacotherapy: The patient indicates primarily relying on Advil for the headaches.  She also describes using Percocet (oxycodone/APAP) (5/325) with an average use of 2 tablets/month.  She also indicates due in the fall they change in the weather usually causes her to use more medicine and when that happens she averages approximately 5 pills/month.  In addition to that, the patient describes taking Flexeril 10 mg p.o. at bedtime, Klonopin, and Ambien every night to help her sleep.  She also indicates that she is being evaluated for sleep problems such as sleep apnea.  She admits to snoring.  In addition to the above medication the patient also admits to taking Cymbalta 30 mg 1 tablet p.o. daily in AM, vitamin D, magnesium, and multivitamins.  The patient also describes having tried membrane stabilizers in the form of Lyrica but she had to stop it secondary to palpitations.  Asides from medications, she also regularly uses massage and a hot tub to treat her pain."  In considering the treatment plan options, Ms. Tarte was reminded that I no longer take patients for medication management only. I asked her to let me know if she had no intention of taking advantage of the interventional therapies, so that we could make arrangements to provide this space to someone interested. I also made it clear that undergoing interventional therapies for the purpose of getting pain medications is very inappropriate on the part of a patient, and it will not be tolerated in this practice. This type of behavior would suggest true addiction and therefore it requires referral to an addiction specialist.   Further details on both, my assessment(s), as well as the proposed treatment plan, please see below.  Controlled Substance Pharmacotherapy Assessment REMS (Risk Evaluation and Mitigation Strategy)  Opioid Analgesic: None MME/day: 0 mg/day  Pill Count: None expected due to no prior  prescriptions written by our practice. Chauncey Fischer, RN  02/09/2022  2:45 PM  Sign when Signing Visit Safety precautions to be maintained throughout the outpatient stay will include: orient to surroundings, keep bed in low position, maintain call bell within reach at all times, provide assistance with transfer out of bed and ambulation.    Pharmacokinetics: Liberation and absorption (onset of action): WNL Distribution (time to peak effect): WNL Metabolism and excretion (duration of action): WNL         Pharmacodynamics: Desired effects: Analgesia: [name] reports >50% benefit. Functional ability: Patient reports that medication allows her to accomplish basic ADLs Clinically meaningful improvement in function (CMIF): Sustained CMIF goals met Perceived effectiveness: Described as relatively effective, allowing for increase in activities of daily living (ADL) Undesirable effects: Side-effects or Adverse reactions: None reported Monitoring: [place] PMP: PDMP reviewed during this encounter. Online review of the past 68-month period previously conducted. Not applicable at this point since we have not taken over the patient's medication management yet. List of other Serum/Urine Drug Screening Test(s):  No results found for: "AMPHSCRSER", "BARBSCRSER", "BENZOSCRSER", "COCAINSCRSER", "COCAINSCRNUR", "PCPSCRSER", "THCSCRSER", "THCU", "CANNABQUANT", "OPIATESCRSER", "OXYSCRSER", "PROPOXSCRSER", "ETH" List of all UDS test(s) done:  Lab Results  Component Value Date   SUMMARY Note 01/19/2022   Last UDS on record: Summary  Date Value Ref Range Status  01/19/2022 Note  Final    Comment:    ====================================================================  ToxAS[employer] Select 13 (MW) ==================================================================== Test                             Result       Flag       Units  Drug Present and Declared for Prescription Verification   7-aminoclonazepam               174          EXPECTED   ng/mg creat    7-aminoclonazepam is an expected metabolite of clonazepam. Source of    clonazepam is a scheduled prescription medication.  Drug Absent but Declared for Prescription Verification   Oxycodone                      Not Detected UNEXPECTED ng/mg creat ==================================================================== Test                      Result    Flag   Units      Ref Range   Creatinine              27               mg/dL      >=20 ==================================================================== Declared Medications:  The flagging and interpretation on this report are based on the  following declared medications.  Unexpected results may arise from  inaccuracies in the declared medications.   **Note: The testing scope of this panel includes these medications:   Clonazepam (Klonopin)  Oxycodone (Percocet)   **Note: The testing scope of this panel does not include the  following reported medications:   Acetaminophen (Percocet)  Cyclobenzaprine (Flexeril)  Duloxetine (Cymbalta)  Galcanezumab (Emgality)  Lansoprazole (Prevacid)  Meclizine (Antivert)  Ondansetron (Zofran)  Simvastatin (Zocor)  Valacyclovir (Valtrex)  Zolpidem (Ambien) ==================================================================== For clinical consultation, please call [phone]. ====================================================================    UDS interpretation: No unexpected findings.          Medication Assessment Form: Not applicable. No opioids. Treatment compliance: Not applicable Risk Assessment Profile: Aberrant behavior: See initial evaluations. None observed or detected today Comorbid factors increasing risk of overdose: See initial evaluation. No additional risks detected today Opioid risk tool (ORT):     03/19/2019    9:53 AM  Opioid Risk   Alcohol 1  Illegal Drugs 0  Rx Drugs 0  Alcohol 0  Illegal Drugs 0  Rx Drugs 0  Age  between 16-45 years  0  History of Preadolescent Sexual Abuse 0  Psychological Disease 2  Depression 1  Opioid Risk Tool Scoring 4  Opioid Risk Interpretation Moderate Risk    ORT Scoring interpretation table:  Score <3 = Low Risk for SUD  Score between 4-7 = Moderate Risk for SUD  Score >8 = High Risk for Opioid Abuse   Risk of substance use disorder (SUD): Low  Risk Mitigation Strategies:  Patient opioid safety counseling: No controlled substances prescribed. Patient-Prescriber Agreement (PPA): No agreement signed.  Controlled substance notification to other providers: None required. No opioid therapy.  Pharmacologic Plan: Non-opioid analgesic therapy offered. Interventional alternatives discussed.             Laboratory Chemistry Profile   Renal Lab Results  Component Value Date   BUN 15 01/19/2022   CREATININE 0.88 01/19/2022   BCR 17 01/19/2022   GFR 89.79 06/12/2017   GFRAA >60 10/24/2011   GFRNONAA >60 10/24/2011   SPECGRAV 1.025  05/13/2016   PHUR 6.0 05/13/2016   PROTEINUR Negative 05/13/2016     Electrolytes Lab Results  Component Value Date   NA 142 01/19/2022   K 4.5 01/19/2022   CL 101 01/19/2022   CALCIUM 10.0 01/19/2022   MG 2.2 01/19/2022     Hepatic Lab Results  Component Value Date   AST 20 01/19/2022   ALT 16 06/12/2017   ALBUMIN 4.7 01/19/2022   ALKPHOS 105 01/19/2022     ID Lab Results  Component Value Date   STAPHAUREUS  05/26/2010    NEGATIVE        The Xpert SA Assay (FDA approved for NASAL specimens only), is one component of a comprehensive surveillance program.  It is not intended to diagnose infection nor to guide or monitor treatment.   MRSAPCR NEGATIVE 05/26/2010     Bone Lab Results  Component Value Date   VD25OH 30.48 04/12/2019   25OHVITD1 42 01/19/2022   25OHVITD2 8.0 01/19/2022   25OHVITD3 34 01/19/2022     Endocrine Lab Results  Component Value Date   GLUCOSE 70 01/19/2022   GLUCOSEU Negative  09/11/2011   HGBA1C 6.0 05/10/2010   TSH 0.71 04/10/2018   FREET4 0.74 04/07/2011     Neuropathy Lab Results  Component Value Date   VITAMINB12 416 01/19/2022   HGBA1C 6.0 05/10/2010     CNS No results found for: "COLORCSF", "APPEARCSF", "RBCCOUNTCSF", "WBCCSF", "POLYSCSF", "LYMPHSCSF", "EOSCSF", "PROTEINCSF", "GLUCCSF", "JCVIRUS", "CSFOLI", "IGGCSF", "LABACHR", "ACETBL"   Inflammation (CRP: Acute  ESR: Chronic) Lab Results  Component Value Date   CRP 11 (H) 01/19/2022   ESRSEDRATE 51 (H) 01/19/2022     Rheumatology Lab Results  Component Value Date   RF <10.0 02/09/2022   ANA Negative 02/09/2022     Coagulation Lab Results  Component Value Date   PLT 419.0 (H) 04/10/2018     Cardiovascular Lab Results  Component Value Date   HGB 14.2 04/10/2018   HCT 41.4 04/10/2018     Screening Lab Results  Component Value Date   STAPHAUREUS  05/26/2010    NEGATIVE        The Xpert SA Assay (FDA approved for NASAL specimens only), is one component of a comprehensive surveillance program.  It is not intended to diagnose infection nor to guide or monitor treatment.   MRSAPCR NEGATIVE 05/26/2010     Cancer No results found for: "CEA", "CA125", "LABCA2"   Allergens No results found for: "ALMOND", "APPLE", "ASPARAGUS", "AVOCADO", "BANANA", "BARLEY", "BASIL", "BAYLEAF", "GREENBEAN", "LIMABEAN", "WHITEBEAN", "BEEFIGE", "REDBEET", "BLUEBERRY", "BROCCOLI", "CABBAGE", "MELON", "CARROT", "CASEIN", "CASHEWNUT", "CAULIFLOWER", "CELERY"     Note: Lab results reviewed.  Recent Diagnostic Imaging Review  Cervical Imaging: Cervical MR wo contrast: Results for orders placed in visit on 12/14/11  Cervical DG Bending/F/E views: Results for orders placed during the hospital encounter of 01/19/22 DG Cervical Spine With Flex & Extend  Narrative CLINICAL DATA:  Neck pain  EXAM: CERVICAL SPINE COMPLETE WITH FLEXION AND EXTENSION VIEWS  COMPARISON:  None  Available.  FINDINGS: No acute fracture or malalignment identified. Anterior cervical fusion hardware and intervertebral disc spacer at C3-C4, hardware appears intact. No evidence of instability on flexion or extension. Neural foramina appear grossly patent as visualized. Intervertebral disc spaces are preserved. No prevertebral soft tissue swelling visualized.  IMPRESSION: No acute fracture or malalignment.  Surgical changes at C3-C4.   Electronically Signed By: Ofilia Neas M.D. On: 01/19/2022 15:53  Shoulder Imaging: Shoulder-R DG: Results for orders placed during the hospital  encounter of 01/19/22 DG Shoulder Right  Narrative CLINICAL DATA:  Right shoulder pain  EXAM: RIGHT SHOULDER - 2+ VIEW  COMPARISON:  None Available.  FINDINGS: No acute fracture or dislocation identified. Moderate narrowing of the glenohumeral joint with early inferior marginal osteophytes. Likely a subchondral cyst in the mid glenoid. Soft tissues are grossly unremarkable.  IMPRESSION: Degenerative changes of the shoulder as described.   Electronically Signed By: Ofilia Neas M.D. On: 01/19/2022 15:42  Shoulder-L DG: Results for orders placed during the hospital encounter of 01/19/22 DG Shoulder Left  Narrative CLINICAL DATA:  Left shoulder pain  EXAM: LEFT SHOULDER - 2+ VIEW  COMPARISON:  None Available.  FINDINGS: No acute fracture or dislocation identified. Mild narrowing of the glenohumeral joint. No soft tissue abnormality visualized.  IMPRESSION: No acute osseous abnormality identified.   Electronically Signed By: Ofilia Neas M.D. On: 01/19/2022 15:49  Thoracic Imaging: Thoracic DG w/swimmers view: Results for orders placed during the hospital encounter of 01/19/22 [facility] Thoracic Spine W/Swimmers  Narrative CLINICAL DATA:  Back pain  EXAM: THORACIC SPINE - 3 VIEWS  COMPARISON:  None Available.  FINDINGS: There is no evidence of thoracic spine  fracture. Alignment is normal. Mild intervertebral disc space narrowing throughout the thoracic spine. No other significant bone abnormalities are identified.  IMPRESSION: No acute fracture or malalignment.   Electronically Signed By: Ofilia Neas M.D. On: 01/19/2022 15:41  Lumbosacral Imaging: Lumbar DG Bending views: Results for orders placed during the hospital encounter of 01/19/22 DG Lumbar Spine Complete W/Bend  Narrative CLINICAL DATA:  Low back pain  EXAM: LUMBAR SPINE - COMPLETE WITH BENDING VIEWS  COMPARISON:  None Available.  FINDINGS: No acute fracture or malalignment identified. Mild intervertebral disc space narrowing and facet arthropathy at L5-S1.  IMPRESSION: Mild degenerative changes in the lower lumbar spine.   Electronically Signed By: Ofilia Neas M.D. On: 01/19/2022 15:44   Knee Imaging: Knee-R DG 4 views: Results for orders placed during the hospital encounter of 01/19/22 DG Knee Complete 4 Views Right  Narrative CLINICAL DATA:  Chronic right knee pain  EXAM: RIGHT KNEE - COMPLETE 4+ VIEW  COMPARISON:  None Available.  FINDINGS: No evidence of fracture, dislocation, or joint effusion. No evidence of arthropathy or other focal bone abnormality. Soft tissues are unremarkable.  IMPRESSION: Negative.   Electronically Signed By: Ofilia Neas M.D. On: 01/19/2022 15:40  Knee-L DG 4 views: Results for orders placed during the hospital encounter of 01/19/22 DG Knee Complete 4 Views Left  Narrative CLINICAL DATA:  Left knee pain  EXAM: LEFT KNEE - COMPLETE 4+ VIEW  COMPARISON:  None Available.  FINDINGS: No evidence of fracture, dislocation, or joint effusion. No evidence of arthropathy or other focal bone abnormality. Soft tissues are unremarkable.  IMPRESSION: Negative.   Electronically Signed By: Ofilia Neas M.D. On: 01/19/2022 15:52  Foot Imaging: Foot-L DG Complete: Results for orders placed  during the hospital encounter of 07/24/17 DG Foot Complete Left  Narrative CLINICAL DATA:  Spontaneous lateral foot pain.  EXAM: LEFT FOOT - COMPLETE 3+ VIEW  COMPARISON:  12/22/2015  FINDINGS: Plantar calcaneal spur with a vertical linear lucency concerning for nondisplaced fracture through the enthesophyte which is new compared with 12/22/2015. No other acute fracture or dislocation. No aggressive osseous lesion.  IMPRESSION: 1. Plantar calcaneal spur with a vertical linear lucency concerning for nondisplaced fracture through the enthesophyte which is new compared with 12/22/2015.   Electronically Signed By: Kathreen Devoid On: 07/24/2017 13:59  Complexity Note: Imaging results reviewed. Results shared with [name], using Layman's terms.                         Meds   Current Outpatient Medications:    clonazePAM (KLONOPIN) 0.5 MG tablet, Take 1 tablet (0.5 mg total) by mouth every evening., Disp: 30 tablet, Rfl: 5   cyclobenzaprine (FLEXERIL) 10 MG tablet, TAKE 1 TABLET BY MOUTH AT BEDTIME AS NEEDED MUSCLE SPASMS, Disp: 30 tablet, Rfl: 4   DULoxetine (CYMBALTA) 30 MG capsule, Take 1 capsule (30 mg total) by mouth daily., Disp: 90 capsule, Rfl: 3   EMGALITY 120 MG/ML SOAJ, SMARTSIG:1 Milliliter(s) SUB-Q Every 4 Weeks, Disp: , Rfl:    lansoprazole (PREVACID) 30 MG capsule, TAKE 1 CAPSULE BY MOUTH EVERY DAY, Disp: 90 capsule, Rfl: 3   meclizine (ANTIVERT) 25 MG tablet, meclizine 25 mg tablet, Disp: , Rfl:    ondansetron (ZOFRAN) 4 MG tablet, Take 1 tablet (4 mg total) by mouth every 6 (six) hours., Disp: 12 tablet, Rfl: 0   ondansetron (ZOFRAN-ODT) 8 MG disintegrating tablet, Take by mouth., Disp: , Rfl:    oxyCODONE-acetaminophen (PERCOCET) 5-325 MG tablet, Take 1 tablet by mouth daily as needed for severe pain., Disp: 30 tablet, Rfl: 0   simvastatin (ZOCOR) 20 MG tablet, SMARTSIG:1 Tablet(s) By Mouth Every Evening, Disp: , Rfl:    valACYclovir (VALTREX) 500 MG tablet,  Take 1 tablet (500 mg total) by mouth 2 (two) times daily as needed., Disp: 180 tablet, Rfl: 1   zolpidem (AMBIEN CR) 12.5 MG CR tablet, Take 1 tablet (12.5 mg total) by mouth at bedtime., Disp: 30 tablet, Rfl: 5  ROS  Constitutional: Denies any fever or chills Gastrointestinal: No reported hemesis, hematochezia, vomiting, or acute GI distress Musculoskeletal: Denies any acute onset joint swelling, redness, loss of ROM, or weakness Neurological: No reported episodes of acute onset apraxia, aphasia, dysarthria, agnosia, amnesia, paralysis, loss of coordination, or loss of consciousness  Allergies  [name] is allergic to codeine, doxycycline, and neosporin [neomycin-bacitracin zn-polymyx].  PFSH  Drug: [name]  reports no history of drug use. Alcohol:  reports no history of alcohol use. Tobacco:  reports that she has never smoked. She has never used smokeless tobacco. Medical:  has a past medical history of Anemia, Bulging disc, Bulimia nervosa, Fibromyalgia, Genital herpes, GERD (gastroesophageal reflux disease), Hyperlipidemia, and PONV (postoperative nausea and vomiting). Surgical: [name]  has a past surgical history that includes Cesarean section; Abdominal hysterectomy; Rotator cuff repair; Parotidectomy (08/16/2007); Neck surgery; and wsidom tooth. Family: family history includes Breast cancer (age of onset: 75) in her maternal aunt; Lung cancer in her maternal uncle and mother; Melanoma in her sister; Prostate cancer in her paternal uncle; Stomach cancer in her paternal uncle; Stroke in her father.  Constitutional Exam  General appearance: Well nourished, well developed, and well hydrated. In no apparent acute distress Vitals:   02/09/22 1445  BP: 121/81  Pulse: 84  Temp: 98.1 F (36.7 C)  SpO2: 97%  Weight: 196 lb (88.9 kg)  Height: $Remove'5\' 5"'XYXASNb$  (1.651 m)   BMI Assessment: Estimated body mass index is 32.62 kg/m as calculated from the following:   Height as of this  encounter: $RemoveBef'5\' 5"'wlLaPfnjqu$  (1.651 m).   Weight as of this encounter: 196 lb (88.9 kg).  BMI interpretation table: BMI level Category Range association with higher incidence of chronic pain  <18 kg/m2 Underweight   18.5-24.9 kg/m2 Ideal body weight  25-29.9 kg/m2 Overweight Increased incidence by 20%  30-34.9 kg/m2 Obese (Class I) Increased incidence by 68%  35-39.9 kg/m2 Severe obesity (Class II) Increased incidence by 136%  >40 kg/m2 Extreme obesity (Class III) Increased incidence by 254%   Patient's current BMI Ideal Body weight  Body mass index is 32.62 kg/m. Ideal body weight: 57 kg (125 lb 10.6 oz) Adjusted ideal body weight: 69.8 kg (153 lb 12.8 oz)   BMI Readings from Last 4 Encounters:  02/09/22 32.62 kg/m  01/19/22 32.62 kg/m  09/29/21 32.96 kg/m  09/16/21 31.95 kg/m   Wt Readings from Last 4 Encounters:  02/09/22 196 lb (88.9 kg)  01/19/22 196 lb (88.9 kg)  09/29/21 192 lb (87.1 kg)  09/16/21 192 lb (87.1 kg)    Psych/Mental status: Alert, oriented x 3 (person, place, & time)       Eyes: PERLA Respiratory: No evidence of acute respiratory distress  Assessment & Plan  Primary Diagnosis & Pertinent Problem List: The primary encounter diagnosis was Chronic pain syndrome. Diagnoses of Chronic upper back pain (1ry area of Pain) (Bilateral) (R>L), Chronic neck pain (2ry area of Pain) (posterior) (Bilateral) (R>L), Chronic low back pain (3ry area of Pain) (Bilateral) w/o sciatica, Chronic chest wall pain (rib pain) (4th area of Pain) (Bilateral) (R>L), Chronic thigh pain (5th area of Pain) (Bilateral) (L>R), Chronic knee pain (6th area of Pain) (Bilateral) (L>R), Chronic prescription benzodiazepine use, History of cervical spinal surgery (ACDF C3-4), Osteoarthritis of shoulders (Bilateral), DDD (degenerative disc disease), thoracic, DDD (degenerative disc disease), cervical, DDD (degenerative disc disease), lumbosacral, Lumbosacral facet arthropathy (L5-S1), Lumbosacral facet  syndrome, Plantar calcaneal spur of foot (Left), Polyarthralgia, Fibromyalgia, Elevated sed rate, Elevated C-reactive protein (CRP), Chronic neck pain with history of cervical spinal surgery, Chronic shoulder pain (Bilateral) (R>L), Abnormal MRI, cervical spine (12/26/2009), Cervicalgia, and Anxiety due to invasive procedure were also pertinent to this visit.  Visit Diagnosis: 1. Chronic pain syndrome   2. Chronic upper back pain (1ry area of Pain) (Bilateral) (R>L)   3. Chronic neck pain (2ry area of Pain) (posterior) (Bilateral) (R>L)   4. Chronic low back pain (3ry area of Pain) (Bilateral) w/o sciatica   5. Chronic chest wall pain (rib pain) (4th area of Pain) (Bilateral) (R>L)   6. Chronic thigh pain (5th area of Pain) (Bilateral) (L>R)   7. Chronic knee pain (6th area of Pain) (Bilateral) (L>R)   8. Chronic prescription benzodiazepine use   9. History of cervical spinal surgery (ACDF C3-4)   10. Osteoarthritis of shoulders (Bilateral)   11. DDD (degenerative disc disease), thoracic   12. DDD (degenerative disc disease), cervical   13. DDD (degenerative disc disease), lumbosacral   14. Lumbosacral facet arthropathy (L5-S1)   15. Lumbosacral facet syndrome   16. Plantar calcaneal spur of foot (Left)   17. Polyarthralgia   18. Fibromyalgia   19. Elevated sed rate   20. Elevated C-reactive protein (CRP)   21. Chronic neck pain with history of cervical spinal surgery   22. Chronic shoulder pain (Bilateral) (R>L)   23. Abnormal MRI, cervical spine (12/26/2009)   24. Cervicalgia   25. Anxiety due to invasive procedure    Problems updated and reviewed during this visit: No problems updated.   Plan of Care  Pharmacotherapy (Medications Ordered): No orders of the defined types were placed in this encounter.  Procedure Orders         Cervical Epidural Injection     Lab Orders  Rheumatoid factor         ANA w/Reflex if Positive     Imaging Orders         MR CERVICAL  SPINE WO CONTRAST     Referral Orders  No referral(s) requested today    Pharmacological management options:  Opioid Analgesics: I will not be prescribing any opioids at this time Membrane stabilizer: I will not be prescribing any at this time Muscle relaxant: I will not be prescribing any at this time NSAID: I will not be prescribing any at this time Other analgesic(s): I will not be prescribing any at this time     Interventional Therapies  Risk  Complexity Considerations:   Estimated body mass index is 32.62 kg/m as calculated from the following:   Height as of this encounter: $RemoveBeforeD'5\' 5"'eAGAkInAXVTyac$  (1.651 m).   Weight as of this encounter: 196 lb (88.9 kg). WNL   Planned  Pending:   Pending further evaluation   Under consideration:   Diagnostic right cervical ESI #1    Completed:   None at this time   Completed by other providers:   Diagnostic/therapeutic right T8-9 thoracic ESI x1 (12/04/2009) by Almedia Balls, MD  Diagnostic/therapeutic right T7-8 thoracic ESI x2 (01/15/2010 & 02/26/2010) by Almedia Balls, MD    Therapeutic  Palliative (PRN) options:   None established    Provider-requested follow-up: Return for (ECT) procedure: (R) CESI #1. Recent Visits Date Type Provider Dept  02/09/22 Office Visit Milinda Pointer, MD [facility]  01/19/22 Office Visit Milinda Pointer, MD [facility]  Showing recent visits within past 90 days and meeting all other requirements Future Appointments No visits were found meeting these conditions. Showing future appointments within next 90 days and meeting all other requirements  Primary Care Physician: Harrison Mons, PA Note by: Gaspar Cola, MD Date: 02/09/2022; Time: 3:09 PM

## 2022-02-09 ENCOUNTER — Encounter: Payer: Self-pay | Admitting: Pain Medicine

## 2022-02-09 ENCOUNTER — Ambulatory Visit: Payer: BC Managed Care – PPO | Attending: Pain Medicine | Admitting: Pain Medicine

## 2022-02-09 VITALS — BP 121/81 | HR 84 | Temp 98.1°F | Ht 65.0 in | Wt 196.0 lb

## 2022-02-09 DIAGNOSIS — M25512 Pain in left shoulder: Secondary | ICD-10-CM | POA: Insufficient documentation

## 2022-02-09 DIAGNOSIS — M5134 Other intervertebral disc degeneration, thoracic region: Secondary | ICD-10-CM | POA: Diagnosis present

## 2022-02-09 DIAGNOSIS — M25511 Pain in right shoulder: Secondary | ICD-10-CM | POA: Diagnosis present

## 2022-02-09 DIAGNOSIS — G8928 Other chronic postprocedural pain: Secondary | ICD-10-CM | POA: Diagnosis present

## 2022-02-09 DIAGNOSIS — M19011 Primary osteoarthritis, right shoulder: Secondary | ICD-10-CM | POA: Insufficient documentation

## 2022-02-09 DIAGNOSIS — M7732 Calcaneal spur, left foot: Secondary | ICD-10-CM | POA: Diagnosis present

## 2022-02-09 DIAGNOSIS — M25562 Pain in left knee: Secondary | ICD-10-CM | POA: Diagnosis present

## 2022-02-09 DIAGNOSIS — M79651 Pain in right thigh: Secondary | ICD-10-CM | POA: Insufficient documentation

## 2022-02-09 DIAGNOSIS — M19012 Primary osteoarthritis, left shoulder: Secondary | ICD-10-CM | POA: Diagnosis present

## 2022-02-09 DIAGNOSIS — F419 Anxiety disorder, unspecified: Secondary | ICD-10-CM | POA: Diagnosis present

## 2022-02-09 DIAGNOSIS — R7982 Elevated C-reactive protein (CRP): Secondary | ICD-10-CM | POA: Diagnosis present

## 2022-02-09 DIAGNOSIS — M79652 Pain in left thigh: Secondary | ICD-10-CM | POA: Insufficient documentation

## 2022-02-09 DIAGNOSIS — G8929 Other chronic pain: Secondary | ICD-10-CM | POA: Diagnosis present

## 2022-02-09 DIAGNOSIS — R700 Elevated erythrocyte sedimentation rate: Secondary | ICD-10-CM | POA: Diagnosis present

## 2022-02-09 DIAGNOSIS — G894 Chronic pain syndrome: Secondary | ICD-10-CM | POA: Insufficient documentation

## 2022-02-09 DIAGNOSIS — Z79899 Other long term (current) drug therapy: Secondary | ICD-10-CM | POA: Insufficient documentation

## 2022-02-09 DIAGNOSIS — M542 Cervicalgia: Secondary | ICD-10-CM | POA: Diagnosis not present

## 2022-02-09 DIAGNOSIS — R0789 Other chest pain: Secondary | ICD-10-CM | POA: Insufficient documentation

## 2022-02-09 DIAGNOSIS — M5137 Other intervertebral disc degeneration, lumbosacral region: Secondary | ICD-10-CM | POA: Diagnosis present

## 2022-02-09 DIAGNOSIS — M545 Low back pain, unspecified: Secondary | ICD-10-CM | POA: Insufficient documentation

## 2022-02-09 DIAGNOSIS — M797 Fibromyalgia: Secondary | ICD-10-CM | POA: Diagnosis present

## 2022-02-09 DIAGNOSIS — R937 Abnormal findings on diagnostic imaging of other parts of musculoskeletal system: Secondary | ICD-10-CM | POA: Insufficient documentation

## 2022-02-09 DIAGNOSIS — M47817 Spondylosis without myelopathy or radiculopathy, lumbosacral region: Secondary | ICD-10-CM | POA: Insufficient documentation

## 2022-02-09 DIAGNOSIS — M549 Dorsalgia, unspecified: Secondary | ICD-10-CM | POA: Diagnosis not present

## 2022-02-09 DIAGNOSIS — M5030 Other cervical disc degeneration, unspecified cervical region: Secondary | ICD-10-CM | POA: Diagnosis present

## 2022-02-09 DIAGNOSIS — M25561 Pain in right knee: Secondary | ICD-10-CM | POA: Diagnosis present

## 2022-02-09 DIAGNOSIS — M2550 Pain in unspecified joint: Secondary | ICD-10-CM | POA: Diagnosis present

## 2022-02-09 DIAGNOSIS — Z9889 Other specified postprocedural states: Secondary | ICD-10-CM | POA: Diagnosis present

## 2022-02-09 NOTE — Patient Instructions (Signed)
______________________________________________________________________  Preparing for Procedure with Sedation  NOTICE: Due to recent regulatory changes, starting on March 15, 2021, procedures requiring intravenous (IV) sedation will no longer be performed at the [facility].  These types of procedures are required to be performed at [facility] ambulatory surgery facility.  We are very sorry for the inconvenience.  Procedure appointments are limited to planned procedures: No Prescription Refills. No disability issues will be discussed. No medication changes will be discussed.  Instructions: Oral Intake: Do not eat or drink anything for at least 8 hours prior to your procedure. (Exception: Blood Pressure Medication. See below.) Transportation: A driver is required. You may not drive yourself after the procedure. Blood Pressure Medicine: Do not forget to take your blood pressure medicine with a sip of water the morning of the procedure. If your Diastolic (lower reading) is above 100 mmHg, elective cases will be cancelled/rescheduled. Blood thinners: These will need to be stopped for procedures. Notify our staff if you are taking any blood thinners. Depending on which one you take, there will be specific instructions on how and when to stop it. Diabetics on insulin: Notify the staff so that you can be scheduled 1st case in the morning. If your diabetes requires high dose insulin, take only  of your normal insulin dose the morning of the procedure and notify the staff that you have done so. Preventing infections: Shower with an antibacterial soap the morning of your procedure. Build-up your immune system: Take 1000 mg of Vitamin C with every meal (3 times a day) the day prior to your procedure. Antibiotics: Inform the staff if you have a condition or reason that requires you to take antibiotics before dental procedures. Pregnancy: If you are pregnant, call and cancel the procedure. Sickness: If  you have a cold, fever, or any active infections, call and cancel the procedure. Arrival: You must be in the facility at least 30 minutes prior to your scheduled procedure. Children: Do not bring children with you. Dress appropriately: There is always the possibility that your clothing may get soiled. Valuables: Do not bring any jewelry or valuables.  Reasons to call and reschedule or cancel your procedure: (Following these recommendations will minimize the risk of a serious complication.) Surgeries: Avoid having procedures within 2 weeks of any surgery. (Avoid for 2 weeks before or after any surgery). Flu Shots: Avoid having procedures within 2 weeks of a flu shots. (Avoid for 2 weeks before or after immunizations). Barium: Avoid having a procedure within 7-10 days after having had a radiological study involving the use of radiological contrast. (Myelograms, Barium swallow or enema study). Heart attacks: Avoid any elective procedures or surgeries for the initial 6 months after a "Myocardial Infarction" (Heart Attack). Blood thinners: It is imperative that you stop these medications before procedures. Let us know if you if you take any blood thinner.  Infection: Avoid procedures during or within two weeks of an infection (including chest colds or gastrointestinal problems). Symptoms associated with infections include: Localized redness, fever, chills, night sweats or profuse sweating, burning sensation when voiding, cough, congestion, stuffiness, runny nose, sore throat, diarrhea, nausea, vomiting, cold or Flu symptoms, recent or current infections. It is specially important if the infection is over the area that we intend to treat. Heart and lung problems: Symptoms that may suggest an active cardiopulmonary problem include: cough, chest pain, breathing difficulties or shortness of breath, dizziness, ankle swelling, uncontrolled high or unusually low blood pressure, and/or palpitations. If you are    experiencing any of these symptoms, cancel your procedure and contact your primary care physician for an evaluation.  Remember:  Regular Business hours are:  Monday to Thursday 8:00 AM to 4:00 PM  Provider's Schedule: Aurora Rody, MD:  Procedure days: Tuesday and Thursday 7:30 AM to 4:00 PM  Bilal Lateef, MD:  Procedure days: Monday and Wednesday 7:30 AM to 4:00 PM ______________________________________________________________________  ____________________________________________________________________________________________  General Risks and Possible Complications  Patient Responsibilities: It is important that you read this as it is part of your informed consent. It is our duty to inform you of the risks and possible complications associated with treatments offered to you. It is your responsibility as a patient to read this and to ask questions about anything that is not clear or that you believe was not covered in this document.  Patient's Rights: You have the right to refuse treatment. You also have the right to change your mind, even after initially having agreed to have the treatment done. However, under this last option, if you wait until the last second to change your mind, you may be charged for the materials used up to that point.  Introduction: Medicine is not an exact science. Everything in Medicine, including the lack of treatment(s), carries the potential for danger, harm, or loss (which is by definition: Risk). In Medicine, a complication is a secondary problem, condition, or disease that can aggravate an already existing one. All treatments carry the risk of possible complications. The fact that a side effects or complications occurs, does not imply that the treatment was conducted incorrectly. It must be clearly understood that these can happen even when everything is done following the highest safety standards.  No treatment: You can choose not to proceed with the  proposed treatment alternative. The "PRO(s)" would include: avoiding the risk of complications associated with the therapy. The "CON(s)" would include: not getting any of the treatment benefits. These benefits fall under one of three categories: diagnostic; therapeutic; and/or palliative. Diagnostic benefits include: getting information which can ultimately lead to improvement of the disease or symptom(s). Therapeutic benefits are those associated with the successful treatment of the disease. Finally, palliative benefits are those related to the decrease of the primary symptoms, without necessarily curing the condition (example: decreasing the pain from a flare-up of a chronic condition, such as incurable terminal cancer).  General Risks and Complications: These are associated to most interventional treatments. They can occur alone, or in combination. They fall under one of the following six (6) categories: no benefit or worsening of symptoms; bleeding; infection; nerve damage; allergic reactions; and/or death. No benefits or worsening of symptoms: In Medicine there are no guarantees, only probabilities. No healthcare provider can ever guarantee that a medical treatment will work, they can only state the probability that it may. Furthermore, there is always the possibility that the condition may worsen, either directly, or indirectly, as a consequence of the treatment. Bleeding: This is more common if the patient is taking a blood thinner, either prescription or over the counter (example: Goody Powders, Fish oil, Aspirin, Garlic, etc.), or if suffering a condition associated with impaired coagulation (example: Hemophilia, cirrhosis of the liver, low platelet counts, etc.). However, even if you do not have one on these, it can still happen. If you have any of these conditions, or take one of these drugs, make sure to notify your treating physician. Infection: This is more common in patients with a compromised  immune system, either due to disease (example:   diabetes, cancer, human immunodeficiency virus [HIV], etc.), or due to medications or treatments (example: therapies used to treat cancer and rheumatological diseases). However, even if you do not have one on these, it can still happen. If you have any of these conditions, or take one of these drugs, make sure to notify your treating physician. Nerve Damage: This is more common when the treatment is an invasive one, but it can also happen with the use of medications, such as those used in the treatment of cancer. The damage can occur to small secondary nerves, or to large primary ones, such as those in the spinal cord and brain. This damage may be temporary or permanent and it may lead to impairments that can range from temporary numbness to permanent paralysis and/or brain death. Allergic Reactions: Any time a substance or material comes in contact with our body, there is the possibility of an allergic reaction. These can range from a mild skin rash (contact dermatitis) to a severe systemic reaction (anaphylactic reaction), which can result in death. Death: In general, any medical intervention can result in death, most of the time due to an unforeseen complication. ____________________________________________________________________________________________  

## 2022-02-09 NOTE — Progress Notes (Signed)
Safety precautions to be maintained throughout the outpatient stay will include: orient to surroundings, keep bed in low position, maintain call bell within reach at all times, provide assistance with transfer out of bed and ambulation.  

## 2022-02-10 LAB — ANA W/REFLEX IF POSITIVE: Anti Nuclear Antibody (ANA): NEGATIVE

## 2022-02-10 LAB — RHEUMATOID FACTOR: Rheumatoid fact SerPl-aCnc: <10 IU/mL (ref <14.0)

## 2022-03-01 ENCOUNTER — Telehealth: Payer: Self-pay

## 2022-03-01 NOTE — Telephone Encounter (Signed)
Insurance Treatment Denial Note  Date order was entered:  Order entered by: Delano Metz, MD Requested treatment: Cervical MRI Reason for denial: Documentation of Physical Therapy is missing. Recommended for approval:  PT   Patient is willing to do PT if you will put in order

## 2022-03-02 ENCOUNTER — Other Ambulatory Visit: Payer: Self-pay | Admitting: Pain Medicine

## 2022-03-02 DIAGNOSIS — M47812 Spondylosis without myelopathy or radiculopathy, cervical region: Secondary | ICD-10-CM

## 2022-03-02 DIAGNOSIS — R937 Abnormal findings on diagnostic imaging of other parts of musculoskeletal system: Secondary | ICD-10-CM

## 2022-03-02 DIAGNOSIS — M5030 Other cervical disc degeneration, unspecified cervical region: Secondary | ICD-10-CM

## 2022-03-02 DIAGNOSIS — M5412 Radiculopathy, cervical region: Secondary | ICD-10-CM

## 2022-03-02 DIAGNOSIS — M542 Cervicalgia: Secondary | ICD-10-CM

## 2022-03-02 NOTE — Progress Notes (Signed)
Insurance company indicates that physical therapy is required before approving cervical MRI.

## 2022-03-15 ENCOUNTER — Ambulatory Visit: Payer: BC Managed Care – PPO | Admitting: Pain Medicine

## 2022-03-29 ENCOUNTER — Ambulatory Visit: Payer: BC Managed Care – PPO | Admitting: Pain Medicine

## 2022-08-22 ENCOUNTER — Ambulatory Visit
Admission: EM | Admit: 2022-08-22 | Discharge: 2022-08-22 | Disposition: A | Discharge status: Home / Self Care | Payer: 59 | Attending: Emergency Medicine | Admitting: Emergency Medicine

## 2022-08-22 DIAGNOSIS — M797 Fibromyalgia: Secondary | ICD-10-CM | POA: Diagnosis not present

## 2022-08-22 DIAGNOSIS — M5010 Cervical disc disorder with radiculopathy, unspecified cervical region: Secondary | ICD-10-CM | POA: Diagnosis not present

## 2022-08-22 DIAGNOSIS — Z79899 Other long term (current) drug therapy: Secondary | ICD-10-CM | POA: Insufficient documentation

## 2022-08-22 DIAGNOSIS — F1120 Opioid dependence, uncomplicated: Secondary | ICD-10-CM | POA: Insufficient documentation

## 2022-08-22 DIAGNOSIS — J029 Acute pharyngitis, unspecified: Secondary | ICD-10-CM

## 2022-08-22 DIAGNOSIS — M4722 Other spondylosis with radiculopathy, cervical region: Secondary | ICD-10-CM | POA: Insufficient documentation

## 2022-08-22 DIAGNOSIS — Z1152 Encounter for screening for COVID-19: Secondary | ICD-10-CM | POA: Insufficient documentation

## 2022-08-22 DIAGNOSIS — G894 Chronic pain syndrome: Secondary | ICD-10-CM | POA: Insufficient documentation

## 2022-08-22 DIAGNOSIS — E785 Hyperlipidemia, unspecified: Secondary | ICD-10-CM | POA: Diagnosis not present

## 2022-08-22 DIAGNOSIS — G4700 Insomnia, unspecified: Secondary | ICD-10-CM | POA: Insufficient documentation

## 2022-08-22 DIAGNOSIS — B9789 Other viral agents as the cause of diseases classified elsewhere: Secondary | ICD-10-CM | POA: Diagnosis not present

## 2022-08-22 DIAGNOSIS — G2581 Restless legs syndrome: Secondary | ICD-10-CM | POA: Diagnosis not present

## 2022-08-22 DIAGNOSIS — J028 Acute pharyngitis due to other specified organisms: Secondary | ICD-10-CM | POA: Diagnosis not present

## 2022-08-22 DIAGNOSIS — M5137 Other intervertebral disc degeneration, lumbosacral region: Secondary | ICD-10-CM | POA: Insufficient documentation

## 2022-08-22 DIAGNOSIS — F502 Bulimia nervosa: Secondary | ICD-10-CM | POA: Insufficient documentation

## 2022-08-22 DIAGNOSIS — K219 Gastro-esophageal reflux disease without esophagitis: Secondary | ICD-10-CM | POA: Diagnosis not present

## 2022-08-22 LAB — POCT RAPID STREP A (OFFICE): Rapid Strep A Screen: NEGATIVE

## 2022-08-22 NOTE — Discharge Instructions (Addendum)
Your strep test is negative.  Your COVID test is pending.    Take Tylenol as needed for fever or discomfort.  Rest and keep yourself hydrated.    Follow-up with your primary care provider if your symptoms are not improving.     

## 2022-08-22 NOTE — ED Provider Notes (Signed)
[facility]    CSN: [id number] Arrival date & time: 08/22/22  1020      History   Chief Complaint Chief Complaint  Patient presents with   Sore Throat    HPI [name] is a 52 y.o. female.  Patient presents with 4 day history of sore throat.  No fever, rash, cough, shortness of breath, or other symptoms.  No OTC medications taken today; took Dayquil yesterday.  Her medical history includes fibromyalgia, DDD, chronic pain syndrome, chronic benzodiazepine use, opioid dependence, insomnia, GERD, bulimia, depression, anxiety, restless leg syndrome.  The history is provided by the patient and medical records.    Past Medical History:  Diagnosis Date   Anemia    with pregnancy   Bulging disc    T7/8-receiving ESI's (Dr. Charlett Blake)   Bulimia nervosa    history   Fibromyalgia    Dr. Corliss Skains   Genital herpes    GERD (gastroesophageal reflux disease)    Hyperlipidemia    PONV (postoperative nausea and vomiting)    and has the shakes after sedation as well    Patient Active Problem List   Diagnosis Date Noted   Elevated C-reactive protein (CRP) 02/09/2022   Osteoarthritis of shoulder (Right) 02/09/2022   Osteoarthritis of shoulder (Left) 02/09/2022   Osteoarthritis of shoulders (Bilateral) 02/09/2022   DDD (degenerative disc disease), thoracic 02/09/2022   DDD (degenerative disc disease), cervical 02/09/2022   DDD (degenerative disc disease), lumbosacral 02/09/2022   Lumbosacral facet arthropathy (L5-S1) 02/09/2022   Lumbosacral facet syndrome 02/09/2022   Plantar calcaneal spur of foot (Left) 02/09/2022   Chronic pain syndrome 01/19/2022   Pharmacologic therapy 01/19/2022   Disorder of skeletal system 01/19/2022   Problems influencing health status 01/19/2022   Abnormal MRI, cervical spine (12/26/2009) 01/19/2022   Cervical facet arthropathy 01/19/2022   Grade 1 Anterolisthesis of cervical spine (1 mm) (C3/C4) 01/19/2022   Thoracic spondylosis without  myelopathy 01/19/2022   Chronic shoulder pain (Bilateral) (R>L) 01/19/2022   Chronic upper extremity pain (Bilateral) (R>L) 01/19/2022   Cervicalgia 01/19/2022   Chronic low back pain (3ry area of Pain) (Bilateral) w/o sciatica 01/19/2022   Chronic upper back pain (1ry area of Pain) (Bilateral) (R>L) 01/19/2022   Chronic thoracic back pain (Bilateral) (R>L) 01/19/2022   Chronic neck pain (2ry area of Pain) (posterior) (Bilateral) (R>L) 01/19/2022   Chronic thigh pain (5th area of Pain) (Bilateral) (L>R) 01/19/2022   Chronic knee pain (6th area of Pain) (Bilateral) (L>R) 01/19/2022   Chronic generalized pain 01/19/2022   Pain in pelvis 12/31/2021   BMI 33.0-33.9,adult 06/24/2021   Vestibular migraine 06/24/2021   Vitamin B 12 deficiency 12/02/2019   Restless legs syndrome (RLS) 11/01/2019   Chronic prescription benzodiazepine use 10/23/2019   Thrombocytosis 04/17/2019   Pain syndrome, chronic 04/17/2019   Chronic chest wall pain (rib pain) (4th area of Pain) (Bilateral) (R>L) 03/19/2019   Elevated sed rate 03/19/2019   Polyarthralgia 03/19/2019   History of cervical spinal surgery (ACDF C3-4) 03/19/2019   Costochondritis 06/23/2018   Referred otalgia of right ear 05/08/2018   Arthralgia of right temporomandibular joint 05/08/2018   Depression with anxiety 04/10/2018   Fatigue 04/10/2018   Opioid type dependence, continuous (HCC) 11/30/2017   Chronic prescription opiate use 11/30/2017   Arthritis 07/15/2015   Hot flashes 07/15/2015   Shortness of breath 08/20/2013   Chronic neck pain with history of cervical spinal surgery 11/24/2011   Cervical radicular pain 11/24/2011   Fibromyalgia 04/07/2011  HLD (hyperlipidemia) 05/05/2010   STREP THROAT 02/09/2010   GENITAL HERPES 01/22/2010   Persistent insomnia 01/22/2010   GERD 01/22/2010   BULIMIA, HX OF 01/22/2010   History of bulimia nervosa 01/22/2010   Genital herpes 01/22/2010    Past Surgical History:  Procedure  Laterality Date   ABDOMINAL HYSTERECTOMY     endometriosis   CESAREAN SECTION     x1   NECK SURGERY     PAROTIDECTOMY  08/16/2007   benign tumor   ROTATOR CUFF REPAIR     wsidom tooth     x 1- age 53    OB History   No obstetric history on file.      Home Medications    Prior to Admission medications   Medication Sig Start Date End Date Taking? Authorizing Provider  DULoxetine (CYMBALTA) 30 MG capsule Take 1 tablet by mouth daily. 06/20/22  Yes [provider]  lansoprazole (PREVACID) 30 MG capsule Take 1 tablet by mouth daily. 02/14/22  Yes [provider]  meclizine (ANTIVERT) 25 MG tablet Take by mouth. PRN 04/01/22  Yes [provider]  simvastatin (ZOCOR) 20 MG tablet Take by mouth. 02/14/22  Yes [provider]  clonazePAM (KLONOPIN) 0.5 MG tablet Take 1 tablet (0.5 mg total) by mouth every evening. 09/23/19   Dianne Dun, MD  cyclobenzaprine (FLEXERIL) 10 MG tablet TAKE 1 TABLET BY MOUTH AT BEDTIME AS NEEDED MUSCLE SPASMS 10/23/19   Gweneth Dimitri, MD  DULoxetine (CYMBALTA) 30 MG capsule Take 1 capsule (30 mg total) by mouth daily. 02/26/19   Dianne Dun, MD  EMGALITY 120 MG/ML SOAJ SMARTSIG:1 Milliliter(s) SUB-Q Every 4 Weeks 08/20/21   [provider]  lansoprazole (PREVACID) 30 MG capsule TAKE 1 CAPSULE BY MOUTH EVERY DAY 09/18/19   Dianne Dun, MD  meclizine (ANTIVERT) 25 MG tablet meclizine 25 mg tablet    [provider]  ondansetron (ZOFRAN) 4 MG tablet Take 1 tablet (4 mg total) by mouth every 6 (six) hours. 10/31/21   Bing Neighbors, FNP  ondansetron (ZOFRAN-ODT) 8 MG disintegrating tablet Take by mouth. 06/02/20   [provider]  oxyCODONE-acetaminophen (PERCOCET) 5-325 MG tablet Take 1 tablet by mouth daily as needed for severe pain. 09/26/19   Dianne Dun, MD  simvastatin (ZOCOR) 20 MG tablet SMARTSIG:1 Tablet(s) By Mouth Every Evening 08/22/21   [provider]  valACYclovir (VALTREX) 500 MG  tablet Take 1 tablet (500 mg total) by mouth 2 (two) times daily as needed. 11/02/17   Dianne Dun, MD  zolpidem (AMBIEN CR) 12.5 MG CR tablet Take 1 tablet (12.5 mg total) by mouth at bedtime. 09/23/19   Dianne Dun, MD    Family History Family History  Problem Relation Age of Onset   Lung cancer Mother    Stroke Father    Melanoma Sister    Breast cancer Maternal Aunt 33   Lung cancer Maternal Uncle    Stomach cancer Paternal Uncle    Prostate cancer Paternal Uncle    Colon cancer Neg Hx    Colon polyps Neg Hx    Esophageal cancer Neg Hx    Rectal cancer Neg Hx     Social History Social History   Tobacco Use   Smoking status: Never   Smokeless tobacco: Never  Vaping Use   Vaping Use: Never used  Substance Use Topics   Alcohol use: No    Comment: occasional   Drug use: No  Allergies   Codeine, Doxycycline, and Neosporin [neomycin-bacitracin zn-polymyx]   Review of Systems Review of Systems  Constitutional:  Negative for chills and fever.  HENT:  Positive for sore throat. Negative for ear pain.   Respiratory:  Negative for cough and shortness of breath.   Cardiovascular:  Negative for chest pain and palpitations.  Gastrointestinal:  Negative for abdominal pain, diarrhea and vomiting.  Skin:  Negative for color change and rash.  All other systems reviewed and are negative.    Physical Exam Triage Vital Signs ED Triage Vitals  Enc Vitals Group     BP      Pulse      Resp      Temp      Temp src      SpO2      Weight      Height      Head Circumference      Peak Flow      Pain Score      Pain Loc      Pain Edu?      Excl. in [place]?    No data found.  Updated Vital Signs BP 125/87   Pulse 87   Temp 98 F (36.7 C)   Resp 18   Ht 5\' 4"  (1.626 m)   Wt 198 lb (89.8 kg)   SpO2 98%   BMI 33.99 kg/m   Visual Acuity Right Eye Distance:   Left Eye Distance:   Bilateral Distance:    Right Eye Near:   Left Eye Near:    Bilateral Near:      Physical Exam Vitals and nursing note reviewed.  Constitutional:      General: She is not in acute distress.    Appearance: She is well-developed. She is not ill-appearing.  HENT:     Right Ear: Tympanic membrane normal.     Left Ear: Tympanic membrane normal.     Nose: Nose normal.     Mouth/Throat:     Mouth: Mucous membranes are moist.     Pharynx: Posterior oropharyngeal erythema present.  Cardiovascular:     Rate and Rhythm: Normal rate and regular rhythm.     Heart sounds: Normal heart sounds.  Pulmonary:     Effort: Pulmonary effort is normal. No respiratory distress.     Breath sounds: Normal breath sounds.  Musculoskeletal:     Cervical back: Neck supple.  Skin:    General: Skin is warm and dry.  Neurological:     Mental Status: She is alert.  Psychiatric:        Mood and Affect: Mood normal.        Behavior: Behavior normal.      UC Treatments / Results  Labs (all labs ordered are listed, but only abnormal results are displayed) Labs Reviewed  POCT RAPID STREP A (OFFICE)    EKG   Radiology No results found.  Procedures Procedures (including critical care time)  Medications Ordered in UC Medications - No data to display  Initial Impression / Assessment and Plan / UC Course  I have reviewed the triage vital signs and the nursing notes.  Pertinent labs & imaging results that were available during my care of the patient were reviewed by me and considered in my medical decision making (see chart for details).    Viral pharyngitis.  Rapid strep negative.  COVID pending.  Discussed symptomatic treatment including Tylenol, rest, hydration.  Instructed patient to follow up with her PCP  if symptoms are not improving.  She agrees to plan of care.   Final Clinical Impressions(s) / UC Diagnoses   Final diagnoses:  Viral pharyngitis     Discharge Instructions      Your strep test is negative.    Your COVID test is pending.    Take Tylenol as  needed for fever or discomfort.  Rest and keep yourself hydrated.    Follow-up with your primary care provider if your symptoms are not improving.         ED Prescriptions   None    PDMP not reviewed this encounter.   Mickie Bail, NP 08/22/22 1049

## 2022-08-22 NOTE — ED Triage Notes (Signed)
Patient to Urgent Care with complaints of sore throat x4 days.  Denies any known fevers.

## 2022-08-23 LAB — SARS CORONAVIRUS 2 (TAT 6-24 HRS): SARS Coronavirus 2: NEGATIVE

## 2023-02-02 ENCOUNTER — Other Ambulatory Visit: Payer: Self-pay | Admitting: Orthopedic Surgery

## 2023-02-02 DIAGNOSIS — M542 Cervicalgia: Secondary | ICD-10-CM

## 2023-02-05 ENCOUNTER — Ambulatory Visit
Admission: RE | Admit: 2023-02-05 | Discharge: 2023-02-05 | Disposition: A | Discharge status: Home / Self Care | Payer: 59 | Source: Ambulatory Visit | Attending: Orthopedic Surgery | Admitting: Orthopedic Surgery

## 2023-02-05 DIAGNOSIS — M542 Cervicalgia: Secondary | ICD-10-CM

## 2023-02-10 ENCOUNTER — Encounter: Payer: Self-pay | Admitting: Orthopedic Surgery

## 2023-02-10 ENCOUNTER — Other Ambulatory Visit: Payer: Self-pay | Admitting: Orthopedic Surgery

## 2023-02-10 DIAGNOSIS — M542 Cervicalgia: Secondary | ICD-10-CM

## 2023-02-17 ENCOUNTER — Ambulatory Visit
Admission: RE | Admit: 2023-02-17 | Discharge: 2023-02-17 | Disposition: A | Discharge status: Home / Self Care | Payer: 59 | Source: Ambulatory Visit | Attending: Orthopedic Surgery | Admitting: Orthopedic Surgery

## 2023-02-17 DIAGNOSIS — M542 Cervicalgia: Secondary | ICD-10-CM

## 2023-05-25 ENCOUNTER — Other Ambulatory Visit: Payer: Self-pay | Admitting: Student

## 2023-05-25 DIAGNOSIS — G43809 Other migraine, not intractable, without status migrainosus: Secondary | ICD-10-CM

## 2023-06-07 ENCOUNTER — Encounter: Payer: Self-pay | Admitting: Student

## 2023-06-12 ENCOUNTER — Ambulatory Visit
Admission: RE | Admit: 2023-06-12 | Discharge: 2023-06-12 | Disposition: A | Discharge status: Home / Self Care | Payer: 59 | Source: Ambulatory Visit | Attending: Student | Admitting: Student

## 2023-06-12 DIAGNOSIS — G43809 Other migraine, not intractable, without status migrainosus: Secondary | ICD-10-CM

## 2023-06-12 MED ORDER — GADOPICLENOL 0.5 MMOL/ML IV SOLN
10.0000 mL | Freq: Once | INTRAVENOUS | Status: AC | PRN
Start: 2023-06-12 — End: 2023-06-12
  Administered 2023-06-12: 8 mL via INTRAVENOUS

## 2023-07-18 ENCOUNTER — Ambulatory Visit: Payer: 59 | Admitting: Cardiology

## 2024-03-04 ENCOUNTER — Ambulatory Visit: Admission: EM | Admit: 2024-03-04 | Discharge: 2024-03-04 | Disposition: A | Discharge status: Home / Self Care

## 2024-03-04 DIAGNOSIS — J029 Acute pharyngitis, unspecified: Secondary | ICD-10-CM

## 2024-03-04 LAB — POCT RAPID STREP A (OFFICE): Rapid Strep A Screen: NEGATIVE

## 2024-03-04 NOTE — ED Provider Notes (Signed)
 [facility]    CSN: [id number] Arrival date & time: 03/04/24  1703      History   Chief Complaint Chief Complaint  Patient presents with   Fever   Sore Throat    HPI [name] is a 53 y.o. female.   Patient complains of a sore throat.  Patient is concerned that she had strep.  Patient has had a history of strep in the past.  Patient denies fever.  She complains of soreness in the glands in her neck.  Patient has not had any cough or congestion.   Fever Sore Throat    Past Medical History:  Diagnosis Date   Anemia    with pregnancy   Bulging disc    T7/8-receiving ESI's (Dr. Gaspar)   Bulimia nervosa    history   Fibromyalgia    Dr. Dolphus   Genital herpes    GERD (gastroesophageal reflux disease)    Hyperlipidemia    PONV (postoperative nausea and vomiting)    and has the shakes after sedation as well    Patient Active Problem List   Diagnosis Date Noted   Acute pharyngitis 08/22/2022   Elevated C-reactive protein (CRP) 02/09/2022   Osteoarthritis of shoulder (Right) 02/09/2022   Osteoarthritis of shoulder (Left) 02/09/2022   Osteoarthritis of shoulders (Bilateral) 02/09/2022   DDD (degenerative disc disease), thoracic 02/09/2022   DDD (degenerative disc disease), cervical 02/09/2022   DDD (degenerative disc disease), lumbosacral 02/09/2022   Lumbosacral facet arthropathy (L5-S1) 02/09/2022   Lumbosacral facet syndrome 02/09/2022   Plantar calcaneal spur of foot (Left) 02/09/2022   Chronic pain syndrome 01/19/2022   Pharmacologic therapy 01/19/2022   Disorder of skeletal system 01/19/2022   Problems influencing health status 01/19/2022   Abnormal MRI, cervical spine (12/26/2009) 01/19/2022   Cervical facet arthropathy 01/19/2022   Grade 1 Anterolisthesis of cervical spine (1 mm) (C3/C4) 01/19/2022   Thoracic spondylosis without myelopathy 01/19/2022   Chronic shoulder pain (Bilateral) (R>L) 01/19/2022   Chronic upper extremity pain  (Bilateral) (R>L) 01/19/2022   Cervicalgia 01/19/2022   Chronic low back pain (3ry area of Pain) (Bilateral) w/o sciatica 01/19/2022   Chronic upper back pain (1ry area of Pain) (Bilateral) (R>L) 01/19/2022   Chronic thoracic back pain (Bilateral) (R>L) 01/19/2022   Chronic neck pain (2ry area of Pain) (posterior) (Bilateral) (R>L) 01/19/2022   Chronic thigh pain (5th area of Pain) (Bilateral) (L>R) 01/19/2022   Chronic knee pain (6th area of Pain) (Bilateral) (L>R) 01/19/2022   Chronic generalized pain 01/19/2022   Pain in pelvis 12/31/2021   BMI 33.0-33.9,adult 06/24/2021   Vestibular migraine 06/24/2021   Vitamin B 12 deficiency 12/02/2019   Restless legs syndrome (RLS) 11/01/2019   Chronic prescription benzodiazepine use 10/23/2019   Thrombocytosis 04/17/2019   Pain syndrome, chronic 04/17/2019   Chronic chest wall pain (rib pain) (4th area of Pain) (Bilateral) (R>L) 03/19/2019   Elevated sed rate 03/19/2019   Polyarthralgia 03/19/2019   History of cervical spinal surgery (ACDF C3-4) 03/19/2019   Costochondritis 06/23/2018   Referred otalgia of right ear 05/08/2018   Arthralgia of right temporomandibular joint 05/08/2018   Depression with anxiety 04/10/2018   Fatigue 04/10/2018   Opioid type dependence, continuous (HCC) 11/30/2017   Chronic prescription opiate use 11/30/2017   Arthritis 07/15/2015   Hot flashes 07/15/2015   Shortness of breath 08/20/2013   Chronic neck pain with history of cervical spinal surgery 11/24/2011   Cervical radicular pain 11/24/2011   Fibromyalgia 04/07/2011  HLD (hyperlipidemia) 05/05/2010   STREP THROAT 02/09/2010   Genital herpes 01/22/2010   Persistent insomnia 01/22/2010   GERD 01/22/2010   BULIMIA, HX OF 01/22/2010   History of bulimia nervosa 01/22/2010   Genital herpes 01/22/2010    Past Surgical History:  Procedure Laterality Date   ABDOMINAL HYSTERECTOMY     endometriosis   CESAREAN SECTION     x1   NECK SURGERY      PAROTIDECTOMY  08/16/2007   benign tumor   ROTATOR CUFF REPAIR     wsidom tooth     x 1- age 57    OB History   No obstetric history on file.      Home Medications    Prior to Admission medications   Medication Sig Start Date End Date Taking? Authorizing Provider  AJOVY 225 MG/1.5ML SOAJ INJECT 225 MG SUBCUTANEOUSLY MONTHLY    [provider]  clonazePAM  (KLONOPIN ) 0.5 MG tablet Take 1 tablet (0.5 mg total) by mouth every evening. 09/23/19   Aron, Talia M, MD  cyclobenzaprine  (FLEXERIL ) 10 MG tablet TAKE 1 TABLET BY MOUTH AT BEDTIME AS NEEDED MUSCLE SPASMS 10/23/19   Velma Raisin, MD  DULoxetine  (CYMBALTA ) 30 MG capsule Take 1 capsule (30 mg total) by mouth daily. 02/26/19   Aron, Talia M, MD  DULoxetine  (CYMBALTA ) 30 MG capsule Take 1 tablet by mouth daily. 06/20/22   [provider]  EMGALITY 120 MG/ML SOAJ SMARTSIG:1 Milliliter(s) SUB-Q Every 4 Weeks Patient not taking: Reported on 03/04/2024 08/20/21   [provider]  lansoprazole  (PREVACID ) 30 MG capsule TAKE 1 CAPSULE BY MOUTH EVERY DAY 09/18/19   Jenetta Holmes HERO, MD  lansoprazole  (PREVACID ) 30 MG capsule Take 1 tablet by mouth daily. 02/14/22   [provider]  meclizine  (ANTIVERT ) 25 MG tablet meclizine  25 mg tablet    [provider]  meclizine  (ANTIVERT ) 25 MG tablet Take by mouth. PRN 04/01/22   [provider]  ondansetron  (ZOFRAN ) 4 MG tablet Take 1 tablet (4 mg total) by mouth every 6 (six) hours. 10/31/21   Arloa Suzen RAMAN, NP  ondansetron  (ZOFRAN -ODT) 8 MG disintegrating tablet Take by mouth. 06/02/20   [provider]  oxyCODONE -acetaminophen  (PERCOCET) 5-325 MG tablet Take 1 tablet by mouth daily as needed for severe pain. 09/26/19   Aron, Talia M, MD  simvastatin  (ZOCOR ) 20 MG tablet SMARTSIG:1 Tablet(s) By Mouth Every Evening 08/22/21   [provider]  simvastatin  (ZOCOR ) 20 MG tablet Take by mouth. 02/14/22   [provider]  valACYclovir  (VALTREX )  500 MG tablet Take 1 tablet (500 mg total) by mouth 2 (two) times daily as needed. 11/02/17   Aron, Talia M, MD  zolpidem  (AMBIEN  CR) 12.5 MG CR tablet Take 1 tablet (12.5 mg total) by mouth at bedtime. 09/23/19   Jenetta Holmes HERO, MD    Family History Family History  Problem Relation Age of Onset   Lung cancer Mother    Stroke Father    Melanoma Sister    Breast cancer Maternal Aunt 96   Lung cancer Maternal Uncle    Stomach cancer Paternal Uncle    Prostate cancer Paternal Uncle    Colon cancer Neg Hx    Colon polyps Neg Hx    Esophageal cancer Neg Hx    Rectal cancer Neg Hx     Social History Social History   Tobacco Use   Smoking status: Never   Smokeless tobacco: Never  Vaping Use   Vaping status: Never  Used  Substance Use Topics   Alcohol use: No    Comment: occasional   Drug use: No     Allergies   Codeine, Doxycycline , and Neosporin [neomycin-bacitracin zn-polymyx]   Review of Systems Review of Systems  Constitutional:  Positive for fever.  All other systems reviewed and are negative.    Physical Exam Triage Vital Signs ED Triage Vitals  Encounter Vitals Group     BP 03/04/24 1737 124/85     Girls Systolic BP Percentile --      Girls Diastolic BP Percentile --      Boys Systolic BP Percentile --      Boys Diastolic BP Percentile --      Pulse Rate 03/04/24 1737 83     Resp 03/04/24 1737 18     Temp 03/04/24 1737 97.7 F (36.5 C)     Temp src --      SpO2 03/04/24 1737 95 %     Weight --      Height --      Head Circumference --      Peak Flow --      Pain Score 03/04/24 1750 4     Pain Loc --      Pain Education --      Exclude from Growth Chart --    No data found.  Updated Vital Signs BP 124/85   Pulse 83   Temp 97.7 F (36.5 C)   Resp 18   SpO2 95%   Visual Acuity Right Eye Distance:   Left Eye Distance:   Bilateral Distance:    Right Eye Near:   Left Eye Near:    Bilateral Near:     Physical Exam Vitals and nursing note  reviewed.  Constitutional:      Appearance: She is well-developed.  HENT:     Head: Normocephalic.     Right Ear: Tympanic membrane normal.     Left Ear: Tympanic membrane normal.     Nose: No rhinorrhea.     Mouth/Throat:     Mouth: Mucous membranes are moist.     Pharynx: Posterior oropharyngeal erythema present.  Cardiovascular:     Rate and Rhythm: Normal rate.  Pulmonary:     Effort: Pulmonary effort is normal.  Abdominal:     General: There is no distension.  Musculoskeletal:        General: Normal range of motion.     Cervical back: Normal range of motion.  Skin:    General: Skin is warm.  Neurological:     General: No focal deficit present.     Mental Status: She is alert and oriented to person, place, and time.      UC Treatments / Results  Labs (all labs ordered are listed, but only abnormal results are displayed) Labs Reviewed  POCT RAPID STREP A (OFFICE)    EKG   Radiology No results found.  Procedures Procedures (including critical care time)  Medications Ordered in UC Medications - No data to display  Initial Impression / Assessment and Plan / UC Course  I have reviewed the triage vital signs and the nursing notes.  Pertinent labs & imaging results that were available during my care of the patient were reviewed by me and considered in my medical decision making (see chart for details).     Strep screen is negative.  Patient is counseled on symptomatic treatment. Final Clinical Impressions(s) / UC Diagnoses   Final diagnoses:  Acute pharyngitis,  unspecified etiology     Discharge Instructions      Return if any problems.    ED Prescriptions   None    PDMP not reviewed this encounter. An After Visit Summary was printed and given to the patient.       Flint Sonny POUR, PA-C 03/04/24 1818

## 2024-03-04 NOTE — Discharge Instructions (Addendum)
 Return if any problems.

## 2024-03-04 NOTE — ED Triage Notes (Signed)
 Patient to Urgent Care with complaints of sore throat/ fevers.  Symptoms started Friday.
